# Patient Record
Sex: Female | Born: 1950
Health system: Southern US, Community
[De-identification: ages and names within clinical notes are randomized; demographics above are authoritative.]

## PROBLEM LIST (undated history)

## (undated) DIAGNOSIS — E1129 Type 2 diabetes mellitus with other diabetic kidney complication: Secondary | ICD-10-CM

## (undated) DIAGNOSIS — R809 Proteinuria, unspecified: Secondary | ICD-10-CM

## (undated) DIAGNOSIS — I110 Hypertensive heart disease with heart failure: Secondary | ICD-10-CM

## (undated) DIAGNOSIS — I509 Heart failure, unspecified: Secondary | ICD-10-CM

## (undated) DIAGNOSIS — Z950 Presence of cardiac pacemaker: Secondary | ICD-10-CM

## (undated) DIAGNOSIS — G5600 Carpal tunnel syndrome, unspecified upper limb: Secondary | ICD-10-CM

## (undated) DIAGNOSIS — M1990 Unspecified osteoarthritis, unspecified site: Secondary | ICD-10-CM

## (undated) DIAGNOSIS — Z7901 Long term (current) use of anticoagulants: Secondary | ICD-10-CM

## (undated) DIAGNOSIS — M779 Enthesopathy, unspecified: Secondary | ICD-10-CM

## (undated) DIAGNOSIS — J302 Other seasonal allergic rhinitis: Secondary | ICD-10-CM

## (undated) DIAGNOSIS — G4733 Obstructive sleep apnea (adult) (pediatric): Secondary | ICD-10-CM

## (undated) DIAGNOSIS — G4730 Sleep apnea, unspecified: Secondary | ICD-10-CM

## (undated) DIAGNOSIS — F419 Anxiety disorder, unspecified: Secondary | ICD-10-CM

## (undated) DIAGNOSIS — K439 Ventral hernia without obstruction or gangrene: Secondary | ICD-10-CM

## (undated) DIAGNOSIS — K591 Functional diarrhea: Secondary | ICD-10-CM

## (undated) DIAGNOSIS — N183 Chronic kidney disease, stage 3 (moderate): Secondary | ICD-10-CM

## (undated) DIAGNOSIS — J40 Bronchitis, not specified as acute or chronic: Secondary | ICD-10-CM

## (undated) DIAGNOSIS — E669 Obesity, unspecified: Secondary | ICD-10-CM

## (undated) DIAGNOSIS — I10 Essential (primary) hypertension: Secondary | ICD-10-CM

## (undated) DIAGNOSIS — J449 Chronic obstructive pulmonary disease, unspecified: Secondary | ICD-10-CM

## (undated) DIAGNOSIS — N951 Menopausal and female climacteric states: Secondary | ICD-10-CM

## (undated) DIAGNOSIS — R609 Edema, unspecified: Secondary | ICD-10-CM

## (undated) DIAGNOSIS — K210 Gastro-esophageal reflux disease with esophagitis: Secondary | ICD-10-CM

## (undated) DIAGNOSIS — E119 Type 2 diabetes mellitus without complications: Secondary | ICD-10-CM

## (undated) DIAGNOSIS — E785 Hyperlipidemia, unspecified: Secondary | ICD-10-CM

## (undated) DIAGNOSIS — I5032 Chronic diastolic (congestive) heart failure: Secondary | ICD-10-CM

## (undated) DIAGNOSIS — J309 Allergic rhinitis, unspecified: Secondary | ICD-10-CM

## (undated) DIAGNOSIS — K589 Irritable bowel syndrome without diarrhea: Secondary | ICD-10-CM

## (undated) DIAGNOSIS — I4891 Unspecified atrial fibrillation: Secondary | ICD-10-CM

## (undated) DIAGNOSIS — E039 Hypothyroidism, unspecified: Secondary | ICD-10-CM

## (undated) DIAGNOSIS — D649 Anemia, unspecified: Secondary | ICD-10-CM

## (undated) DIAGNOSIS — E663 Overweight: Secondary | ICD-10-CM

## (undated) DIAGNOSIS — M792 Neuralgia and neuritis, unspecified: Secondary | ICD-10-CM

## (undated) DIAGNOSIS — F17200 Nicotine dependence, unspecified, uncomplicated: Secondary | ICD-10-CM

## (undated) DIAGNOSIS — H8110 Benign paroxysmal vertigo, unspecified ear: Secondary | ICD-10-CM

## (undated) DIAGNOSIS — Z8719 Personal history of other diseases of the digestive system: Secondary | ICD-10-CM

## (undated) DIAGNOSIS — R0789 Other chest pain: Secondary | ICD-10-CM

## (undated) DIAGNOSIS — S82843A Displaced bimalleolar fracture of unspecified lower leg, initial encounter for closed fracture: Secondary | ICD-10-CM

## (undated) DIAGNOSIS — M545 Low back pain: Secondary | ICD-10-CM

## (undated) DIAGNOSIS — I480 Paroxysmal atrial fibrillation: Secondary | ICD-10-CM

## (undated) DIAGNOSIS — F331 Major depressive disorder, recurrent, moderate: Secondary | ICD-10-CM

## (undated) DIAGNOSIS — J329 Chronic sinusitis, unspecified: Secondary | ICD-10-CM

## (undated) DIAGNOSIS — E1165 Type 2 diabetes mellitus with hyperglycemia: Secondary | ICD-10-CM

## (undated) DIAGNOSIS — M546 Pain in thoracic spine: Secondary | ICD-10-CM

## (undated) DIAGNOSIS — I495 Sick sinus syndrome: Secondary | ICD-10-CM

## (undated) DIAGNOSIS — K219 Gastro-esophageal reflux disease without esophagitis: Secondary | ICD-10-CM

## (undated) DIAGNOSIS — K2900 Acute gastritis without bleeding: Secondary | ICD-10-CM

## (undated) DIAGNOSIS — R51 Headache: Secondary | ICD-10-CM

## (undated) HISTORY — DX: Neuralgia and neuritis, unspecified: M79.2

## (undated) HISTORY — DX: Personal history of other diseases of the digestive system: Z87.19

## (undated) HISTORY — DX: Type 2 diabetes mellitus without complications: E11.9

## (undated) HISTORY — DX: Chronic diastolic (congestive) heart failure: I50.32

## (undated) HISTORY — DX: Enthesopathy, unspecified: M77.9

## (undated) HISTORY — DX: Hyperlipidemia, unspecified: E78.5

## (undated) HISTORY — DX: Displaced bimalleolar fracture of unspecified lower leg, initial encounter for closed fracture: S82.843A

## (undated) HISTORY — DX: Overweight: E66.3

## (undated) HISTORY — DX: Essential (primary) hypertension: I10

## (undated) HISTORY — PX: CHOLECYSTECTOMY: SHX55

## (undated) HISTORY — DX: Sick sinus syndrome: I49.5

## (undated) HISTORY — DX: Obesity, unspecified: E66.9

## (undated) HISTORY — DX: Benign paroxysmal vertigo, unspecified ear: H81.10

## (undated) HISTORY — DX: Paroxysmal atrial fibrillation: I48.0

## (undated) HISTORY — DX: Hypothyroidism, unspecified: E03.9

## (undated) HISTORY — DX: Low back pain: M54.5

## (undated) HISTORY — DX: Chronic obstructive pulmonary disease, unspecified: J44.9

## (undated) HISTORY — DX: Long term (current) use of anticoagulants: Z79.01

## (undated) HISTORY — DX: Bronchitis, not specified as acute or chronic: J40

## (undated) HISTORY — PX: TUBAL LIGATION: SHX77

## (undated) HISTORY — DX: Chronic kidney disease, stage 3 (moderate): N18.3

## (undated) HISTORY — DX: Functional diarrhea: K59.1

## (undated) HISTORY — DX: Pain in thoracic spine: M54.6

## (undated) HISTORY — DX: Allergic rhinitis, unspecified: J30.9

## (undated) HISTORY — PX: CATARACT EXTRACTION W/ INTRAOCULAR LENS IMPLANT: SHX1309

## (undated) HISTORY — DX: Type 2 diabetes mellitus with hyperglycemia: E11.65

## (undated) HISTORY — DX: Major depressive disorder, recurrent, moderate: F33.1

## (undated) HISTORY — DX: Acute gastritis without bleeding: K29.00

## (undated) HISTORY — DX: Chronic sinusitis, unspecified: J32.9

## (undated) HISTORY — DX: Anxiety disorder, unspecified: F41.9

## (undated) HISTORY — DX: Edema, unspecified: R60.9

## (undated) HISTORY — PX: HERNIA REPAIR: SHX51

## (undated) HISTORY — DX: Menopausal and female climacteric states: N95.1

## (undated) HISTORY — DX: Hypertensive heart disease with heart failure: I11.0

## (undated) HISTORY — DX: Other chest pain: R07.89

## (undated) HISTORY — DX: Irritable bowel syndrome without diarrhea: K58.9

## (undated) HISTORY — DX: Gastro-esophageal reflux disease with esophagitis: K21.0

## (undated) HISTORY — DX: Anemia, unspecified: D64.9

## (undated) HISTORY — DX: Headache: R51

## (undated) HISTORY — PX: CARPAL TUNNEL RELEASE: SHX101

## (undated) HISTORY — DX: Presence of cardiac pacemaker: Z95.0

## (undated) HISTORY — DX: Obstructive sleep apnea (adult) (pediatric): G47.33

## (undated) HISTORY — DX: Nicotine dependence, unspecified, uncomplicated: F17.200

## (undated) HISTORY — DX: Type 2 diabetes mellitus with other diabetic kidney complication: E11.29

## (undated) HISTORY — DX: Unspecified osteoarthritis, unspecified site: M19.90

## (undated) HISTORY — DX: Other seasonal allergic rhinitis: J30.2

## (undated) HISTORY — DX: Ventral hernia without obstruction or gangrene: K43.9

## (undated) HISTORY — DX: Carpal tunnel syndrome, unspecified upper limb: G56.00

## (undated) HISTORY — DX: Unspecified atrial fibrillation: I48.91

## (undated) HISTORY — DX: Proteinuria, unspecified: R80.9

## (undated) HISTORY — PX: TOTAL HIP ARTHROPLASTY: SHX124

---

## 1998-12-25 ENCOUNTER — Other Ambulatory Visit: Admission: RE | Admit: 1998-12-25 | Discharge: 1998-12-25 | Payer: Self-pay | Admitting: Gynecology

## 1999-01-12 ENCOUNTER — Other Ambulatory Visit: Admission: RE | Admit: 1999-01-12 | Discharge: 1999-01-12 | Payer: Self-pay | Admitting: Gynecology

## 1999-01-12 ENCOUNTER — Encounter (INDEPENDENT_AMBULATORY_CARE_PROVIDER_SITE_OTHER): Payer: Self-pay | Admitting: Specialist

## 2003-06-26 ENCOUNTER — Ambulatory Visit (HOSPITAL_COMMUNITY): Admission: RE | Admit: 2003-06-26 | Discharge: 2003-06-26 | Payer: Self-pay | Admitting: Orthopedic Surgery

## 2003-12-06 ENCOUNTER — Observation Stay (HOSPITAL_COMMUNITY): Admission: RE | Admit: 2003-12-06 | Discharge: 2003-12-07 | Payer: Self-pay | Admitting: Orthopedic Surgery

## 2008-11-27 ENCOUNTER — Ambulatory Visit: Payer: Self-pay | Admitting: Cardiovascular Disease

## 2008-11-27 ENCOUNTER — Inpatient Hospital Stay (HOSPITAL_COMMUNITY): Admission: AD | Admit: 2008-11-27 | Discharge: 2008-11-29 | Payer: Self-pay | Admitting: Cardiology

## 2008-11-27 DIAGNOSIS — I480 Paroxysmal atrial fibrillation: Secondary | ICD-10-CM

## 2008-11-27 DIAGNOSIS — I4891 Unspecified atrial fibrillation: Secondary | ICD-10-CM

## 2008-11-27 HISTORY — DX: Unspecified atrial fibrillation: I48.91

## 2008-11-27 HISTORY — DX: Paroxysmal atrial fibrillation: I48.0

## 2008-11-28 ENCOUNTER — Encounter: Payer: Self-pay | Admitting: Cardiology

## 2008-12-03 ENCOUNTER — Ambulatory Visit: Payer: Self-pay | Admitting: Cardiology

## 2008-12-03 ENCOUNTER — Encounter: Payer: Self-pay | Admitting: Cardiology

## 2008-12-09 ENCOUNTER — Ambulatory Visit: Payer: Self-pay | Admitting: Internal Medicine

## 2008-12-16 ENCOUNTER — Ambulatory Visit: Payer: Self-pay | Admitting: Cardiovascular Disease

## 2008-12-23 ENCOUNTER — Ambulatory Visit: Payer: Self-pay | Admitting: Cardiology

## 2008-12-24 ENCOUNTER — Ambulatory Visit: Payer: Self-pay | Admitting: Cardiovascular Disease

## 2009-01-02 ENCOUNTER — Telehealth: Payer: Self-pay | Admitting: Cardiovascular Disease

## 2009-01-02 ENCOUNTER — Ambulatory Visit: Payer: Self-pay | Admitting: Cardiology

## 2009-01-03 ENCOUNTER — Ambulatory Visit: Payer: Self-pay | Admitting: Internal Medicine

## 2009-01-03 ENCOUNTER — Ambulatory Visit (HOSPITAL_COMMUNITY): Admission: RE | Admit: 2009-01-03 | Discharge: 2009-01-03 | Payer: Self-pay | Admitting: Internal Medicine

## 2009-01-07 ENCOUNTER — Ambulatory Visit: Payer: Self-pay | Admitting: Cardiovascular Disease

## 2009-01-22 ENCOUNTER — Ambulatory Visit: Payer: Self-pay | Admitting: Cardiovascular Disease

## 2009-01-31 ENCOUNTER — Ambulatory Visit: Payer: Self-pay | Admitting: Cardiovascular Disease

## 2009-01-31 LAB — CONVERTED CEMR LAB: POC INR: 1.7

## 2009-02-14 ENCOUNTER — Ambulatory Visit: Payer: Self-pay | Admitting: Cardiology

## 2009-02-19 ENCOUNTER — Ambulatory Visit: Payer: Self-pay | Admitting: Cardiovascular Disease

## 2009-02-20 ENCOUNTER — Telehealth: Payer: Self-pay | Admitting: Cardiovascular Disease

## 2009-03-07 ENCOUNTER — Ambulatory Visit: Payer: Self-pay | Admitting: Internal Medicine

## 2009-03-07 ENCOUNTER — Encounter (INDEPENDENT_AMBULATORY_CARE_PROVIDER_SITE_OTHER): Payer: Self-pay | Admitting: Cardiology

## 2009-03-07 ENCOUNTER — Ambulatory Visit: Payer: Self-pay | Admitting: Cardiovascular Disease

## 2009-03-07 LAB — CONVERTED CEMR LAB: POC INR: 2.9

## 2009-03-21 ENCOUNTER — Ambulatory Visit: Payer: Self-pay | Admitting: Cardiovascular Disease

## 2009-04-04 ENCOUNTER — Telehealth (INDEPENDENT_AMBULATORY_CARE_PROVIDER_SITE_OTHER): Payer: Self-pay | Admitting: Pharmacist

## 2009-04-04 ENCOUNTER — Ambulatory Visit: Payer: Self-pay | Admitting: Cardiology

## 2009-04-04 LAB — CONVERTED CEMR LAB: POC INR: 2.2

## 2009-06-11 ENCOUNTER — Telehealth: Payer: Self-pay | Admitting: Cardiovascular Disease

## 2009-06-20 ENCOUNTER — Ambulatory Visit: Payer: Self-pay | Admitting: Cardiovascular Disease

## 2009-09-02 ENCOUNTER — Ambulatory Visit: Payer: Self-pay | Admitting: Cardiovascular Disease

## 2010-02-24 NOTE — Progress Notes (Signed)
  Call back at EKG done in office [id number]-notified Dr. Freida Busman, Advised to continue Metoprolol 50mg  Bid and call office if any palpitations.

## 2010-02-24 NOTE — Progress Notes (Signed)
  Phone Note Call from Patient   Caller: Patient Call For: Coumadin Clinic Summary of Call: Pt calls to inform us of a new abx.  She has been prescribed a 10 day course of Cefdinir.  She is also taking Norel SR, a combination product for cold symptoms.  I do not forsee these medications being an issue with her coumadin. She has been instructed to continue as prescribed and to follow-up in one month.   Initial call taken by: Eda Keys, PharmD

## 2010-02-24 NOTE — Medication Information (Signed)
Summary: rov.m  Anticoagulant Therapy  Managed by: Inactive Referring MD: Raynelle Bring, MD Supervising MD: Myrtis Ser MD, Tinnie Gens Indication 1: Atrial Fibrillation Lab Used: [facility] [place] Site: [address] INR POC 2.2 INR RANGE 2.0-3.0  Dietary changes: no    Health status changes: no    Bleeding/hemorrhagic complications: no    Recent/future hospitalizations: no    Any changes in medication regimen? yes       Details: Currently on abx for sinus congestion/infection.  Started on Wed 04/02/09.    Recent/future dental: no  Any missed doses?: no       Is patient compliant with meds? yes      Comments: Pt is going to start following PT/INR in  with her primary MD Dr Keturah Barre at [facility].  He has agreed to follow.    Allergies (verified): 1)  ! Vicodin  Anticoagulation Management History:      The patient is taking warfarin and comes in today for a routine follow up visit.  Negative risk factors for bleeding include an age less than 91 years old.  The bleeding index is 'low risk'.  Negative CHADS2 values include Age > 52 years old.  Anticoagulation responsible provider: Myrtis Ser MD, Tinnie Gens.  INR POC: 2.2.  Cuvette Lot#: [id number].  Exp: 05/2010.    Anticoagulation Management Assessment/Plan:      The patient's current anticoagulation dose is Warfarin sodium 5 mg tabs: Use as directed by Anticoagulation Clinic.  The target INR is 2.0-3.0.  The next INR is due 05/02/2009.  Anticoagulation instructions were given to patient.  Results were reviewed/authorized by Inactive.  She was notified by Cloyde Reams RN.         Prior Anticoagulation Instructions: INR 2.9  Take 1 tab tomorrow, then resume 1 tab daily except 1.5 tabs each Tuesday, Thursday, Saturday.  Recheck in 4 weeks.    Current Anticoagulation Instructions: INR 2.2  Continue on same dosage 1 tablet daily except 1.5 tablets on Tuesdays, Thursdays, and Saturdays. Call us with the name of abx  you are currently taking.   Recheck in 4 weeks.

## 2010-02-24 NOTE — Letter (Signed)
Summary: Handout Printed  Printed Handout:  - Coumadin Instructions-w/out Meds 

## 2010-02-24 NOTE — Medication Information (Signed)
Summary: ROV/LB  Anticoagulant Therapy  Managed by: Cloyde Reams, RN, BSN Referring MD: Raynelle Bring, MD Supervising MD: Eden Emms MD, Theron Arista Indication 1: Atrial Fibrillation Lab Used: [facility] [place] Site: [address] INR POC 1.7 INR RANGE 2.0-3.0  Dietary changes: no    Health status changes: no    Bleeding/hemorrhagic complications: no    Recent/future hospitalizations: no    Any changes in medication regimen? no    Recent/future dental: no  Any missed doses?: yes     Details: Missed 5mg  Sunday. Took an extra 2.5mg  on Monday and Wednesday to make up for missed dose.  Is patient compliant with meds? yes       Allergies: 1)  ! Vicodin  Anticoagulation Management History:      The patient is taking warfarin and comes in today for a routine follow up visit.  Negative risk factors for bleeding include an age less than 8 years old.  The bleeding index is 'low risk'.  Negative CHADS2 values include Age > 54 years old.  Anticoagulation responsible provider: Eden Emms MD, Theron Arista.  INR POC: 1.7.  Cuvette Lot#: [id number].  Exp: 02/2010.    Anticoagulation Management Assessment/Plan:      The patient's current anticoagulation dose is Warfarin sodium 5 mg tabs: Use as directed by Anticoagulation Clinic.  The target INR is 2.0-3.0.  The next INR is due 02/14/2009.  Anticoagulation instructions were given to patient.  Results were reviewed/authorized by Cloyde Reams, RN, BSN.  She was notified by Lew Dawes, PharmD Candidate.         Prior Anticoagulation Instructions: INR 2.1 CONTINUE TAKING 1 TABLET EVERYDAY EXCEPT TAKE 1.5 TABLETS ON TUESDAYS AND SATURDAYS.  Current Anticoagulation Instructions: INR 1.7  Take 1.5 tablets today then increase dose to1 tablet daily except 1.5 tablets Tuesday, Thursday, and Saturday. Recheck in 2 weeks.

## 2010-02-24 NOTE — Progress Notes (Signed)
Summary: pres refill  Phone Note Call from Patient Message from:  Patient on Jun 11, 2009 11:03 AM  Initial call taken by: Park Breed,  Jun 11, 2009 11:04 AM Summary of Call: pt needs prescription for Metoprolol 50 mg two times a day called to [facility] [phone]  Tab/Tar Initial call taken by: Park Breed,  Jun 11, 2009 11:05 AM  Follow-up for Phone Call        Rx Called In: Rx faxed in to [facility] for Metoprolol Tartrate 50mg  Bid #[id number] Follow-up by: Dessie Coma LPN

## 2010-02-24 NOTE — Medication Information (Signed)
Summary: rov/ewj  Anticoagulant Therapy  Managed by: Shelby Dubin, PharmD, BCPS, CPP Referring MD: Raynelle Bring, MD Supervising MD: Tenny Craw MD, Gunnar Fusi Indication 1: Atrial Fibrillation Lab Used: [facility] [place] Site: [address] INR POC 2.9 INR RANGE 2.0-3.0  Dietary changes: no    Health status changes: no    Bleeding/hemorrhagic complications: no    Recent/future hospitalizations: no    Any changes in medication regimen? no    Recent/future dental: no  Any missed doses?: no       Is patient compliant with meds? yes      Comments: Monitor today...  Allergies (verified): 1)  ! Vicodin  Anticoagulation Management History:      The patient is taking warfarin and comes in today for a routine follow up visit.  Negative risk factors for bleeding include an age less than 49 years old.  The bleeding index is 'low risk'.  Negative CHADS2 values include Age > 84 years old.  Anticoagulation responsible provider: Tenny Craw MD, Gunnar Fusi.  INR POC: 2.9.  Cuvette Lot#: [id number].  Exp: 04/2010.    Anticoagulation Management Assessment/Plan:      The patient's current anticoagulation dose is Warfarin sodium 5 mg tabs: Use as directed by Anticoagulation Clinic.  The target INR is 2.0-3.0.  The next INR is due 04/04/2009.  Anticoagulation instructions were given to patient.  Results were reviewed/authorized by Shelby Dubin, PharmD, BCPS, CPP.  She was notified by Shelby Dubin PharmD, BCPS, CPP.         Prior Anticoagulation Instructions: INR 2.4  Continue on same dosage 1 tablet daily except 1.5 tablets on Tuesdays, Thursdays, and Saturdays.   Recheck in 3 weeks.    Current Anticoagulation Instructions: INR 2.9  Take 1 tab tomorrow, then resume 1 tab daily except 1.5 tabs each Tuesday, Thursday, Saturday.  Recheck in 4 weeks.

## 2010-02-24 NOTE — Procedures (Signed)
Summary: summary report  summary report   Imported By: Mirna Mires 03/11/2009 09:29:59  _____________________________________________________________________  External Attachment:    Type:   Image     Comment:   External Document

## 2010-02-24 NOTE — Medication Information (Signed)
Summary: rov/eh  Anticoagulant Therapy  Managed by: Cloyde Reams, RN, BSN Referring MD: Raynelle Bring, MD Supervising MD: Daleen Squibb MD, Maisie Fus Indication 1: Atrial Fibrillation Lab Used: [facility] [place] Site: [address] INR POC 2.4 INR RANGE 2.0-3.0  Dietary changes: no    Health status changes: no    Bleeding/hemorrhagic complications: no    Recent/future hospitalizations: no    Any changes in medication regimen? yes       Details: Incr Metformin to 1000mg  bid.  Recent/future dental: no  Any missed doses?: no       Is patient compliant with meds? yes       Allergies (verified): 1)  ! Vicodin  Anticoagulation Management History:      The patient is taking warfarin and comes in today for a routine follow up visit.  Negative risk factors for bleeding include an age less than 69 years old.  The bleeding index is 'low risk'.  Negative CHADS2 values include Age > 88 years old.  Anticoagulation responsible provider: Daleen Squibb MD, Maisie Fus.  INR POC: 2.4.  Cuvette Lot#: [id number].  Exp: 04/2010.    Anticoagulation Management Assessment/Plan:      The patient's current anticoagulation dose is Warfarin sodium 5 mg tabs: Use as directed by Anticoagulation Clinic.  The target INR is 2.0-3.0.  The next INR is due 03/07/2009.  Anticoagulation instructions were given to patient.  Results were reviewed/authorized by Cloyde Reams, RN, BSN.  She was notified by Cloyde Reams RN.         Prior Anticoagulation Instructions: INR 1.7  Take 1.5 tablets today then increase dose to1 tablet daily except 1.5 tablets Tuesday, Thursday, and Saturday. Recheck in 2 weeks.  Current Anticoagulation Instructions: INR 2.4  Continue on same dosage 1 tablet daily except 1.5 tablets on Tuesdays, Thursdays, and Saturdays.   Recheck in 3 weeks.

## 2010-04-28 LAB — PROTIME-INR
INR: 1.18 (ref 0.00–1.49)
Prothrombin Time: 14.9 seconds (ref 11.6–15.2)

## 2010-04-28 LAB — BASIC METABOLIC PANEL
BUN: 22 mg/dL (ref 6–23)
CO2: 24 mEq/L (ref 19–32)
Calcium: 8.3 mg/dL — ABNORMAL LOW (ref 8.4–10.5)
Chloride: 105 mEq/L (ref 96–112)
Creatinine, Ser: 0.84 mg/dL (ref 0.4–1.2)
Glucose, Bld: 120 mg/dL — ABNORMAL HIGH (ref 70–99)
Potassium: 3.3 mEq/L — ABNORMAL LOW (ref 3.5–5.1)
Sodium: 137 mEq/L (ref 135–145)

## 2010-04-28 LAB — CBC: RBC: 4.41 MIL/uL (ref 3.87–5.11)

## 2010-04-29 LAB — CBC
HCT: 38.3 % (ref 36.0–46.0)
Hemoglobin: 13.5 g/dL (ref 12.0–15.0)
Hemoglobin: 13.9 g/dL (ref 12.0–15.0)
MCHC: 34.9 g/dL (ref 30.0–36.0)
MCHC: 35.2 g/dL (ref 30.0–36.0)
MCV: 90.9 fL (ref 78.0–100.0)
MCV: 91.6 fL (ref 78.0–100.0)
Platelets: 217 10*3/uL (ref 150–400)
Platelets: 251 10*3/uL (ref 150–400)
RBC: 4.2 MIL/uL (ref 3.87–5.11)
RBC: 4.35 MIL/uL (ref 3.87–5.11)
RDW: 12.9 % (ref 11.5–15.5)
RDW: 13 % (ref 11.5–15.5)
WBC: 8 10*3/uL (ref 4.0–10.5)

## 2010-04-29 LAB — HEPARIN LEVEL (UNFRACTIONATED)
Heparin Unfractionated: 0.19 IU/mL — ABNORMAL LOW (ref 0.30–0.70)
Heparin Unfractionated: 0.65 IU/mL (ref 0.30–0.70)

## 2010-04-29 LAB — COMPREHENSIVE METABOLIC PANEL
ALT: 20 U/L (ref 0–35)
Albumin: 3.6 g/dL (ref 3.5–5.2)
Alkaline Phosphatase: 106 U/L (ref 39–117)
CO2: 22 mEq/L (ref 19–32)
Calcium: 9 mg/dL (ref 8.4–10.5)
GFR calc Af Amer: >60 mL/min (ref >60)
Glucose, Bld: 243 mg/dL — ABNORMAL HIGH (ref 70–99)
Potassium: 3.4 mEq/L — ABNORMAL LOW (ref 3.5–5.1)
Sodium: 137 mEq/L (ref 135–145)
Total Bilirubin: 1.3 mg/dL — ABNORMAL HIGH (ref 0.3–1.2)
Total Protein: 6.4 g/dL (ref 6.0–8.3)

## 2010-04-29 LAB — GLUCOSE, CAPILLARY
Glucose-Capillary: 138 mg/dL — ABNORMAL HIGH (ref 70–99)
Glucose-Capillary: 199 mg/dL — ABNORMAL HIGH (ref 70–99)

## 2010-04-29 LAB — PROTIME-INR
Prothrombin Time: 13 seconds (ref 11.6–15.2)
Prothrombin Time: 13.7 seconds (ref 11.6–15.2)
Prothrombin Time: 14.2 seconds (ref 11.6–15.2)

## 2010-04-29 LAB — CARDIAC PANEL(CRET KIN+CKTOT+MB+TROPI)
CK, MB: 2.6 ng/mL (ref 0.3–4.0)
Relative Index: 2.1 (ref 0.0–2.5)
Troponin I: 0.01 ng/mL (ref 0.00–0.06)

## 2010-04-29 LAB — BASIC METABOLIC PANEL
BUN: 16 mg/dL (ref 6–23)
Calcium: 8.5 mg/dL (ref 8.4–10.5)
Creatinine, Ser: 0.81 mg/dL (ref 0.4–1.2)
GFR calc Af Amer: >60 mL/min (ref >60)

## 2010-04-29 LAB — TSH: TSH: 3.868 u[IU]/mL (ref 0.350–4.500)

## 2010-04-29 LAB — APTT: aPTT: 26 seconds (ref 24–37)

## 2010-06-09 NOTE — Assessment & Plan Note (Signed)
[facility]                        [place] CARDIOLOGY OFFICE NOTE   NAME:Carolyn [name], Carolyn [name]                     MRN:          [id number]  DATE:02/19/2009                            DOB:          07-Jan-1951    PROBLEM LIST:  1. Paroxysmal atrial fibrillation.  2. Hypertension.  3. Type 2 diabetes.  4. Hyperlipidemia.  5. Tobacco use.  6. Obesity.  7. Hypothyroidism after thyroid ablation in 2005.  8. History of carpal tunnel syndrome.   INTERVAL HISTORY:  The patient states since her last visit, she has been  doing well.  She feels palpitations maybe 1 time a week.  She states  sometimes it can last a few minutes to several hours.  Today in clinic,  she is feeling some palpitations.  She denies any chest comfort other  than the palpitations and has been compliant with her medications  including her Coumadin.   PHYSICAL EXAMINATION:  VITAL SIGNS:  Today, her blood pressure is  156/104 in the right arm and 147/93 in the left arm, pulse is 76, sating  97% on room air, and she weighs 236 pounds.  GENERAL:  She is in no acute distress.  HEENT:  Normocephalic, atraumatic.  HEART:  Irregularly irregular and tachycardia.  No murmur or rub.  LUNGS:  Clear bilaterally.  ABDOMEN:  Soft, nontender, nondistended.  EXTREMITIES:  Without edema.  SKIN:  Warm and dry.    EKG taken today in clinic demonstrates atrial fibrillation with  ventricular response of 138 beats per minute.   ASSESSMENT AND PLAN:  The patient presented in atrial fibrillation with  rapid ventricular response.  Evidently, the patient did not take her  Toprol dose this morning after work.  We gave her her dose of Toprol-XL  50 mg here in clinic and after half hour did not see a significant  improvement; therefore, an additional 50 mg of Toprol-XL was given.  The  patient's rhythm remained atrial fibrillation and heart rate decreased  to around 110 with occasional runs of sinus rhythm in  the 80s.  We  suggested the patient to go to the emergency room for more definitive  treatment of the tachycardia.  The patient declined this.  We recommend  the patient go home and rest.  She should take an additional 50 mg of  Toprol-XL this evening and start taking that dose b.i.d.  She will come  to clinic tomorrow after her work for a repeat EKG.  If the patient were  to feel any dizziness or chest discomfort, she will report the emergency  room before that time.  Regarding hypertension, she will continue on  current meds and her Toprol will be increased  which will help with rest of her uncontrolled hypertension.  Regarding  the patient's lipids, the patient is currently attending classes at  [employer] [employer] [facility] and is having her medications titrated by her  primary care physician.     [name], MD  Electronically Signed    SGA/MedQ  DD: 02/19/2009  DT: 02/20/2009  Job #: [id number]

## 2010-06-09 NOTE — Assessment & Plan Note (Signed)
[facility]                        [place] CARDIOLOGY OFFICE NOTE   NAME:[name] [name] BURTIS                     MRN:          [id number]  DATE:11/27/2008                            DOB:          1950/08/24    HISTORY AND PHYSICAL   CHIEF COMPLAINT:  Shortness of breath and chest pain.   HISTORY OF PRESENT ILLNESS:  [name] [name] is a 60 year old white female  with past medical history significant for hyperthyroidism status post  thyroid ablation in 2005 with resulting hypothyroidism who is presenting  with a 1 month history of increased dyspnea and intermittent chest  discomfort.  The patient states that for the past month whether she is  exerting herself or at rest she has had increased levels of shortness of  breath.  She occasionally has palpitations and some episodes of  lightheadedness but denies any syncopal episodes.  The patient states  that over the past week or so she has also developed some chest  discomfort that is intermittent over the course of the day.  There is no  radiation of the chest discomfort although it is associated with the  dyspnea.  The patient states that around the time of her thyroid  ablation in 2004 or 2005 she had some issues with atrial fibrillation,  however, she is not aware of the arrhythmia recurring since then.  Approximately 1 week ago the patient reported to the emergency room with  these symptoms and was found to be in atrial fibrillation with rapid  ventricular response.  They performed a CT scan of the chest and ruled  out pulmonary embolism and set her up with an outpatient appointment.  She has not been on any rate controlling agents nor has she been on any  anticoagulants other than aspirin.   PAST MEDICAL HISTORY:  As above in the HPI.  In addition, the patient  has had a cholecystectomy, left hip surgery and has carpal tunnel  syndrome.   SOCIAL HISTORY:  She has a history of tobacco but quit smoking  a couple  weeks ago.  Does not drink significant amounts of alcohol.   FAMILY HISTORY:  Is positive for coronary disease but not for premature  coronary disease.   ALLERGIES:  The patient is allergic to HYDROCODONE.   MEDICATIONS:  1. Aspirin 81 mg daily.  2. Lexapro 20 mg daily.  3. Synthroid 150 mcg daily.  4. Omeprazole 20 mg daily.   REVIEW OF SYSTEMS:  Positive for lower extremity edema that has been  present for over a year.  She also endorses increased fatigue.  Other  systems as in HPI, otherwise negative.   PHYSICAL EXAM:  VITAL SIGNS:  Blood pressure is 156/82, heart rate 126,  her weight is 240 pounds and she is satting 98% on room air.  GENERAL:  She is in no acute distress.  HEENT:  Nonfocal.  NECK:  Neck is supple.  There is no JVD.  There are no carotid bruits.  There is no thyromegaly.  HEART:  Regular irregular and tachycardic without murmur, rub or gallop.  LUNGS:  Clear bilaterally.  ABDOMEN:  Soft, nontender, nondistended.  EXTREMITIES:  There is 1+ bilateral lower extremity edema.  SKIN:  There is some mild erythema on the distal portion of her lower  extremities.  Skin is otherwise warm and dry.  NEUROLOGICAL:  Nonfocal.  PSYCHIATRIC:  The patient is appropriate with normal levels of insight.  PULSES:  The patient has 2+ bilateral carotid and radial pulses.  MUSCULOSKELETAL:  The patient has 5/5 bilateral upper and lower  extremity strength.   LABS:  From the end of October shows a BMP with a sodium of 140,  potassium 3.7, chloride 107, CO2 25, BUN 10, creatinine 0.8, glucose  122.  LFTs were completely within normal limits other than a mildly  elevated alkaline phosphatase at 135.  Her CK was 196 and CK-MB was 2.4.  CBC showed a white count of 8.5, hemoglobin of 14, hematocrit of 41,  platelet count of 254.  UA was within normal limits.  TSH was 8.87 and  her total T4 was 7.9, troponin was 0.01.  EKG from today independently  interpreted by myself  demonstrates a narrow complex tachycardia with a  ventricular rate of 123 beats per minute.  There is organized atrial  activity at a rate of approximately 300 atrial beats per minute that  likely represents atrial flutter with variable block.   ASSESSMENT:  A 60 year old white female with hypothyroidism presenting  with symptomatic atrial flutter (and probable fibrillation) for at least  1 week and probably 1 month.  It is not clear whether this is paroxysmal  atrial fibrillation or whether she has maintained the rhythm throughout  this time period.  The patient does have thyroid disease as a possible  underlying etiology.   PLAN:  The patient will be directly admitted to [facility] for  evaluation and treatment site of this rhythm.  Dr. Myrtis Ser will be the  accepting physician.  She will be ruled out for myocardial infarction  and a TSH will be checked.  We will continue her above medications.  In  addition, we will start her on Lopressor 25 mg p.o. q.6 h and titrate  this for appropriate rate control.  If the patient remains atrial fib or  atrial flutter throughout her inpatient stay we will institute therapy  with Coumadin with the hopes of cardioverting her in 3-4 weeks.  We may  also have EP see the patient to see if she would be a candidate for  ablation.  The patient is having one of her relatives drive her to the  hospital.     [name], MD  Electronically Signed    SGA/MedQ  DD: 11/27/2008  DT: 11/27/2008  Job #: [id number]

## 2010-06-09 NOTE — Assessment & Plan Note (Signed)
[facility]                        [place] CARDIOLOGY OFFICE NOTE   NAME:Carolyn [name], Carolyn [name]                     MRN:          [id number]  DATE:06/20/2009                            DOB:          1950/08/23    PROBLEM LIST:  1. Paroxysmal atrial fibrillation/flutter.  2. Hypertension.  3. Type 2 diabetes.  4. Hyperlipidemia.  5. Tobacco use.  6. Obesity.  7. Hypothyroidism.  8. History of carpal tunnel syndrome.   INTERVAL HISTORY:  The patient states that she has been doing well since  her last visit, she has not had any episodes of palpitations or chest  pain.  She does have some issues with chronic back pain and some  occasional abdominal pain that may be secondary to irritable bowel.  She  has been compliant with her medications and is generally without  complaint.   PHYSICAL EXAMINATION:  VITAL SIGNS:  Her blood pressure is 118/71, pulse  61, sating 95% on room air, and she weighs 237 pounds which is 3 pounds  less than she weighed in February.  GENERAL:  No acute distress.  HEENT:  Normocephalic and atraumatic.  NECK:  Supple.  There is no carotid bruit.  No JVD.  HEART:  Regular rate and rhythm without murmur, rub, or gallop.  LUNGS:  Clear bilaterally.  ABDOMEN:  Soft, nontender, nondistended.  EXTREMITIES:  No edema.  SKIN:  Cool and dry.   LABORATORY DATA:  Labs were reviewed, dated May 29, 2009, total  cholesterol 175, HDL 38, triglycerides 279, and LDL of 81.  CBC was  within normal limits.  TSH was 0.314, this was followed by Dr. Sherral Hammers,  free T4 is 1.38 and a CMP was within normal limits except for mildly  elevated glucose of 113 and BUN 23 with a creatinine of 1.2.   ASSESSMENT AND PLAN:  1. Atrial fibrillation/flutter.  The patient does not appear to be      having any symptomatic episodes.  She will continue on Coumadin and      metoprolol.  2. Hypertension.  The patient's blood pressure is under excellent      control.   She should continue on HCTZ, benazepril, and metoprolol.  3. Hyperlipidemia.  The patient's LDL is reasonable.  However, she      continues to have elevated triglycerides.  We further discussed      about limiting carbohydrate intake and she will make more strides      in this direction.  If triglycerides remain elevated at the next      office visit, fibrates may be in order.  4. Diabetes as per Dr. Sherral Hammers.  5. Hypothyroidism as per Dr. Sherral Hammers.   PLAN:  The patient will follow up in 4 months' times.     [name], MD  Electronically Signed    SGA/MedQ  DD: 06/20/2009  DT: 06/20/2009  Job #: [id number]   cc:   Keturah Barre, M.D.

## 2010-06-09 NOTE — Assessment & Plan Note (Signed)
[facility]                        [place] CARDIOLOGY OFFICE NOTE   NAME:Carolyn [name], Carolyn [name]                     MRN:          [id number]  DATE:03/21/2009                            DOB:          May 29, 1950    PROBLEM LIST:  1. Paroxysmal atrial fibrillation.  2. Hypertension.  3. Type 2 diabetes.  4. Hyperlipidemia.  5. Tobacco use.  6. Obesity.  7. Hypothyroidism after thyroid ablation in 2005.  8. History of carpal tunnel.   INTERVAL HISTORY:  The patient states she has been getting along well  since her last visit.  She states in the past month she has had 1  episode where she felt her heart rate was high throughout the day.  Other than that, she denies any palpitations.  She has been attending  the diabetes education class and she has been attempting to make changes  in her dietary habits to address her diabetes, hypertriglyceridemia.  She had been having her INRs monitored and therefore stable.   On physical exam; her blood pressure is 123/85, pulse 66, sating 96% on  room air.  She weighs 240 pounds, which is 5 pounds more than she  weighed in January, but stable from November 2010.  General, no acute  distress.  HEENT; normocephalic, atraumatic.  Neck is supple.  Heart is  regular rate and rhythm without murmur, rub or gallop.  Lungs are clear  bilaterally.  Abdomen is soft and nontender.  Extremities have trace  lower extremity edema and skin is warm and dry.   EKG from today demonstrates sinus bradycardia with a ventricular rate of  57 beats per minute.  Review of the patient's Holter monitor that wore  for 48 hours shows normal sinus rhythm with sinus tachycardia with an  average heart rate of 17, minimum 52 maximum 121.  There is no atrial  fibrillation and there were occasional PACs.   ASSESSMENT AND PLAN:  1. Atrial fibrillation.  The patient appears to be having atrial      fibrillation infrequently.  Unfortunately, the date  that she did      have it she states that she feels that lasted most of the day.  She      should continue on metoprolol tartrate 50 mg twice a day as well as      Coumadin.  In the future if the paroxysms continue and are      affecting the patient's lifestyle, we would consider treatment with      Multaq.  2. Hypertension.  Blood pressure is under good control with      hydrochlorothiazide 25 mg, benazepril 10 mg daily as well as the      metoprolol.  3. Hyperlipidemia.  She is on simvastatin 40 mg daily.  She is      scheduled to have a blood work done in April Dr. Sherral Hammers office.      Of note, her last LDL was 84 in December 2010.  At that      time, her HDL was 39 and her triglyceride level was 263.  4. Diabetes as per  Dr. Sherral Hammers.  5. Hypothyroidism as per Dr. Sherral Hammers.     [name], MD  Electronically Signed    SGA/MedQ  DD: 03/21/2009  DT: 03/21/2009  Job #: [id number]

## 2010-06-09 NOTE — Assessment & Plan Note (Signed)
[facility]                        [place] CARDIOLOGY OFFICE NOTE   NAME:Carolyn [name], Carolyn [name]                     MRN:          [id number]  DATE:12/24/2008                            DOB:          1950/11/06    PROBLEM LIST:  1. Paroxysmal atrial fibrillation.  2. Hypertension.  3. Type 2 diabetes.  4. Hyperlipidemia.  5. Tobacco use.  6. Obesity.  7. Hypothyroidism after thyroid ablation in 2005.  8. History of carpal tunnel syndrome.   INTERVAL HISTORY:  At her last office visit, the patient was found to be  in AFib with rapid ventricular response and admitted to [facility] for  rate control.  There she was placed on diltiazem and spontaneously  converted to normal sinus rhythm.  She also underwent some diuresis for  and was felt to have a diastolic heart failure exacerbation.  The  patient states since her discharge, she has not had any major  complaints.  She has some tingling in her left hand that appears to be  new.  She also has had several episodes of sharp left-sided chest  discomfort that is new.  It does not radiate and there are no associated  symptoms.  She has been compliant with her medications including  Coumadin which she is having monitored at [facility] in  [place].  The patient made an inquiry to her insurance company  regarding Pradaxa therapy.  However, it would cost her approximately 60  dollars a month which is significantly more than the Coumadin, and she  states it is probably not worth the cost of it at this time.   REVIEW OF SYSTEMS:  As above, otherwise negative.   PHYSICAL EXAMINATION:  VITAL SIGNS:  Blood pressure is 108/73, pulse is  82, saturating 96% on room air.  She weighs 240 pounds.  GENERAL:  No acute distress.  HEENT:  Nonfocal.  NECK:  Supple.  HEART:  Regular rate and rhythm without murmur, rub, or gallop.  LUNGS:  Clear bilaterally.  ABDOMEN:  Soft, nontender, nondistended.  EXTREMITIES:   Without edema.  SKIN:  Warm and dry.   I reviewed the patient's hospital stay as above in the HPI.   ASSESSMENT AND PLAN:  1. Paroxysmal atrial fibrillation.  The patient is on metoprolol, and      she has not had any symptomatic palpitations.  She is currently      anticoagulated with Coumadin, and we will continue this for now.      In the future, the patient may consider switching to Pradaxa.  2. Hypertension.  Her blood pressure is under excellent control with      hydrochlorothiazide 25 mg daily, benazepril 5 mg daily, metoprolol      succinate 50 mg daily.  3. Hyperlipidemia.  The patient is on simvastatin 40 mg daily.  We      will order a fasting profile today.  4. Chest discomfort.  The patient's chest discomfort is atypical and      however, she does have significant risk factors for coronary artery      disease and therefore,  we will proceed with a Lexiscan Cardiolite      to rule out any significant inducible ischemia.  5. Diabetes.  The patient is on metformin 500 mg daily.  We will check      hemoglobin A1c but recommend that she see Dr. Sherral Hammers for titration      of the diabetes medications and diabetes education.  We will also      make a referral to nutritionist for diabetes education.  I am going      to see her back in 6 weeks' time.     [name], MD  Electronically Signed    SGA/MedQ  DD: 12/24/2008  DT: 12/25/2008  Job #: [id number]

## 2010-06-09 NOTE — Assessment & Plan Note (Signed)
[facility]                        [place] CARDIOLOGY OFFICE NOTE   NAME:Carolyn [name], Carolyn [name]                     MRN:          [id number]  DATE:09/02/2009                            DOB:          07/10/1950    PROBLEMS LIST:  1. Paroxysmal atrial fibrillation/flutter currently in sinus rhythm.  2. No history of coronary artery disease per cardiac cath in 2010.  3. Hypertension.  4. Type 2 diabetes.  5. Hyperlipidemia.  6. Tobacco use.  7. Obesity.  8. Hypothyroidism.  9. Carpal tunnel syndrome.   INTERIM HISTORY:  The patient is here today for a followup visit.  Overall, she has not had any significant episodes of palpitations.  It  seems like she has been maintaining in sinus rhythm.  Usually, she feels  the palpitations if she is in atrial fibrillation.  She denies any chest  pain or dyspnea.  There is no dizziness, presyncope, or syncope.   CURRENT MEDICATIONS:  1. Aspirin 81 mg once daily.  2. Lexapro 20 mg once daily.  3. Synthroid 0.15 mg once daily.  4. Omeprazole 20 mg once daily.  5. Hydrochlorothiazide 25 mg once daily.  6. Simvastatin 40 mg at bedtime.  7. Metformin 1000 mg twice daily.  8. Toprol-XL 50 mg twice daily.  9. Benazepril 10 mg once daily.  10.Pradaxa 150 mg twice daily which was started recently to replace      warfarin.   PHYSICAL EXAMINATION:  GENERAL:  The patient is in no acute distress.  VITAL SIGNS:  Weight of 242.6 pounds, blood pressure is 99/69, pulse is  68, and oxygen saturation is 96% on room air.  NECK:  No JVD or carotid bruits.  LUNGS:  Clear to auscultation.  CARDIOVASCULAR:  Regular rate and rhythm with no gallops or murmurs.  ABDOMEN:  Benign, nontender, nondistended.  EXTREMITIES:  With no clubbing, cyanosis, or edema.  SKIN:  Dry with no rash.   ASSESSMENT AND PLAN:  1. Paroxysmal atrial fibrillation/flutter:  The patient seems to be in      sinus rhythm and has not had any episodes of  palpitations since the      last visit.  We will continue with metoprolol for now.  If she      starts getting more episodes, we can consider an antiarrhythmic      medication.  I agree with long-term anticoagulation due to history      of hypertension and diabetes.  She was switched recently to Pradaxa      to substitute warfarin.  Given that she does not have any history      of coronary artery disease, I recommend that she stops aspirin      daily.  2. Hypertension.  Blood pressure is well-controlled.  3. Hyperlipidemia.  The patient does have history of elevated      triglycerides, but this might be improving now given that her      diabetes is under better control.  She is due in few months to have      her lipid profile checked.  We can consider adding a  fibrate      medication, but will only recommend that if triglyceride is      elevated to least to 200/250 given that multiple studies have shown      no great benefit adding a fibrate to a statin in diabetic patient's      and patient's with borderline elevated triglycerides.  The patient      will return in 6 months for followup or earlier if needed.     Lorine Bears, MD  Electronically Signed    MA/MedQ  DD: 09/02/2009  DT: 09/03/2009  Job #: [id number]

## 2010-06-12 NOTE — Op Note (Signed)
Carolyn [name], Carolyn [name]              ACCOUNT NO.:  [id number]   MEDICAL RECORD NO.:  [id number]          PATIENT TYPE:  OBV   LOCATION:  0443                         FACILITY:  [facility]   PHYSICIAN:  Georges Lynch. Gioffre, M.D.DATE OF BIRTH:  August 12, 1950   DATE OF PROCEDURE:  12/06/2003  DATE OF DISCHARGE:                                 OPERATIVE REPORT   SURGEON:  Dr. Darrelyn Hillock   ASSISTANT:  Terie Purser, PA-C   PREOPERATIVE DIAGNOSIS:  Chronic greater trochanteric bursitis with a  contracture of the iliotibial band, left hip.   POSTOPERATIVE DIAGNOSIS:  Chronic greater trochanteric bursitis with a  contracture of the iliotibial band, left hip.   OPERATION:  1.  Release of the iliotibial band.  2.  Excision of the greater trochanteric bursa, left hip.   DESCRIPTION OF PROCEDURE:  Under general anesthesia, the patient on her  right side up and left side up, a routine orthopedic prep and draping of the  left lower extremity was carried out.  The patient had 1 g of IV Ancef.  At  this time, an incision was made over the greater trochanteric bursa over the  lateral aspect of the left hip, bleeders identified and cauterized.  I then  inserted the self-retaining cerebellar retractors.  We went down and  identified the bleeders, cauterized the bleeders with the cautery.  I then  incised the iliotibial band longitudinally and then transversely the band  was quite contracted and exerting quite a bit of pressure over the greater  trochanteric region.  We then went down and excised the greater trochanteric  bursa.  She had what looked like a chronic inflammation.  Thoroughly  irrigated out the area and then loosely applied some thrombin-soaked  Gelfoam.  We then closed the wound in layers in usual fashion.  The skin was  closed with metal staples, and a sterile Neosporin dressing was applied.  She had 30 mg of IV Toradol prior to leaving the operating room.      RAG/MEDQ  D:  12/06/2003  T:   12/06/2003  Job:  [id number]

## 2010-07-21 ENCOUNTER — Encounter: Payer: Self-pay | Admitting: Cardiovascular Disease

## 2010-10-13 ENCOUNTER — Encounter: Payer: Self-pay | Admitting: Cardiovascular Disease

## 2010-10-15 ENCOUNTER — Encounter: Payer: Self-pay | Admitting: Cardiovascular Disease

## 2011-03-01 ENCOUNTER — Encounter: Payer: Self-pay | Admitting: Cardiovascular Disease

## 2013-09-24 HISTORY — PX: PACEMAKER INSERTION: SHX728

## 2014-02-20 ENCOUNTER — Encounter (HOSPITAL_COMMUNITY): Payer: Self-pay | Admitting: *Deleted

## 2014-02-20 ENCOUNTER — Other Ambulatory Visit: Payer: Self-pay | Admitting: Surgical

## 2014-02-20 NOTE — H&P (Signed)
Carolyn [name] is an 64 y.o. female.   Chief Complaint: left ankle pain  HPI: The patient is a 64 year old female who presented with the chief complaint of left ankle pain. She presented to the office with her daughter. She reports that on 02/18/2014 she was at home when she fell after feeling lightheaded. She reports that she thinks her sugar was low. She says that it felt as if her legs buckled underneath her. She went to the ER at [facility] where that x-ray her left ankle, noting a fracture/dislocation. They successfully reduced the ankle and placed her in a posterior splint. She has been NWB since the incident. She is on anticoagulation in the form of Pradaxa but reports that her last dose was on 02/18/2014.   Past Medical History  Diagnosis Date  . PAF (paroxysmal atrial fibrillation)   . HTN (hypertension)   . DM2 (diabetes mellitus, type 2)   . HLD (hyperlipidemia)   . Obesity   . Hypothyroidism   . Carpal tunnel syndrome     Past Surgical History  Procedure Laterality Date  . Tubal ligation    . Cholecystectomy    . Total hip arthroplasty    . Carpal tunnel release      Family History  Problem Relation Age of Onset  . Heart failure     Social History:  reports that she quit smoking about 5 years ago. She does not have any smokeless tobacco history on file. She reports that she does not drink alcohol or use illicit drugs.  Allergies:  Allergies  Allergen Reactions  . Hydrocodone-Acetaminophen     REACTION: Patient states she doesnt brake in hives or anything. Just feels bad when she takes it.     Current outpatient prescriptions:  Meloxicam (15MG  Tablet, 1 (one) Tablet Oral daily, Active.  Atorvastatin Calcium (20MG  Tablet, Oral) Active. (qd) Methocarbamol (750MG  Tablet, Oral) Active. (q 8 hr Rx'd by PCP) Diltiazem HCl ER Coated Beads (240MG  Capsule ER 24HR, Oral) Active. (qd) Pradaxa (150MG  Capsule, Oral) Active. (bid) Potassium Chloride Crys ER (20MEQ  Tablet ER, Oral) Active. Pantoprazole Sodium (40MG  Tablet DR, Oral) Active. (qd) Metoprolol Tartrate (50MG  Tablet, Oral) Active. (bid) Levothyroxine Sodium (150MCG Tablet, Oral) Active. (qd) Janumet XR (50-1000MG  Tablet ER 24HR, Oral) Active. (bid) Invokana (100MG  Tablet, Oral) Active. (qd) Gabapentin (300MG  Capsule, Oral) Active. (bid) Escitalopram Oxalate (10MG  Tablet, Oral) Active. (qd) Benazepril HCl (10MG  Tablet, Oral) Active. (qd) Furosemide (20MG  Tablet, Oral) Active. Simvastatin (40MG  Tablet, Oral) Active. (qd)  Review of Systems  Constitutional: Negative.   HENT: Negative.   Eyes: Negative.   Respiratory: Positive for shortness of breath. Negative for cough, hemoptysis, sputum production and wheezing.        SOB with exertion  Cardiovascular: Positive for palpitations. Negative for chest pain, orthopnea, claudication, leg swelling and PND.  Gastrointestinal: Negative.   Genitourinary: Negative.   Musculoskeletal: Positive for back pain, joint pain and falls. Negative for myalgias and neck pain.       Left ankle pain  Skin: Negative.   Neurological: Negative.   Endo/Heme/Allergies: Negative.   Psychiatric/Behavioral: Negative.    Vitals  Weight: 237 lb Height: 64in Body Surface Area: 2.1 m Body Mass Index: 40.68 kg/m  Pulse: 103 (Regular)  BP: 101/71 (Sitting, Left Arm, Standard)  Physical Exam  Constitutional: She is oriented to person, place, and time. She appears well-developed. No distress.  Obese  HENT:  Head: Normocephalic and atraumatic.  Right Ear: External ear normal.  Left Ear: External ear normal.  Nose: Nose normal.  Mouth/Throat: Oropharynx is clear and moist.  Eyes: Conjunctivae and EOM are normal.  Neck: Normal range of motion. Neck supple.  Cardiovascular: Intact distal pulses.  An irregularly irregular rhythm present. Tachycardia present.   Murmur heard.  Systolic murmur is present with a grade of 3/6  Respiratory: Effort  normal and breath sounds normal. No respiratory distress. She has no wheezes.  GI: Soft. Bowel sounds are normal. She exhibits no distension. There is no tenderness.  Musculoskeletal:       Right hip: Normal.       Left hip: Normal.       Right knee: Normal.       Left knee: Normal.       Right ankle: Normal.       Left ankle: She exhibits swelling and ecchymosis. Tenderness. Lateral malleolus and medial malleolus tenderness found.       Right lower leg: She exhibits no tenderness, no swelling and no edema.       Left lower leg: She exhibits swelling and edema. She exhibits no tenderness.  Neurological: She is alert and oriented to person, place, and time. She has normal strength and normal reflexes. No sensory deficit.  Skin: No rash noted. She is not diaphoretic. No erythema.  Psychiatric: She has a normal mood and affect. Her behavior is normal.     X-ray Report: Displaced lateral malleolar fracture with associated lateral displacement of the talus. Avulsion fracture off the medial malleolus and mild displaced fracture of the posterior malleolus. Post reduction report: Improved alignment but with mild residual widening of the mortisse and posterior displacement of the posterior and lateral malleolar fragments.   Assessment/Plan Left ankle, trimalleolar fracture with dislocation She needs a left ankle ORIF with fibular plate and syndesmosis screws. She will remain in the splint that she was placed in at the ER in [place] until in the OR. We discussed her case with her cardiologist and he is comfortable with her discontinuing her Pradaxa until the day after surgery. Risks and benefits of the procedure were discussed with the patient by Dr. Latanya Maudlin.    H&P performed by Dr. Latanya Maudlin Documented by Ardeen Jourdain, PA-C  [place], Atiya Yera Ander Purpura 02/20/2014, 11:05 AM

## 2014-02-20 NOTE — Progress Notes (Signed)
Please put orsers in Epic for Same day surgery tomorrow Thursday, 02-21-14 Thanks

## 2014-02-21 ENCOUNTER — Ambulatory Visit (HOSPITAL_COMMUNITY): Payer: BLUE CROSS/BLUE SHIELD | Admitting: Anesthesiology

## 2014-02-21 ENCOUNTER — Ambulatory Visit (HOSPITAL_COMMUNITY): Payer: BLUE CROSS/BLUE SHIELD

## 2014-02-21 ENCOUNTER — Inpatient Hospital Stay (HOSPITAL_COMMUNITY)
Admission: RE | Admit: 2014-02-21 | Discharge: 2014-02-23 | DRG: 493 | Disposition: A | Discharge status: Home Health | Payer: BLUE CROSS/BLUE SHIELD | Source: Ambulatory Visit | Attending: Orthopedic Surgery | Admitting: Orthopedic Surgery

## 2014-02-21 ENCOUNTER — Encounter (HOSPITAL_COMMUNITY)
Admission: RE | Disposition: A | Discharge status: Home Health | Payer: Self-pay | Source: Ambulatory Visit | Attending: Orthopedic Surgery

## 2014-02-21 ENCOUNTER — Encounter (HOSPITAL_COMMUNITY): Payer: Self-pay | Admitting: *Deleted

## 2014-02-21 DIAGNOSIS — I10 Essential (primary) hypertension: Secondary | ICD-10-CM | POA: Diagnosis present

## 2014-02-21 DIAGNOSIS — Z6841 Body Mass Index (BMI) 40.0 and over, adult: Secondary | ICD-10-CM | POA: Diagnosis not present

## 2014-02-21 DIAGNOSIS — E119 Type 2 diabetes mellitus without complications: Secondary | ICD-10-CM | POA: Diagnosis present

## 2014-02-21 DIAGNOSIS — S82843A Displaced bimalleolar fracture of unspecified lower leg, initial encounter for closed fracture: Secondary | ICD-10-CM

## 2014-02-21 DIAGNOSIS — S82842A Displaced bimalleolar fracture of left lower leg, initial encounter for closed fracture: Secondary | ICD-10-CM

## 2014-02-21 DIAGNOSIS — M25572 Pain in left ankle and joints of left foot: Secondary | ICD-10-CM | POA: Diagnosis present

## 2014-02-21 DIAGNOSIS — Z7901 Long term (current) use of anticoagulants: Secondary | ICD-10-CM | POA: Diagnosis not present

## 2014-02-21 DIAGNOSIS — S82852A Displaced trimalleolar fracture of left lower leg, initial encounter for closed fracture: Principal | ICD-10-CM | POA: Diagnosis present

## 2014-02-21 DIAGNOSIS — Y92009 Unspecified place in unspecified non-institutional (private) residence as the place of occurrence of the external cause: Secondary | ICD-10-CM | POA: Diagnosis not present

## 2014-02-21 DIAGNOSIS — Z950 Presence of cardiac pacemaker: Secondary | ICD-10-CM

## 2014-02-21 DIAGNOSIS — G4730 Sleep apnea, unspecified: Secondary | ICD-10-CM | POA: Diagnosis present

## 2014-02-21 DIAGNOSIS — M1990 Unspecified osteoarthritis, unspecified site: Secondary | ICD-10-CM | POA: Diagnosis present

## 2014-02-21 DIAGNOSIS — E039 Hypothyroidism, unspecified: Secondary | ICD-10-CM | POA: Diagnosis present

## 2014-02-21 DIAGNOSIS — Z96649 Presence of unspecified artificial hip joint: Secondary | ICD-10-CM | POA: Diagnosis present

## 2014-02-21 DIAGNOSIS — K219 Gastro-esophageal reflux disease without esophagitis: Secondary | ICD-10-CM | POA: Diagnosis present

## 2014-02-21 DIAGNOSIS — W1830XA Fall on same level, unspecified, initial encounter: Secondary | ICD-10-CM | POA: Diagnosis present

## 2014-02-21 DIAGNOSIS — S93402A Sprain of unspecified ligament of left ankle, initial encounter: Secondary | ICD-10-CM | POA: Diagnosis present

## 2014-02-21 DIAGNOSIS — Z419 Encounter for procedure for purposes other than remedying health state, unspecified: Secondary | ICD-10-CM

## 2014-02-21 DIAGNOSIS — I480 Paroxysmal atrial fibrillation: Secondary | ICD-10-CM | POA: Diagnosis present

## 2014-02-21 DIAGNOSIS — E785 Hyperlipidemia, unspecified: Secondary | ICD-10-CM | POA: Diagnosis present

## 2014-02-21 DIAGNOSIS — Z87891 Personal history of nicotine dependence: Secondary | ICD-10-CM | POA: Diagnosis not present

## 2014-02-21 DIAGNOSIS — Z79899 Other long term (current) drug therapy: Secondary | ICD-10-CM

## 2014-02-21 HISTORY — DX: Sleep apnea, unspecified: G47.30

## 2014-02-21 HISTORY — PX: ORIF ANKLE FRACTURE: SHX5408

## 2014-02-21 HISTORY — DX: Heart failure, unspecified: I50.9

## 2014-02-21 HISTORY — DX: Presence of cardiac pacemaker: Z95.0

## 2014-02-21 HISTORY — DX: Displaced bimalleolar fracture of unspecified lower leg, initial encounter for closed fracture: S82.843A

## 2014-02-21 HISTORY — DX: Gastro-esophageal reflux disease without esophagitis: K21.9

## 2014-02-21 HISTORY — DX: Unspecified osteoarthritis, unspecified site: M19.90

## 2014-02-21 LAB — COMPREHENSIVE METABOLIC PANEL
ALT: 15 U/L (ref 0–35)
AST: 17 U/L (ref 0–37)
Albumin: 3.5 g/dL (ref 3.5–5.2)
Alkaline Phosphatase: 76 U/L (ref 39–117)
Anion gap: 10 (ref 5–15)
BUN: 36 mg/dL — ABNORMAL HIGH (ref 6–23)
CO2: 24 mmol/L (ref 19–32)
Calcium: 8.4 mg/dL (ref 8.4–10.5)
Chloride: 105 mmol/L (ref 96–112)
Creatinine, Ser: 1.49 mg/dL — ABNORMAL HIGH (ref 0.50–1.10)
GFR calc Af Amer: 43 mL/min — ABNORMAL LOW (ref >90)
GFR calc non Af Amer: 37 mL/min — ABNORMAL LOW (ref >90)
Glucose, Bld: 122 mg/dL — ABNORMAL HIGH (ref 70–99)
Potassium: 4 mmol/L (ref 3.5–5.1)
Sodium: 139 mmol/L (ref 135–145)
Total Bilirubin: 2.5 mg/dL — ABNORMAL HIGH (ref 0.3–1.2)
Total Protein: 6.7 g/dL (ref 6.0–8.3)

## 2014-02-21 LAB — GLUCOSE, CAPILLARY
GLUCOSE-CAPILLARY: 99 mg/dL (ref 70–99)
GLUCOSE-CAPILLARY: 156 mg/dL — AB (ref 70–99)
Glucose-Capillary: 154 mg/dL — ABNORMAL HIGH (ref 70–99)

## 2014-02-21 LAB — URINE MICROSCOPIC-ADD ON

## 2014-02-21 LAB — CBC WITH DIFFERENTIAL/PLATELET
Basophils Absolute: 0 10*3/uL (ref 0.0–0.1)
Basophils Relative: 1 % (ref 0–1)
Eosinophils Absolute: 0.3 10*3/uL (ref 0.0–0.7)
Eosinophils Relative: 3 % (ref 0–5)
HCT: 27.6 % — ABNORMAL LOW (ref 36.0–46.0)
Hemoglobin: 9.1 g/dL — ABNORMAL LOW (ref 12.0–15.0)
Lymphocytes Relative: 27 % (ref 12–46)
Lymphs Abs: 2.2 10*3/uL (ref 0.7–4.0)
MCH: 29.4 pg (ref 26.0–34.0)
MCHC: 33 g/dL (ref 30.0–36.0)
MCV: 89 fL (ref 78.0–100.0)
Monocytes Absolute: 0.6 10*3/uL (ref 0.1–1.0)
Monocytes Relative: 8 % (ref 3–12)
Neutro Abs: 5.1 10*3/uL (ref 1.7–7.7)
Neutrophils Relative %: 61 % (ref 43–77)
Platelets: 171 10*3/uL (ref 150–400)
RBC: 3.1 MIL/uL — ABNORMAL LOW (ref 3.87–5.11)
RDW: 14 % (ref 11.5–15.5)
WBC: 8.3 10*3/uL (ref 4.0–10.5)

## 2014-02-21 LAB — URINALYSIS, ROUTINE W REFLEX MICROSCOPIC
Bilirubin Urine: NEGATIVE
Glucose, UA: NEGATIVE mg/dL
Ketones, ur: NEGATIVE mg/dL
Nitrite: NEGATIVE
Protein, ur: 30 mg/dL — AB
Specific Gravity, Urine: 1.011 (ref 1.005–1.030)
Urobilinogen, UA: 0.2 mg/dL (ref 0.0–1.0)
pH: 5 (ref 5.0–8.0)

## 2014-02-21 LAB — PROTIME-INR
INR: 1.41 (ref 0.00–1.49)
Prothrombin Time: 17.4 seconds — ABNORMAL HIGH (ref 11.6–15.2)

## 2014-02-21 LAB — APTT: aPTT: 39 seconds — ABNORMAL HIGH (ref 24–37)

## 2014-02-21 SURGERY — OPEN REDUCTION INTERNAL FIXATION (ORIF) ANKLE FRACTURE
Anesthesia: General | Site: Ankle | Laterality: Left

## 2014-02-21 MED ORDER — LIDOCAINE HCL (CARDIAC) 20 MG/ML IV SOLN
INTRAVENOUS | Status: DC | PRN
  Administered 2014-02-21: 75 mg via INTRAVENOUS

## 2014-02-21 MED ORDER — 0.9 % SODIUM CHLORIDE (POUR BTL) OPTIME
TOPICAL | Status: DC | PRN
  Administered 2014-02-21: 1000 mL

## 2014-02-21 MED ORDER — METHOCARBAMOL 1000 MG/10ML IJ SOLN
500.0000 mg | Freq: Four times a day (QID) | INTRAMUSCULAR | Status: DC | PRN
  Filled 2014-02-21: qty 5

## 2014-02-21 MED ORDER — DEXAMETHASONE SODIUM PHOSPHATE 10 MG/ML IJ SOLN
INTRAMUSCULAR | Status: AC
  Filled 2014-02-21: qty 1

## 2014-02-21 MED ORDER — BENAZEPRIL HCL 10 MG PO TABS
10.0000 mg | ORAL_TABLET | Freq: Every day | ORAL | Status: DC
  Administered 2014-02-22 – 2014-02-23 (×2): 10 mg via ORAL
  Filled 2014-02-21 (×2): qty 1

## 2014-02-21 MED ORDER — POLYETHYLENE GLYCOL 3350 17 G PO PACK: 17.0000 g | PACK | Freq: Every day | ORAL | Status: DC | PRN

## 2014-02-21 MED ORDER — SODIUM CHLORIDE 0.9 % IJ SOLN
INTRAMUSCULAR | Status: AC
  Filled 2014-02-21: qty 20

## 2014-02-21 MED ORDER — METOPROLOL TARTRATE 50 MG PO TABS
50.0000 mg | ORAL_TABLET | Freq: Once | ORAL | Status: AC
  Administered 2014-02-21: 50 mg via ORAL
  Filled 2014-02-21: qty 1

## 2014-02-21 MED ORDER — ACETAMINOPHEN 10 MG/ML IV SOLN
1000.0000 mg | Freq: Once | INTRAVENOUS | Status: DC
  Filled 2014-02-21: qty 100

## 2014-02-21 MED ORDER — FERROUS SULFATE 325 (65 FE) MG PO TABS
325.0000 mg | ORAL_TABLET | Freq: Three times a day (TID) | ORAL | Status: DC
  Administered 2014-02-22 – 2014-02-23 (×4): 325 mg via ORAL
  Filled 2014-02-21 (×7): qty 1

## 2014-02-21 MED ORDER — CEFAZOLIN SODIUM-DEXTROSE 2-3 GM-% IV SOLR
INTRAVENOUS | Status: AC
  Filled 2014-02-21: qty 50

## 2014-02-21 MED ORDER — PHENYLEPHRINE HCL 10 MG/ML IJ SOLN
INTRAMUSCULAR | Status: AC
  Filled 2014-02-21: qty 1

## 2014-02-21 MED ORDER — LIDOCAINE HCL (CARDIAC) 20 MG/ML IV SOLN
INTRAVENOUS | Status: AC
  Filled 2014-02-21: qty 5

## 2014-02-21 MED ORDER — HYDROMORPHONE HCL 1 MG/ML IJ SOLN
0.5000 mg | INTRAMUSCULAR | Status: DC | PRN
  Administered 2014-02-22: 1 mg via INTRAVENOUS
  Filled 2014-02-21: qty 1

## 2014-02-21 MED ORDER — OXYCODONE HCL 5 MG PO TABS
5.0000 mg | ORAL_TABLET | ORAL | Status: DC | PRN
Start: 2014-02-21 — End: 2014-02-22
  Administered 2014-02-22: 5 mg via ORAL
  Filled 2014-02-21: qty 1

## 2014-02-21 MED ORDER — CEFAZOLIN SODIUM-DEXTROSE 2-3 GM-% IV SOLR
2.0000 g | INTRAVENOUS | Status: AC
  Administered 2014-02-21: 2 g via INTRAVENOUS

## 2014-02-21 MED ORDER — EPHEDRINE SULFATE 50 MG/ML IJ SOLN
INTRAMUSCULAR | Status: AC
  Filled 2014-02-21: qty 1

## 2014-02-21 MED ORDER — MIDAZOLAM HCL 2 MG/2ML IJ SOLN
INTRAMUSCULAR | Status: AC
  Filled 2014-02-21: qty 2

## 2014-02-21 MED ORDER — ONDANSETRON HCL 4 MG/2ML IJ SOLN
INTRAMUSCULAR | Status: DC | PRN
  Administered 2014-02-21: 4 mg via INTRAVENOUS

## 2014-02-21 MED ORDER — FUROSEMIDE 20 MG PO TABS
20.0000 mg | ORAL_TABLET | Freq: Two times a day (BID) | ORAL | Status: DC
  Administered 2014-02-22 – 2014-02-23 (×3): 20 mg via ORAL
  Filled 2014-02-21 (×5): qty 1

## 2014-02-21 MED ORDER — LEVOTHYROXINE SODIUM 150 MCG PO TABS
150.0000 ug | ORAL_TABLET | Freq: Every day | ORAL | Status: DC
  Administered 2014-02-22 – 2014-02-23 (×2): 150 ug via ORAL
  Filled 2014-02-21 (×3): qty 1

## 2014-02-21 MED ORDER — METHOCARBAMOL 500 MG PO TABS
500.0000 mg | ORAL_TABLET | Freq: Four times a day (QID) | ORAL | Status: DC | PRN
  Administered 2014-02-22 – 2014-02-23 (×3): 500 mg via ORAL
  Filled 2014-02-21 (×3): qty 1

## 2014-02-21 MED ORDER — ACETAMINOPHEN 10 MG/ML IV SOLN
INTRAVENOUS | Status: DC | PRN
  Administered 2014-02-21: 1000 mg via INTRAVENOUS

## 2014-02-21 MED ORDER — HYDROMORPHONE HCL 1 MG/ML IJ SOLN
INTRAMUSCULAR | Status: DC | PRN
  Administered 2014-02-21 (×2): 1 mg via INTRAVENOUS

## 2014-02-21 MED ORDER — BACITRACIN-NEOMYCIN-POLYMYXIN 400-5-5000 EX OINT
TOPICAL_OINTMENT | CUTANEOUS | Status: AC
  Filled 2014-02-21: qty 1

## 2014-02-21 MED ORDER — BACITRACIN-NEOMYCIN-POLYMYXIN 400-5-5000 EX OINT
TOPICAL_OINTMENT | CUTANEOUS | Status: DC | PRN
  Administered 2014-02-21: 1 via TOPICAL

## 2014-02-21 MED ORDER — MEPERIDINE HCL 50 MG/ML IJ SOLN
6.2500 mg | INTRAMUSCULAR | Status: DC | PRN
Start: 2014-02-21 — End: 2014-02-21

## 2014-02-21 MED ORDER — ONDANSETRON HCL 4 MG/2ML IJ SOLN: 4.0000 mg | Freq: Four times a day (QID) | INTRAMUSCULAR | Status: DC | PRN

## 2014-02-21 MED ORDER — METOPROLOL TARTRATE 50 MG PO TABS
50.0000 mg | ORAL_TABLET | Freq: Two times a day (BID) | ORAL | Status: DC
  Administered 2014-02-21 – 2014-02-23 (×4): 50 mg via ORAL
  Filled 2014-02-21 (×5): qty 1

## 2014-02-21 MED ORDER — METOCLOPRAMIDE HCL 5 MG/ML IJ SOLN
5.0000 mg | Freq: Three times a day (TID) | INTRAMUSCULAR | Status: DC | PRN
Start: 2014-02-21 — End: 2014-02-23

## 2014-02-21 MED ORDER — PROMETHAZINE HCL 25 MG/ML IJ SOLN: 6.2500 mg | INTRAMUSCULAR | Status: DC | PRN

## 2014-02-21 MED ORDER — FENTANYL CITRATE 0.05 MG/ML IJ SOLN
INTRAMUSCULAR | Status: AC
  Filled 2014-02-21: qty 5

## 2014-02-21 MED ORDER — PROPOFOL 10 MG/ML IV BOLUS
INTRAVENOUS | Status: AC
  Filled 2014-02-21: qty 20

## 2014-02-21 MED ORDER — FENTANYL CITRATE 0.05 MG/ML IJ SOLN: 25.0000 ug | INTRAMUSCULAR | Status: DC | PRN

## 2014-02-21 MED ORDER — MIDAZOLAM HCL 5 MG/5ML IJ SOLN
INTRAMUSCULAR | Status: DC | PRN
  Administered 2014-02-21: 2 mg via INTRAVENOUS

## 2014-02-21 MED ORDER — ESCITALOPRAM OXALATE 20 MG PO TABS
20.0000 mg | ORAL_TABLET | Freq: Every day | ORAL | Status: DC
  Administered 2014-02-22 – 2014-02-23 (×2): 20 mg via ORAL
  Filled 2014-02-21 (×2): qty 1

## 2014-02-21 MED ORDER — BUPIVACAINE HCL (PF) 0.5 % IJ SOLN
INTRAMUSCULAR | Status: AC
  Filled 2014-02-21: qty 30

## 2014-02-21 MED ORDER — ONDANSETRON HCL 4 MG PO TABS: 4.0000 mg | ORAL_TABLET | Freq: Four times a day (QID) | ORAL | Status: DC | PRN

## 2014-02-21 MED ORDER — ATORVASTATIN CALCIUM 20 MG PO TABS
20.0000 mg | ORAL_TABLET | Freq: Every day | ORAL | Status: DC
  Administered 2014-02-22: 20 mg via ORAL
  Filled 2014-02-21 (×2): qty 1

## 2014-02-21 MED ORDER — METOCLOPRAMIDE HCL 5 MG/ML IJ SOLN
INTRAMUSCULAR | Status: DC | PRN
  Administered 2014-02-21: 10 mg via INTRAVENOUS

## 2014-02-21 MED ORDER — FLEET ENEMA 7-19 GM/118ML RE ENEM: 1.0000 | ENEMA | Freq: Once | RECTAL | Status: AC | PRN

## 2014-02-21 MED ORDER — HYDROMORPHONE HCL 2 MG/ML IJ SOLN
INTRAMUSCULAR | Status: AC
  Filled 2014-02-21: qty 1

## 2014-02-21 MED ORDER — FENTANYL CITRATE 0.05 MG/ML IJ SOLN
INTRAMUSCULAR | Status: DC | PRN
  Administered 2014-02-21 (×7): 50 ug via INTRAVENOUS

## 2014-02-21 MED ORDER — CEFAZOLIN SODIUM 1-5 GM-% IV SOLN
1.0000 g | Freq: Four times a day (QID) | INTRAVENOUS | Status: AC
  Administered 2014-02-21 – 2014-02-22 (×3): 1 g via INTRAVENOUS
  Filled 2014-02-21 (×3): qty 50

## 2014-02-21 MED ORDER — PHENYLEPHRINE HCL 10 MG/ML IJ SOLN
10.0000 mg | INTRAVENOUS | Status: DC | PRN
  Administered 2014-02-21: 5 ug/min via INTRAVENOUS

## 2014-02-21 MED ORDER — BISACODYL 5 MG PO TBEC
5.0000 mg | DELAYED_RELEASE_TABLET | Freq: Every day | ORAL | Status: DC | PRN
Start: 2014-02-21 — End: 2014-02-23
  Administered 2014-02-22: 5 mg via ORAL
  Filled 2014-02-21: qty 1

## 2014-02-21 MED ORDER — LACTATED RINGERS IV SOLN
INTRAVENOUS | Status: DC
  Administered 2014-02-21: 1000 mL via INTRAVENOUS
  Administered 2014-02-21: 17:00:00 via INTRAVENOUS

## 2014-02-21 MED ORDER — PANTOPRAZOLE SODIUM 40 MG PO TBEC
40.0000 mg | DELAYED_RELEASE_TABLET | Freq: Every day | ORAL | Status: DC
  Administered 2014-02-22 – 2014-02-23 (×2): 40 mg via ORAL
  Filled 2014-02-21 (×2): qty 1

## 2014-02-21 MED ORDER — SUFENTANIL CITRATE 50 MCG/ML IV SOLN
INTRAVENOUS | Status: AC
  Filled 2014-02-21: qty 1

## 2014-02-21 MED ORDER — INSULIN ASPART 100 UNIT/ML ~~LOC~~ SOLN
0.0000 [IU] | Freq: Three times a day (TID) | SUBCUTANEOUS | Status: DC
  Administered 2014-02-22: 3 [IU] via SUBCUTANEOUS
  Administered 2014-02-22: 2 [IU] via SUBCUTANEOUS
  Administered 2014-02-22: 5 [IU] via SUBCUTANEOUS

## 2014-02-21 MED ORDER — ONDANSETRON HCL 4 MG/2ML IJ SOLN
INTRAMUSCULAR | Status: AC
  Filled 2014-02-21: qty 2

## 2014-02-21 MED ORDER — PROPOFOL 10 MG/ML IV BOLUS
INTRAVENOUS | Status: DC | PRN
  Administered 2014-02-21: 170 mg via INTRAVENOUS

## 2014-02-21 MED ORDER — METOCLOPRAMIDE HCL 10 MG PO TABS: 5.0000 mg | ORAL_TABLET | Freq: Three times a day (TID) | ORAL | Status: DC | PRN

## 2014-02-21 MED ORDER — LACTATED RINGERS IV SOLN: INTRAVENOUS | Status: DC

## 2014-02-21 SURGICAL SUPPLY — 56 items
BAG SPEC THK2 15X12 ZIP CLS (MISCELLANEOUS)
BAG ZIPLOCK 12X15 (MISCELLANEOUS) IMPLANT
BANDAGE ELASTIC 4 VELCRO ST LF (GAUZE/BANDAGES/DRESSINGS) ×6 IMPLANT
BIT DRILL 2.5X2.75 QC CALB (BIT) ×2 IMPLANT
BIT DRILL CALIBRATED 2.7 (BIT) ×1 IMPLANT
BIT DRILL CALIBRATED 2.7MM (BIT) ×1
BNDG GAUZE ELAST 4 BULKY (GAUZE/BANDAGES/DRESSINGS) ×3 IMPLANT
CUFF TOURN SGL QUICK 34 (TOURNIQUET CUFF) ×3
CUFF TRNQT CYL 34X4X40X1 (TOURNIQUET CUFF) ×1 IMPLANT
DRAPE C-ARM 42X120 X-RAY (DRAPES) ×3 IMPLANT
DRAPE C-ARMOR (DRAPES) ×3 IMPLANT
DRAPE U-SHAPE 47X51 STRL (DRAPES) ×3 IMPLANT
DRSG ADAPTIC 3X8 NADH LF (GAUZE/BANDAGES/DRESSINGS) ×3 IMPLANT
DRSG PAD ABDOMINAL 8X10 ST (GAUZE/BANDAGES/DRESSINGS) ×6 IMPLANT
DURAPREP 26ML APPLICATOR (WOUND CARE) ×3 IMPLANT
ELECT REM PT RETURN 9FT ADLT (ELECTROSURGICAL) ×3
ELECTRODE REM PT RTRN 9FT ADLT (ELECTROSURGICAL) ×1 IMPLANT
GAUZE SPONGE 4X4 12PLY STRL (GAUZE/BANDAGES/DRESSINGS) ×3 IMPLANT
GLOVE BIOGEL PI IND STRL 6.5 (GLOVE) ×1 IMPLANT
GLOVE BIOGEL PI IND STRL 8 (GLOVE) ×1 IMPLANT
GLOVE BIOGEL PI INDICATOR 6.5 (GLOVE) ×2
GLOVE BIOGEL PI INDICATOR 8 (GLOVE) ×2
GLOVE ECLIPSE 8.0 STRL XLNG CF (GLOVE) ×3 IMPLANT
GLOVE SURG SS PI 6.5 STRL IVOR (GLOVE) ×3 IMPLANT
GOWN STRL REUS W/TWL LRG LVL3 (GOWN DISPOSABLE) ×3 IMPLANT
GOWN STRL REUS W/TWL XL LVL3 (GOWN DISPOSABLE) ×3 IMPLANT
K-WIRE ACE 1.6X6 (WIRE) ×3
KWIRE ACE 1.6X6 (WIRE) IMPLANT
MANIFOLD NEPTUNE II (INSTRUMENTS) ×3 IMPLANT
NEEDLE HYPO 22GX1.5 SAFETY (NEEDLE) ×3 IMPLANT
PACK ORTHO EXTREMITY (CUSTOM PROCEDURE TRAY) ×3 IMPLANT
PAD CAST 4YDX4 CTTN HI CHSV (CAST SUPPLIES) ×2 IMPLANT
PADDING CAST COTTON 4X4 STRL (CAST SUPPLIES) ×9
PLATE LOCK 3H 95 LT DIST FIB (Plate) ×2 IMPLANT
POSITIONER SURGICAL ARM (MISCELLANEOUS) ×3 IMPLANT
SCREW LOCK 3.5X50 DIST TIB (Screw) ×2 IMPLANT
SCREW LOCK 3.5X52 DIST TIB (Screw) ×2 IMPLANT
SCREW LOCK CORT STAR 3.5X14 (Screw) ×2 IMPLANT
SCREW LOCK CORT STAR 3.5X16 (Screw) ×6 IMPLANT
SCREW LOCK CORT STAR 3.5X18 (Screw) ×2 IMPLANT
SCREW LOW PROFILE 12MMX3.5MM (Screw) ×4 IMPLANT
SCREW LOW PROFILE 3.5X48MM (Screw) ×2 IMPLANT
SCREW LP 3.5 (Screw) ×2 IMPLANT
SCREW NON LOCKING LP 3.5 14MM (Screw) ×2 IMPLANT
SPLINT FIBERGLASS 3X35 (CAST SUPPLIES) ×4 IMPLANT
SPLINT FIBERGLASS 4X15 (CAST SUPPLIES) ×2 IMPLANT
SPLINT FIBERGLASS 4X30 (CAST SUPPLIES) ×3 IMPLANT
SPONGE LAP 4X18 X RAY DECT (DISPOSABLE) IMPLANT
STAPLER VISISTAT 35W (STAPLE) IMPLANT
SUT ETHILON 3 0 PS 1 (SUTURE) IMPLANT
SUT VIC AB 1 CT1 27 (SUTURE) ×6
SUT VIC AB 1 CT1 27XBRD ANTBC (SUTURE) ×2 IMPLANT
SUT VIC AB 2-0 CT1 27 (SUTURE) ×3
SUT VIC AB 2-0 CT1 TAPERPNT 27 (SUTURE) ×1 IMPLANT
SYR 20CC LL (SYRINGE) ×3 IMPLANT
TOWEL OR 17X26 10 PK STRL BLUE (TOWEL DISPOSABLE) ×6 IMPLANT

## 2014-02-21 NOTE — Anesthesia Preprocedure Evaluation (Signed)
Anesthesia Evaluation  Patient identified by MRN, date of birth, ID band Patient awake    Reviewed: Allergy & Precautions, NPO status , Patient's Chart, lab work & pertinent test results  Airway Mallampati: II  TM Distance: >3 FB Neck ROM: Full    Dental no notable dental hx.    Pulmonary sleep apnea , former smoker,  breath sounds clear to auscultation  Pulmonary exam normal       Cardiovascular hypertension, Pt. on medications +CHF + dysrhythmias Atrial Fibrillation + pacemaker Rhythm:Regular Rate:Normal     Neuro/Psych negative neurological ROS  negative psych ROS   GI/Hepatic negative GI ROS, Neg liver ROS,   Endo/Other  diabetes, Type 2, Oral Hypoglycemic AgentsMorbid obesity  Renal/GU negative Renal ROS  negative genitourinary   Musculoskeletal negative musculoskeletal ROS (+)   Abdominal   Peds negative pediatric ROS (+)  Hematology negative hematology ROS (+)   Anesthesia Other Findings   Reproductive/Obstetrics negative OB ROS                             Anesthesia Physical Anesthesia Plan  ASA: III  Anesthesia Plan: General   Post-op Pain Management:    Induction: Intravenous  Airway Management Planned: Oral ETT  Additional Equipment:   Intra-op Plan:   Post-operative Plan: Extubation in OR  Informed Consent: I have reviewed the patients History and Physical, chart, labs and discussed the procedure including the risks, benefits and alternatives for the proposed anesthesia with the patient or authorized representative who has indicated his/her understanding and acceptance.   Dental advisory given  Plan Discussed with: CRNA  Anesthesia Plan Comments:         Anesthesia Quick Evaluation

## 2014-02-21 NOTE — Anesthesia Postprocedure Evaluation (Signed)
  Anesthesia Post-op Note  Patient: Carolyn [name]  Procedure(s) Performed: Procedure(s): OPEN REDUCTION INTERNAL FIXATION (ORIF) LEFT  ANKLE FRACTURE (Left)  Patient Location: PACU  Anesthesia Type:General  Level of Consciousness: awake and alert   Airway and Oxygen Therapy: Patient Spontanous Breathing  Post-op Pain: none  Post-op Assessment: Post-op Vital signs reviewed  Post-op Vital Signs: Reviewed  Last Vitals:  Filed Vitals:   02/21/14 2048  BP: 130/69  Pulse: 78  Temp:   Resp: 16    Complications: No apparent anesthesia complications

## 2014-02-21 NOTE — Brief Op Note (Signed)
02/21/2014  6:34 PM  PATIENT:  Carolyn [name]  64 y.o. female  PRE-OPERATIVE DIAGNOSIS:  LEFT ANKLE FRACTURE ,Bimalleolar ,Closed Fracture-Dislocation of the Left Ankle  POST-OPERATIVE DIAGNOSIS:  Same as Pre-Op  PROCEDURE:  Open Reduction and Internal Fixation of a Closed BiMalleolar Fracture Dislocation Left Ankle.Application of a Well-Padded Short Leg Cast SURGEON:  Surgeon(s) and Role:    * Tobi Bastos, MD - Primary  PHYSICIAN ASSISTANT: Ardeen Jourdain PA  ASSISTANTS: Ardeen Jourdain PA  ANESTHESIA:   general  EBL:     BLOOD ADMINISTERED:none  DRAINS: none   LOCAL MEDICATIONS USED:  NONE  SPECIMEN:  No Specimen  DISPOSITION OF SPECIMEN:  N/A  COUNTS:  YES  TOURNIQUET:   Total Tourniquet Time Documented: Thigh (Left) - 100 minutes Total: Thigh (Left) - 100 minutes   DICTATION: .Other Dictation: Dictation Number [id number]  PLAN OF CARE: Admit to inpatient   PATIENT DISPOSITION:  PACU - hemodynamically stable.   Delay start of Pharmacological VTE agent (>24hrs) due to surgical blood loss or risk of bleeding: yes

## 2014-02-21 NOTE — Interval H&P Note (Signed)
History and Physical Interval Note:  02/21/2014 3:00 PM  Carolyn [name]  has presented today for surgery, with the diagnosis of LEFT ANKLE FRACTURE   The various methods of treatment have been discussed with the patient and family. After consideration of risks, benefits and other options for treatment, the patient has consented to  Procedure(s): OPEN REDUCTION INTERNAL FIXATION (ORIF) LEFT  ANKLE FRACTURE (Left) as a surgical intervention .  The patient's history has been reviewed, patient examined, no change in status, stable for surgery.  I have reviewed the patient's chart and labs.  Questions were answered to the patient's satisfaction.     [name]

## 2014-02-21 NOTE — Transfer of Care (Signed)
Immediate Anesthesia Transfer of Care Note  Patient: Carolyn [name]  Procedure(s) Performed: Procedure(s): OPEN REDUCTION INTERNAL FIXATION (ORIF) LEFT  ANKLE FRACTURE (Left)  Patient Location: PACU  Anesthesia Type:General  Level of Consciousness: awake, alert , oriented and patient cooperative  Airway & Oxygen Therapy: Patient Spontanous Breathing and Patient connected to face mask oxygen  Post-op Assessment: Report given to RN, Post -op Vital signs reviewed and stable and Patient moving all extremities X 4  Post vital signs: stable  Last Vitals:  Filed Vitals:   02/21/14 1400  Pulse: 94  Temp: 36.8 C  Resp: 18    Complications: No apparent anesthesia complications

## 2014-02-22 LAB — CBC WITH DIFFERENTIAL/PLATELET
Basophils Absolute: 0 10*3/uL (ref 0.0–0.1)
Basophils Relative: 0 % (ref 0–1)
EOS ABS: 0.1 10*3/uL (ref 0.0–0.7)
Eosinophils Relative: 1 % (ref 0–5)
HEMATOCRIT: 29.3 % — AB (ref 36.0–46.0)
Hemoglobin: 9.3 g/dL — ABNORMAL LOW (ref 12.0–15.0)
LYMPHS ABS: 2.2 10*3/uL (ref 0.7–4.0)
Lymphocytes Relative: 22 % (ref 12–46)
MCH: 29.2 pg (ref 26.0–34.0)
MCHC: 31.7 g/dL (ref 30.0–36.0)
MCV: 92.1 fL (ref 78.0–100.0)
Monocytes Absolute: 0.9 10*3/uL (ref 0.1–1.0)
Monocytes Relative: 9 % (ref 3–12)
NEUTROS ABS: 6.5 10*3/uL (ref 1.7–7.7)
Neutrophils Relative %: 67 % (ref 43–77)
Platelets: 250 10*3/uL (ref 150–400)
RBC: 3.18 MIL/uL — ABNORMAL LOW (ref 3.87–5.11)
RDW: 14 % (ref 11.5–15.5)
WBC: 9.8 10*3/uL (ref 4.0–10.5)

## 2014-02-22 LAB — BASIC METABOLIC PANEL WITH GFR
Anion gap: 8 (ref 5–15)
BUN: 32 mg/dL — ABNORMAL HIGH (ref 6–23)
CO2: 25 mmol/L (ref 19–32)
Calcium: 8 mg/dL — ABNORMAL LOW (ref 8.4–10.5)
Chloride: 104 mmol/L (ref 96–112)
Creatinine, Ser: 1.39 mg/dL — ABNORMAL HIGH (ref 0.50–1.10)
GFR calc Af Amer: 46 mL/min — ABNORMAL LOW (ref >90)
GFR calc non Af Amer: 40 mL/min — ABNORMAL LOW (ref >90)
Glucose, Bld: 212 mg/dL — ABNORMAL HIGH (ref 70–99)
Potassium: 5 mmol/L (ref 3.5–5.1)
Sodium: 137 mmol/L (ref 135–145)

## 2014-02-22 LAB — GLUCOSE, CAPILLARY
Glucose-Capillary: 164 mg/dL — ABNORMAL HIGH (ref 70–99)
Glucose-Capillary: 137 mg/dL — ABNORMAL HIGH (ref 70–99)
Glucose-Capillary: 230 mg/dL — ABNORMAL HIGH (ref 70–99)
Glucose-Capillary: 170 mg/dL — ABNORMAL HIGH (ref 70–99)

## 2014-02-22 MED ORDER — OXYCODONE HCL 5 MG PO TABS
5.0000 mg | ORAL_TABLET | ORAL | Status: DC | PRN
Start: 2014-02-22 — End: 2016-11-22

## 2014-02-22 MED ORDER — ZOLPIDEM TARTRATE 10 MG PO TABS
5.0000 mg | ORAL_TABLET | Freq: Every evening | ORAL | Status: DC | PRN
  Administered 2014-02-22: 5 mg via ORAL
  Filled 2014-02-22: qty 1

## 2014-02-22 MED ORDER — METHOCARBAMOL 500 MG PO TABS: 500.0000 mg | ORAL_TABLET | Freq: Four times a day (QID) | ORAL | Status: DC | PRN

## 2014-02-22 MED ORDER — CETYLPYRIDINIUM CHLORIDE 0.05 % MT LIQD
7.0000 mL | Freq: Two times a day (BID) | OROMUCOSAL | Status: DC
  Administered 2014-02-22 – 2014-02-23 (×2): 7 mL via OROMUCOSAL

## 2014-02-22 MED ORDER — DABIGATRAN ETEXILATE MESYLATE 75 MG PO CAPS
75.0000 mg | ORAL_CAPSULE | Freq: Two times a day (BID) | ORAL | Status: DC
Start: 2014-02-22 — End: 2014-02-23
  Administered 2014-02-22 – 2014-02-23 (×3): 75 mg via ORAL
  Filled 2014-02-22 (×4): qty 1

## 2014-02-22 MED ORDER — OXYCODONE HCL 5 MG PO TABS
5.0000 mg | ORAL_TABLET | ORAL | Status: DC | PRN
  Administered 2014-02-22 – 2014-02-23 (×5): 10 mg via ORAL
  Filled 2014-02-22 (×5): qty 2

## 2014-02-22 NOTE — Discharge Instructions (Addendum)
Nonweightbearing on left leg/foot Call if any temperatures greater than 101 or any wound complications: [phone] during the day and ask for Dr. Charlestine Night nurse, Brunilda Payor. Make appointment with PCP regarding anemia

## 2014-02-22 NOTE — Plan of Care (Signed)
Problem: Phase I Progression Outcomes Goal: Sutures/staples intact Outcome: Not Applicable Date Met:  29/24/46 Unable to assess

## 2014-02-22 NOTE — Op Note (Signed)
Carolyn [name], Carolyn [name]              ACCOUNT NO.:  [id number]  MEDICAL RECORD NO.:  [id number]  LOCATION:  35                         FACILITY:  [facility]  PHYSICIAN:  Kipp Brood. Diella Gillingham, M.D.DATE OF BIRTH:  09-17-1950  DATE OF PROCEDURE:  02/21/2014 DATE OF DISCHARGE:                              OPERATIVE REPORT   SURGEON:  Kipp Brood. Gladstone Lighter, M.D.  ASSISTANT:  Ardeen Jourdain, P.A.  PREOPERATIVE DIAGNOSIS:  Closed bimalleolar severe ankle fracture dislocation with a complete disruption of the ankle mortise.  POSTOPERATIVE DIAGNOSIS:  Closed bimalleolar severe ankle fracture dislocation with a complete disruption of the ankle mortise.  OPERATIONS: 1. Open reduction and internal fixation of a closed, severely     dislocated ankle fracture. 2. Application of a well-padded short leg cast with the ankle in     inversion.  DESCRIPTION OF PROCEDURE:  Under general anesthesia, routine orthopedic prep of the right lower extremity was carried out.  Then, I did a sterile prep, to follow that are other 2 preps.  Prior to doing any surgery, appropriate time-out was carried out.  I also marked the appropriate left leg in the holding area.  The procedure under general anesthesia, we first Esmarched the leg and elevated the tourniquet at 325 mmHg.  An incision then was made over the lateral aspect of the left ankle.  Bleeders identified and cauterized.  Self-retaining retractors were inserted.  I identified the fibular fracture, reduced that, and then the C-arm was brought in.  Note, the ankle mortise was quite wide to start out.  Following that, I then applied an anatomic plate to the fibula.  After I reduced the fracture, I utilized 3 locking screws distally.  Once we had the plate locked distally and 1 screw proximally which was nonlocking on the plate, we then directed our attention to the ankle mortise.  I had to use a great deal of time to reduce the mortise. There obviously was no  posterior tibial tendon trapped in the mortise because I was able to reduce it on several occasions, the matter was holding it reduced.  I then inserted several transverse screws which was across the syndesmosis for fixation purposes.  Once we utilized and compressed the mortise, the screws, we had a good reduction of the ankle mortise.  Also, on the lateral view, we observed the posterior malleolus was reduced.  We were required to hold the ankle in inversion and dorsiflexion in order to keep that the ankle mortise closed.  I was going to explore the posterior tibial tendon, but I saw no need to do that, no need to repair the deltoid ligament.  After we had the x-rays, C-arm verification of the reduction, we then irrigated the wound and closed the wound in layers in usual fashion.  I then utilized metal staples to close the remaining part of the wound.  We then utilized a well-padded dressing with Neosporin over the wound site.  We had a well- padded short leg cast to keep the foot over in inversion, to keep the ankle mortise closed medially.  The patient had 2 g of IV Ancef preop.  She was on Pradaxa preop, so we have permission  from Dr. Bettina Gavia, her cardiologist, to stop that for 2 days preop; we can keep her off it, he said, for 2 days postop.  We noted that her hemoglobin was low at 9.3, there was a drop over the past several months.  Also, her BUN was elevated and creatinine was elevated.  We will direct attention those issues with Dr. Bettina Gavia.          ______________________________ Kipp Brood Gladstone Lighter, M.D.     RAG/MEDQ  D:  02/21/2014  T:  02/22/2014  Job:  [id number]

## 2014-02-22 NOTE — Evaluation (Signed)
Physical Therapy Evaluation Patient Details Name: Carolyn [name] MRN: [id number] DOB: 29-Aug-1950 Today's Date: 02/22/2014   History of Present Illness  64 yo female s/p L ankle ORIF 02/21/14. Hx of PAF, DM, HTN, obesity  Clinical Impression  On eval, pt required Min assist to stand and pivot with RW to and from [facility]. Pain rated as 10/10 during session-limited mobility for this reason. Pt could potentially benefit from HHPT follow up to maximize independence and safety with mobility in home environment. Pt has 1 step to enter home but she says family members are building ramp for her. Plan is for son +daughter to provide supervision/care as needed.     Follow Up Recommendations Supervision/Assistance - 24 hour;Home health PT    Equipment Recommendations  None recommended by PT (pt states she has access to all DME needed)    Recommendations for Other Services       Precautions / Restrictions Precautions Precautions: Fall Restrictions Weight Bearing Restrictions: Yes LLE Weight Bearing: Non weight bearing      Mobility  Bed Mobility Overal bed mobility: Needs Assistance Bed Mobility: Supine to Sit;Sit to Supine     Supine to sit: Min guard;HOB elevated Sit to supine: Min guard;HOB elevated   General bed mobility comments: close guard for safety. Increased time.   Transfers Overall transfer level: Needs assistance Equipment used: Rolling walker (2 wheeled) Transfers: Sit to/from [facility] Sit to Stand: Min assist Stand pivot transfers: Min assist       General transfer comment: Assist to rise, stabilize, control descent. Stand pivot with RW, bed<>bsc.   Ambulation/Gait             General Gait Details: NT-too much pain  Stairs            Wheelchair Mobility    Modified Rankin (Stroke Patients Only)       Balance                                             Pertinent Vitals/Pain Pain Assessment: 0-10 Pain  Score: 10-Worst pain ever Pain Location: L ankle  Pain Descriptors / Indicators: Throbbing Pain Intervention(s): Limited activity within patient's tolerance;Repositioned    Home Living Family/patient expects to be discharged to:: Private residence Living Arrangements: Alone Available Help at Discharge: Family Type of Home: House Home Access: Stairs to enter (family is building ramp)   Technical brewer of Steps: 1 Home Layout: One level Home Equipment: Environmental consultant - 2 wheels;Bedside commode;Crutches;Wheelchair - manual      Prior Function Level of Independence: Independent               Hand Dominance        Extremity/Trunk Assessment   Upper Extremity Assessment: Overall WFL for tasks assessed           Lower Extremity Assessment: LLE deficits/detail   LLE Deficits / Details: hip flex 3/5. Noted swelling in toes.  Cervical / Trunk Assessment: Normal  Communication   Communication: No difficulties  Cognition Arousal/Alertness: Awake/alert Behavior During Therapy: WFL for tasks assessed/performed Overall Cognitive Status: Within Functional Limits for tasks assessed                      General Comments      Exercises        Assessment/Plan    PT Assessment Patient  needs continued PT services  PT Diagnosis Difficulty walking;Abnormality of gait;Acute pain   PT Problem List Decreased range of motion;Decreased activity tolerance;Decreased balance;Decreased mobility;Pain;Decreased knowledge of use of DME;Decreased knowledge of precautions  PT Treatment Interventions DME instruction;Gait training;Functional mobility training;Therapeutic activities;Therapeutic exercise;Patient/family education;Balance training   PT Goals (Current goals can be found in the Care Plan section) Acute Rehab PT Goals Patient Stated Goal: less pain. home tomorrow. PT Goal Formulation: With patient Time For Goal Achievement: 03/01/14 Potential to Achieve Goals: Good     Frequency Min 5X/week   Barriers to discharge        Co-evaluation               End of Session   Activity Tolerance: Patient limited by pain Patient left: in bed;with call bell/phone within reach           Time:  -      Charges:         PT G Codes:        Weston Anna, MPT Pager: [phone]

## 2014-02-22 NOTE — Progress Notes (Signed)
Pt alert and oriented. Pt in a lot of pain today. Pt is POD #1 so this pain is to be expected. Pt receiving oral pain medication as prescribed. Pt understanding of plan today as she will not be discharged. Pt has been seen by PT this morning and is now relaxing in bed. Pt has no further complaints at this time and will notify this nurse if she has any needs.

## 2014-02-22 NOTE — Care Management Note (Signed)
    Page 1 of 2   02/23/2014     11:53:47 AM CARE MANAGEMENT NOTE 02/23/2014  Patient:  Carolyn [name], Carolyn [name]   Account Number:  [id number]  Date Initiated:  02/22/2014  Documentation initiated by:  [name]  Subjective/Objective Assessment:   64 yo female admitted s/p ORIF of ankle fx. PTA lived at home alone.     Action/Plan:   Home when stable   Anticipated DC Date:  02/23/2014   Anticipated DC Plan:  [place]  CM consult      [facility] Choice  HOME HEALTH   Choice offered to / List presented to:  [name]        [place] arranged  HH-2 PT      [place]   Status of service:  Completed, signed off Medicare Important Message given?   (If response is "NO", the following Medicare IM given date fields will be blank) Date Medicare IM given:   Medicare IM given by:   Date Additional Medicare IM given:   Additional Medicare IM given by:    Discharge Disposition:  [place]  Per UR Regulation:  Reviewed for med. necessity/level of care/duration of stay  If discussed at [place] of Stay Meetings, dates discussed:    Comments:  02/23/2014 [name] RN CM [phone]) Choice list offered again. Patient chose [facility], no DME needed has walker, 3 in 1, and crutches in room. TC to [name] for HHPT start of care Monday 02/25/2014. Family will be with patient at home after discharge. No further discharge needs.  02-22-14 [name] RN CM 1200 Spoke with patient at bedside. Patient somewhat drowsy but able to respond appropriately. Discussed recommendations for [facility] therapy. Patient is agreeable, states her son/dtr will be coming to stay with her at d/c. Provided patient with list for choice, she will review and I will f/u later. Discussed with RN to remind patient of list since she was so drowsy.

## 2014-02-22 NOTE — Progress Notes (Signed)
Subjective: 1 Day Post-Op Procedure(s) (LRB): OPEN REDUCTION INTERNAL FIXATION (ORIF) LEFT  ANKLE FRACTURE (Left) Patient reports pain as 5 on 0-10 scale.Circulation intact.Will ambulate Non-Weight bearing.    Objective: Vital signs in last 24 hours: Temp:  [97.9 F (36.6 C)-99.3 F (37.4 C)] 99.3 F (37.4 C) (01/29 0615) Pulse Rate:  [66-94] 93 (01/29 0615) Resp:  [9-20] 16 (01/29 0615) BP: (115-154)/(55-82) 118/55 mmHg (01/29 0615) SpO2:  [90 %-100 %] 90 % (01/29 0615) Weight:  [107.502 kg (237 lb)] 107.502 kg (237 lb) (01/28 1336)  Intake/Output from previous day: 01/28 0701 - 01/29 0700 In: 1650 [I.V.:1550; IV Piggyback:100] Out: 125 [Urine:125] Intake/Output this shift:     Recent Labs  02/21/14 1400  HGB 9.1*    Recent Labs  02/21/14 1400  WBC 8.3  RBC 3.10*  HCT 27.6*  PLT 171    Recent Labs  02/21/14 1400 02/22/14 0538  NA 139 137  K 4.0 5.0  CL 105 104  CO2 24 25  BUN 36* 32*  CREATININE 1.49* 1.39*  GLUCOSE 122* 212*  CALCIUM 8.4 8.0*    Recent Labs  02/21/14 1400  INR 1.41    Neurovascular intact  Assessment/Plan: 1 Day Post-Op Procedure(s) (LRB): OPEN REDUCTION INTERNAL FIXATION (ORIF) LEFT  ANKLE FRACTURE (Left) Up with therapy No Follow-up on file. HBg in A.M.DC Saturday.  Carolyn [name] A 02/22/2014, 7:23 AM

## 2014-02-23 LAB — CBC WITH DIFFERENTIAL/PLATELET
BASOS PCT: 0 % (ref 0–1)
Basophils Absolute: 0 10*3/uL (ref 0.0–0.1)
Eosinophils Absolute: 0.1 10*3/uL (ref 0.0–0.7)
Eosinophils Relative: 1 % (ref 0–5)
HEMATOCRIT: 27.5 % — AB (ref 36.0–46.0)
HEMOGLOBIN: 8.8 g/dL — AB (ref 12.0–15.0)
LYMPHS PCT: 27 % (ref 12–46)
Lymphs Abs: 2.1 10*3/uL (ref 0.7–4.0)
MCH: 29.2 pg (ref 26.0–34.0)
MCHC: 32 g/dL (ref 30.0–36.0)
MCV: 91.4 fL (ref 78.0–100.0)
MONO ABS: 0.9 10*3/uL (ref 0.1–1.0)
Monocytes Relative: 11 % (ref 3–12)
NEUTROS ABS: 4.7 10*3/uL (ref 1.7–7.7)
NEUTROS PCT: 61 % (ref 43–77)
Platelets: 236 10*3/uL (ref 150–400)
RBC: 3.01 MIL/uL — ABNORMAL LOW (ref 3.87–5.11)
RDW: 13.8 % (ref 11.5–15.5)
WBC: 7.9 10*3/uL (ref 4.0–10.5)

## 2014-02-23 LAB — BASIC METABOLIC PANEL
ANION GAP: 8 (ref 5–15)
BUN: 23 mg/dL (ref 6–23)
CALCIUM: 8.3 mg/dL — AB (ref 8.4–10.5)
CO2: 27 mmol/L (ref 19–32)
CREATININE: 1.11 mg/dL — AB (ref 0.50–1.10)
Chloride: 107 mmol/L (ref 96–112)
GFR calc Af Amer: 61 mL/min — ABNORMAL LOW (ref >90)
GFR calc non Af Amer: 52 mL/min — ABNORMAL LOW (ref >90)
GLUCOSE: 151 mg/dL — AB (ref 70–99)
POTASSIUM: 4.8 mmol/L (ref 3.5–5.1)
SODIUM: 142 mmol/L (ref 135–145)

## 2014-02-23 LAB — GLUCOSE, CAPILLARY
GLUCOSE-CAPILLARY: 114 mg/dL — AB (ref 70–99)
GLUCOSE-CAPILLARY: 197 mg/dL — AB (ref 70–99)

## 2014-02-23 NOTE — Progress Notes (Signed)
   Subjective: 2 Days Post-Op Procedure(s) (LRB): OPEN REDUCTION INTERNAL FIXATION (ORIF) LEFT  ANKLE FRACTURE (Left) Patient reports pain as mild.   Patient is well, and has had no acute complaints or problems other than discomfort in the left ankle. No issues overnight.  Plan is to go Home after hospital stay.  Objective: Vital signs in last 24 hours: Temp:  [97.8 F (36.6 C)-98.6 F (37 C)] 98.6 F (37 C) (01/30 0606) Pulse Rate:  [84-145] 84 (01/30 0606) Resp:  [16] 16 (01/30 0606) BP: (118-130)/(54-69) 130/63 mmHg (01/30 0606) SpO2:  [92 %-95 %] 94 % (01/30 0606)  Intake/Output from previous day:  Intake/Output Summary (Last 24 hours) at 02/23/14 0949 Last data filed at 02/22/14 2200  Gross per 24 hour  Intake    720 ml  Output      0 ml  Net    720 ml     Labs:  Recent Labs  02/21/14 1400 02/22/14 1720 02/23/14 0500  HGB 9.1* 9.3* 8.8*    Recent Labs  02/22/14 1720 02/23/14 0500  WBC 9.8 7.9  RBC 3.18* 3.01*  HCT 29.3* 27.5*  PLT 250 236    Recent Labs  02/22/14 0538 02/23/14 0500  NA 137 142  K 5.0 4.8  CL 104 107  CO2 25 27  BUN 32* 23  CREATININE 1.39* 1.11*  GLUCOSE 212* 151*  CALCIUM 8.0* 8.3*    Recent Labs  02/21/14 1400  INR 1.41    EXAM General - Patient is Alert and Oriented Extremity - Neurologically intact Neurovascular intact Cast in good position and condition   Past Medical History  Diagnosis Date  . PAF (paroxysmal atrial fibrillation)   . HTN (hypertension)   . DM2 (diabetes mellitus, type 2)   . HLD (hyperlipidemia)   . Obesity   . Hypothyroidism   . Carpal tunnel syndrome   . Heart failure   . GERD (gastroesophageal reflux disease)   . Arthritis     Osteoarthritis  . Sleep apnea     No cpap used  . CHF (congestive heart failure)     08-2013  . Presence of permanent cardiac pacemaker     09-24-13 01-21-14 Last interrogation in Epic at [place]    Assessment/Plan: 2 Days Post-Op  Procedure(s) (LRB): OPEN REDUCTION INTERNAL FIXATION (ORIF) LEFT  ANKLE FRACTURE (Left) Active Problems:   Bimalleolar ankle fracture  Estimated body mass index is 40.66 kg/(m^2) as calculated from the following:   Height as of this encounter: 5\' 4"  (1.626 m).   Weight as of this encounter: 107.502 kg (237 lb). Advance diet Up with therapy D/C IV fluids  DVT Prophylaxis - resume Pradaxa NWB left LE  She is doing well. Therapy today. DC home this afternoon. Will set up [place], PA-C Orthopaedic Surgery 02/23/2014, 9:49 AM

## 2014-02-23 NOTE — Progress Notes (Signed)
Physical Therapy Treatment Patient Details Name: Carolyn [name] MRN: [id number] DOB: 06-Feb-1950 Today's Date: 02/23/2014    History of Present Illness 64 yo female s/p L ankle ORIF 02/21/14. Hx of PAF, DM, HTN, obesity    PT Comments    *Pt progressing with mobility, she walked 10' with RW, NWB LLE. Pt feels she can manage at home with WC and RW. Encouraged pt to walk with RW several times a day. Attempted use of knee scooter today however pt was unable to tolerate position of LLE on knee platform due to pain. HHPT recommended. **  Follow Up Recommendations  Supervision/Assistance - 24 hour;Home health PT     Equipment Recommendations  None recommended by PT    Recommendations for Other Services       Precautions / Restrictions Precautions Precautions: Fall Restrictions Weight Bearing Restrictions: Yes LLE Weight Bearing: Non weight bearing    Mobility  Bed Mobility Overal bed mobility: Needs Assistance Bed Mobility: Supine to Sit     Supine to sit: HOB elevated;Modified independent (Device/Increase time) Sit to supine: Min guard   General bed mobility comments:  Increased time.   Transfers Overall transfer level: Needs assistance Equipment used: Rolling walker (2 wheeled) Transfers: Sit to/from Stand Sit to Stand: Min guard         General transfer comment: cues for hand placement  Ambulation/Gait Ambulation/Gait assistance: Min guard Ambulation Distance (Feet): 10 Feet Assistive device: Rolling walker (2 wheeled) Gait Pattern/deviations: Step-to pattern   Gait velocity interpretation: Below normal speed for age/gender General Gait Details: increased time, distance limited by pain; tried knee scooter but pt was unable to position LLE on knee platform due to pain   Stairs            Wheelchair Mobility    Modified Rankin (Stroke Patients Only)       Balance                                    Cognition Arousal/Alertness:  Awake/alert Behavior During Therapy: WFL for tasks assessed/performed Overall Cognitive Status: Within Functional Limits for tasks assessed                      Exercises General Exercises - Lower Extremity Long Arc Quad: AROM;Left;10 reps;Seated Hip Flexion/Marching: AROM;Left;5 reps;Seated    General Comments        Pertinent Vitals/Pain Pain Score: 8  Pain Location: L ankle Pain Descriptors / Indicators: Sore Pain Intervention(s): Limited activity within patient's tolerance;Monitored during session;Premedicated before session    Home Living                      Prior Function            PT Goals (current goals can now be found in the care plan section)      Frequency  Min 5X/week    PT Plan Current plan remains appropriate    Co-evaluation             End of Session Equipment Utilized During Treatment: Gait belt Activity Tolerance: Patient limited by pain Patient left: with call bell/phone within reach;in chair     Time: 0812-0833 PT Time Calculation (min) (ACUTE ONLY): 21 min  Charges:  $Gait Training: 8-22 mins                    G  Codes:      Philomena Doheny 02/23/2014, 9:21 AM [phone]

## 2014-02-23 NOTE — Progress Notes (Signed)
CARE MANAGEMENT NOTE 02/23/2014  Patient:  Carolyn [name], Carolyn [name]   Account Number:  [id number]  Date Initiated:  02/22/2014  Documentation initiated by:  [name]  Subjective/Objective Assessment:   64 yo female admitted s/p ORIF of ankle fx. PTA lived at home alone.     Action/Plan:   Home when stable   Anticipated DC Date:  02/23/2014   Anticipated DC Plan:  [place]  CM consult      [facility] Choice  HOME HEALTH   Choice offered to / List presented to:  [name]        [place] arranged  HH-2 PT      [place]   Status of service:  Completed, signed off Medicare Important Message given?   (If response is "NO", the following Medicare IM given date fields will be blank) Date Medicare IM given:   Medicare IM given by:   Date Additional Medicare IM given:   Additional Medicare IM given by:    Discharge Disposition:  [place]  Per UR Regulation:  Reviewed for med. necessity/level of care/duration of stay  If discussed at [place] of Stay Meetings, dates discussed:    Comments:  02/23/2014 Reece Levy J. [place] Phillippi RN CM [phone]) Choice list offered again. Patient chose [facility], no DME needed has walker, 3 in 1, and crutches in room. TC to [name] for HHPT start of care Monday 02/25/2014. Family will be with patient at home after discharge. No further discharge needs.  02-22-14 [name] RN CM 1200 Spoke with patient at bedside. Patient somewhat drowsy but able to respond appropriately. Discussed recommendations for [facility] therapy. Patient is agreeable, states her son/dtr will be coming to stay with her at d/c. Provided patient with list for choice, she will review and I will f/u later. Discussed with RN to remind patient of list since she was so drowsy.

## 2014-02-23 NOTE — Progress Notes (Signed)
Patient discharged to home.  Reviewed discharge instructions with patient and daughter, both verbalized understanding.  Prescriptions given to patient's daughter.  Patient escorted off unit via wheelchair with NT.  Christen Bame RN

## 2014-02-23 NOTE — Progress Notes (Signed)
Notified Dr. Gladstone Lighter about pt's elevated irregular heart rate. EKG done. Pt is asymptomatic. No chest pain. No shortness of breath. Pt has history of atrial fibrillation and she has pacemaker. He stated to monitor patient. Will connect patient to continuous pulse oximetry. Will continue to monitor.

## 2014-02-24 NOTE — Discharge Summary (Signed)
Physician Discharge Summary   Patient ID: Carolyn [name] MRN: [id number] DOB/AGE: 62-Oct-1952 62 y.o.  Admit date: 02/21/2014 Discharge date: 02/23/2014  Primary Diagnosis: Trimalleolar ankle fracture, left ankle with history of dislocation and successful reduction   Admission Diagnoses:  Past Medical History  Diagnosis Date  . PAF (paroxysmal atrial fibrillation)   . HTN (hypertension)   . DM2 (diabetes mellitus, type 2)   . HLD (hyperlipidemia)   . Obesity   . Hypothyroidism   . Carpal tunnel syndrome   . Heart failure   . GERD (gastroesophageal reflux disease)   . Arthritis     Osteoarthritis  . Sleep apnea     No cpap used  . CHF (congestive heart failure)     08-2013  . Presence of permanent cardiac pacemaker     09-24-13 01-21-14 Last interrogation in Epic at [facility]   Discharge Diagnoses:   Active Problems:   Trimalleolar ankle fracture  Estimated body mass index is 40.66 kg/(m^2) as calculated from the following:   Height as of this encounter: 5' 4" (1.626 m).   Weight as of this encounter: 107.502 kg (237 lb).  Procedure:  Procedure(s) (LRB): OPEN REDUCTION INTERNAL FIXATION (ORIF) LEFT  ANKLE FRACTURE (Left)   Consults: None  HPI: The patient is a 64 year old female who presented with the chief complaint of left ankle pain. She presented to the office with her daughter. She reports that on 02/18/2014 she was at home when she fell after feeling lightheaded. She reports that she thinks her sugar was low. She says that it felt as if her legs buckled underneath her. She went to the ER at [facility] where that x-ray her left ankle, noting a fracture/dislocation. They successfully reduced the ankle and placed her in a posterior splint. She has been NWB since the incident. She is on anticoagulation in the form of Pradaxa but reports that her last dose was on 02/18/2014.   Laboratory Data: Admission on 02/21/2014, Discharged on 02/23/2014  Component  Date Value Ref Range Status  . aPTT 02/21/2014 39* 24 - 37 seconds Final   Comment:        IF BASELINE aPTT IS ELEVATED, SUGGEST PATIENT RISK ASSESSMENT BE USED TO DETERMINE APPROPRIATE ANTICOAGULANT THERAPY.   . WBC 02/21/2014 8.3  4.0 - 10.5 K/uL Final  . RBC 02/21/2014 3.10* 3.87 - 5.11 MIL/uL Final  . Hemoglobin 02/21/2014 9.1* 12.0 - 15.0 g/dL Final  . HCT 02/21/2014 27.6* 36.0 - 46.0 % Final  . MCV 02/21/2014 89.0  78.0 - 100.0 fL Final  . MCH 02/21/2014 29.4  26.0 - 34.0 pg Final  . MCHC 02/21/2014 33.0  30.0 - 36.0 g/dL Final  . RDW 02/21/2014 14.0  11.5 - 15.5 % Final  . Platelets 02/21/2014 171  150 - 400 K/uL Final  . Neutrophils Relative % 02/21/2014 61  43 - 77 % Final  . Neutro Abs 02/21/2014 5.1  1.7 - 7.7 K/uL Final  . Lymphocytes Relative 02/21/2014 27  12 - 46 % Final  . Lymphs Abs 02/21/2014 2.2  0.7 - 4.0 K/uL Final  . Monocytes Relative 02/21/2014 8  3 - 12 % Final  . Monocytes Absolute 02/21/2014 0.6  0.1 - 1.0 K/uL Final  . Eosinophils Relative 02/21/2014 3  0 - 5 % Final  . Eosinophils Absolute 02/21/2014 0.3  0.0 - 0.7 K/uL Final  . Basophils Relative 02/21/2014 1  0 - 1 % Final  . Basophils Absolute  02/21/2014 0.0  0.0 - 0.1 K/uL Final  . Sodium 02/21/2014 139  135 - 145 mmol/L Final  . Potassium 02/21/2014 4.0  3.5 - 5.1 mmol/L Final  . Chloride 02/21/2014 105  96 - 112 mmol/L Final  . CO2 02/21/2014 24  19 - 32 mmol/L Final  . Glucose, Bld 02/21/2014 122* 70 - 99 mg/dL Final  . BUN 02/21/2014 36* 6 - 23 mg/dL Final  . Creatinine, Ser 02/21/2014 1.49* 0.50 - 1.10 mg/dL Final  . Calcium 02/21/2014 8.4  8.4 - 10.5 mg/dL Final  . Total Protein 02/21/2014 6.7  6.0 - 8.3 g/dL Final  . Albumin 02/21/2014 3.5  3.5 - 5.2 g/dL Final  . AST 02/21/2014 17  0 - 37 U/L Final  . ALT 02/21/2014 15  0 - 35 U/L Final  . Alkaline Phosphatase 02/21/2014 76  39 - 117 U/L Final  . Total Bilirubin 02/21/2014 2.5* 0.3 - 1.2 mg/dL Final  . GFR calc non Af Amer 02/21/2014  37* >90 mL/min Final  . GFR calc Af Amer 02/21/2014 43* >90 mL/min Final   Comment: (NOTE) The eGFR has been calculated using the CKD EPI equation. This calculation has not been validated in all clinical situations. eGFR's persistently <90 mL/min signify possible Chronic Kidney Disease.   . Anion gap 02/21/2014 10  5 - 15 Final  . Prothrombin Time 02/21/2014 17.4* 11.6 - 15.2 seconds Final  . INR 02/21/2014 1.41  0.00 - 1.49 Final  . Color, Urine 02/21/2014 YELLOW  YELLOW Final  . APPearance 02/21/2014 CLOUDY* CLEAR Final  . Specific Gravity, Urine 02/21/2014 1.011  1.005 - 1.030 Final  . pH 02/21/2014 5.0  5.0 - 8.0 Final  . Glucose, UA 02/21/2014 NEGATIVE  NEGATIVE mg/dL Final  . Hgb urine dipstick 02/21/2014 SMALL* NEGATIVE Final  . Bilirubin Urine 02/21/2014 NEGATIVE  NEGATIVE Final  . Ketones, ur 02/21/2014 NEGATIVE  NEGATIVE mg/dL Final  . Protein, ur 02/21/2014 30* NEGATIVE mg/dL Final  . Urobilinogen, UA 02/21/2014 0.2  0.0 - 1.0 mg/dL Final  . Nitrite 02/21/2014 NEGATIVE  NEGATIVE Final  . Leukocytes, UA 02/21/2014 TRACE* NEGATIVE Final  . Glucose-Capillary 02/21/2014 99  70 - 99 mg/dL Final  . Squamous Epithelial / LPF 02/21/2014 FEW* RARE Final  . WBC, UA 02/21/2014 0-2  <3 WBC/hpf Final  . RBC / HPF 02/21/2014 0-2  <3 RBC/hpf Final  . Urine-Other 02/21/2014 MUCOUS PRESENT   Final  . Glucose-Capillary 02/21/2014 154* 70 - 99 mg/dL Final  . Comment 1 02/21/2014 Documented in Chart   Final  . Comment 2 02/21/2014 Notify RN   Final  . Sodium 02/22/2014 137  135 - 145 mmol/L Final  . Potassium 02/22/2014 5.0  3.5 - 5.1 mmol/L Final   Comment: DELTA CHECK NOTED REPEATED TO VERIFY NO VISIBLE HEMOLYSIS   . Chloride 02/22/2014 104  96 - 112 mmol/L Final  . CO2 02/22/2014 25  19 - 32 mmol/L Final  . Glucose, Bld 02/22/2014 212* 70 - 99 mg/dL Final  . BUN 02/22/2014 32* 6 - 23 mg/dL Final  . Creatinine, Ser 02/22/2014 1.39* 0.50 - 1.10 mg/dL Final  . Calcium 02/22/2014 8.0*  8.4 - 10.5 mg/dL Final  . GFR calc non Af Amer 02/22/2014 40* >90 mL/min Final  . GFR calc Af Amer 02/22/2014 46* >90 mL/min Final   Comment: (NOTE) The eGFR has been calculated using the CKD EPI equation. This calculation has not been validated in all clinical situations. eGFR's persistently <90 mL/min signify  possible Chronic Kidney Disease.   . Anion gap 02/22/2014 8  5 - 15 Final  . Glucose-Capillary 02/21/2014 156* 70 - 99 mg/dL Final  . Comment 1 02/21/2014 Notify RN   Final  . Comment 2 02/21/2014 Documented in Chart   Final  . WBC 02/22/2014 9.8  4.0 - 10.5 K/uL Final  . RBC 02/22/2014 3.18* 3.87 - 5.11 MIL/uL Final  . Hemoglobin 02/22/2014 9.3* 12.0 - 15.0 g/dL Final  . HCT 02/22/2014 29.3* 36.0 - 46.0 % Final  . MCV 02/22/2014 92.1  78.0 - 100.0 fL Final  . MCH 02/22/2014 29.2  26.0 - 34.0 pg Final  . MCHC 02/22/2014 31.7  30.0 - 36.0 g/dL Final  . RDW 02/22/2014 14.0  11.5 - 15.5 % Final  . Platelets 02/22/2014 250  150 - 400 K/uL Final   Comment: DELTA CHECK NOTED SPECIMEN CHECKED FOR CLOTS   . Neutrophils Relative % 02/22/2014 67  43 - 77 % Final  . Neutro Abs 02/22/2014 6.5  1.7 - 7.7 K/uL Final  . Lymphocytes Relative 02/22/2014 22  12 - 46 % Final  . Lymphs Abs 02/22/2014 2.2  0.7 - 4.0 K/uL Final  . Monocytes Relative 02/22/2014 9  3 - 12 % Final  . Monocytes Absolute 02/22/2014 0.9  0.1 - 1.0 K/uL Final  . Eosinophils Relative 02/22/2014 1  0 - 5 % Final  . Eosinophils Absolute 02/22/2014 0.1  0.0 - 0.7 K/uL Final  . Basophils Relative 02/22/2014 0  0 - 1 % Final  . Basophils Absolute 02/22/2014 0.0  0.0 - 0.1 K/uL Final  . Glucose-Capillary 02/22/2014 164* 70 - 99 mg/dL Final  . Glucose-Capillary 02/22/2014 137* 70 - 99 mg/dL Final  . Sodium 02/23/2014 142  135 - 145 mmol/L Final  . Potassium 02/23/2014 4.8  3.5 - 5.1 mmol/L Final   Comment: SLIGHT HEMOLYSIS HEMOLYSIS AT THIS LEVEL MAY AFFECT RESULT   . Chloride 02/23/2014 107  96 - 112 mmol/L Final  .  CO2 02/23/2014 27  19 - 32 mmol/L Final  . Glucose, Bld 02/23/2014 151* 70 - 99 mg/dL Final  . BUN 02/23/2014 23  6 - 23 mg/dL Final  . Creatinine, Ser 02/23/2014 1.11* 0.50 - 1.10 mg/dL Final  . Calcium 02/23/2014 8.3* 8.4 - 10.5 mg/dL Final  . GFR calc non Af Amer 02/23/2014 52* >90 mL/min Final  . GFR calc Af Amer 02/23/2014 61* >90 mL/min Final   Comment: (NOTE) The eGFR has been calculated using the CKD EPI equation. This calculation has not been validated in all clinical situations. eGFR's persistently <90 mL/min signify possible Chronic Kidney Disease.   . Anion gap 02/23/2014 8  5 - 15 Final  . Glucose-Capillary 02/22/2014 230* 70 - 99 mg/dL Final  . WBC 02/23/2014 7.9  4.0 - 10.5 K/uL Final  . RBC 02/23/2014 3.01* 3.87 - 5.11 MIL/uL Final  . Hemoglobin 02/23/2014 8.8* 12.0 - 15.0 g/dL Final  . HCT 02/23/2014 27.5* 36.0 - 46.0 % Final  . MCV 02/23/2014 91.4  78.0 - 100.0 fL Final  . MCH 02/23/2014 29.2  26.0 - 34.0 pg Final  . MCHC 02/23/2014 32.0  30.0 - 36.0 g/dL Final  . RDW 02/23/2014 13.8  11.5 - 15.5 % Final  . Platelets 02/23/2014 236  150 - 400 K/uL Final  . Neutrophils Relative % 02/23/2014 61  43 - 77 % Final  . Neutro Abs 02/23/2014 4.7  1.7 - 7.7 K/uL Final  . Lymphocytes Relative 02/23/2014  27  12 - 46 % Final  . Lymphs Abs 02/23/2014 2.1  0.7 - 4.0 K/uL Final  . Monocytes Relative 02/23/2014 11  3 - 12 % Final  . Monocytes Absolute 02/23/2014 0.9  0.1 - 1.0 K/uL Final  . Eosinophils Relative 02/23/2014 1  0 - 5 % Final  . Eosinophils Absolute 02/23/2014 0.1  0.0 - 0.7 K/uL Final  . Basophils Relative 02/23/2014 0  0 - 1 % Final  . Basophils Absolute 02/23/2014 0.0  0.0 - 0.1 K/uL Final  . Glucose-Capillary 02/22/2014 170* 70 - 99 mg/dL Final  . Glucose-Capillary 02/23/2014 114* 70 - 99 mg/dL Final  . Glucose-Capillary 02/23/2014 197* 70 - 99 mg/dL Final     X-Rays:Dg Ankle Complete Left  02/21/2014   CLINICAL DATA:  Operative reduction and internal  fixation of the left ankle. Elective ankle surgery. Trimalleolar fracture 02/18/2014.  EXAM: DG C-ARM 1-60 MIN - NRPT [facility]; LEFT ANKLE COMPLETE - 3+ VIEW  COMPARISON:  02/18/2014  FINDINGS: Prior high-grade Weber B fracture demonstrates internal fixation with lateral plate and screw fixator. Several cancellous screws track through both the distal fibula and the distal tibia. Near-anatomic alignment and positioning with mortise spacing normal. Posterior malleolar fracture observed.  IMPRESSION: 1. Ankle ORIF, with near-anatomic alignment and positioning.   Electronically Signed   By: Sherryl Barters M.D.   On: 02/21/2014 18:57   Dg C-arm 1-60 Min-no Report  02/21/2014   CLINICAL DATA:  Operative reduction and internal fixation of the left ankle. Elective ankle surgery. Trimalleolar fracture 02/18/2014.  EXAM: DG C-ARM 1-60 MIN - [facility]PT [facility]; LEFT ANKLE COMPLETE - 3+ VIEW  COMPARISON:  02/18/2014  FINDINGS: Prior high-grade Weber B fracture demonstrates internal fixation with lateral plate and screw fixator. Several cancellous screws track through both the distal fibula and the distal tibia. Near-anatomic alignment and positioning with mortise spacing normal. Posterior malleolar fracture observed.  IMPRESSION: 1. Ankle ORIF, with near-anatomic alignment and positioning.   Electronically Signed   By: Sherryl Barters M.D.   On: 02/21/2014 18:57     Hospital Course: Carolyn [name] is a 64 y.o. who was admitted to [facility]. They were brought to the operating room on 02/21/2014 and underwent Procedure(s): OPEN REDUCTION INTERNAL FIXATION (ORIF) LEFT  ANKLE FRACTURE.  Patient tolerated the procedure well and was later transferred to the recovery room and then to the orthopaedic floor for postoperative care.  They were given PO and IV analgesics for pain control following their surgery.  They were given 24 hours of postoperative antibiotics of  Anti-infectives    Start     Dose/Rate Route Frequency  Ordered Stop   02/22/14 0600  ceFAZolin (ANCEF) IVPB 2 g/50 mL premix     2 g100 mL/hr over 30 Minutes Intravenous On call to O.R. 02/21/14 1315 02/21/14 1621   02/21/14 2200  ceFAZolin (ANCEF) IVPB 1 g/50 mL premix     1 g100 mL/hr over 30 Minutes Intravenous Every 6 hours 02/21/14 2052 02/22/14 1023     and started on DVT prophylaxis in the form of Pradaxa which she was on prior to surgery.   PT was ordered in order to ambulate the patient. Discharge planning consulted to help with postop disposition and equipment needs.  Patient had a fair night on the evening of surgery.  They started to get up OOB with therapy on day one. She had some difficulty with the scooter and PT recommended HHPT. Continued to work with therapy  into day two. Patient was seen in rounds and was ready to go home.   Diet: Diabetic diet Activity:NWB Follow-up:in 3 days Disposition - Home Discharged Condition: stable   Discharge Instructions    Call MD / Call 911    Complete by:  As directed   If you experience chest pain or shortness of breath, CALL 911 and be transported to the hospital emergency room.  If you develope a fever above 101 F, pus (white drainage) or increased drainage or redness at the wound, or calf pain, call your surgeon's office.     Constipation Prevention    Complete by:  As directed   Drink plenty of fluids.  Prune juice may be helpful.  You may use a stool softener, such as Colace (over the counter) 100 mg twice a day.  Use MiraLax (over the counter) for constipation as needed.     Diet Carb Modified    Complete by:  As directed      Discharge instructions    Complete by:  As directed   Nonweightbearing left leg/foot Call if any temperatures greater than 101 or any wound complications: [phone] during the day and ask for Dr. Charlestine Night nurse, Brunilda Payor.     Driving restrictions    Complete by:  As directed   No driving     Non weight bearing    Complete by:  As directed   Laterality:   left  Extremity:  Lower            Medication List    TAKE these medications        atorvastatin 20 MG tablet  Commonly known as:  LIPITOR  Take 20 mg by mouth daily.     benazepril 10 MG tablet  Commonly known as:  LOTENSIN  Take 10 mg by mouth daily.     dabigatran 75 MG Caps capsule  Commonly known as:  PRADAXA  Take 75 mg by mouth 2 (two) times daily.     escitalopram 20 MG tablet  Commonly known as:  LEXAPRO  Take 20 mg by mouth daily.     furosemide 20 MG tablet  Commonly known as:  LASIX  Take 20 mg by mouth 2 (two) times daily.     insulin detemir 100 UNIT/ML injection  Commonly known as:  LEVEMIR  Inject 24 Units into the skin at bedtime.     levothyroxine 150 MCG tablet  Commonly known as:  SYNTHROID, LEVOTHROID  Take 150 mcg by mouth daily.     Liraglutide 18 MG/3ML Sopn  Inject 0.6 mg into the skin daily.     methocarbamol 500 MG tablet  Commonly known as:  ROBAXIN  Take 1 tablet (500 mg total) by mouth every 6 (six) hours as needed for muscle spasms.     metoprolol 50 MG tablet  Commonly known as:  LOPRESSOR  Take 50 mg by mouth 2 (two) times daily.     oxyCODONE 5 MG immediate release tablet  Commonly known as:  Oxy IR/ROXICODONE  Take 1-2 tablets (5-10 mg total) by mouth every 3 (three) hours as needed for severe pain.     pantoprazole 40 MG tablet  Commonly known as:  PROTONIX  Take 40 mg by mouth daily.           Follow-up Information    Follow up with GIOFFRE,RONALD A, MD. Schedule an appointment as soon as possible for a visit on 02/26/2014.   Specialty:  Orthopedic Surgery  Contact information:   [address] [place] [address] [phone]       Please follow up.   Why:  HHPT   Contact information:   [facility] [address] [place], [place] [address] [phone]      Signed: Ardeen Jourdain, PA-C Orthopaedic Surgery 02/24/2014, 9:47 AM

## 2014-02-25 ENCOUNTER — Encounter (HOSPITAL_COMMUNITY): Payer: Self-pay | Admitting: Orthopedic Surgery

## 2014-02-25 NOTE — Progress Notes (Signed)
Discharge summary sent to payer through [employer]  

## 2014-11-21 DIAGNOSIS — Z950 Presence of cardiac pacemaker: Secondary | ICD-10-CM

## 2014-11-21 DIAGNOSIS — I495 Sick sinus syndrome: Secondary | ICD-10-CM

## 2014-11-21 DIAGNOSIS — N183 Chronic kidney disease, stage 3 unspecified: Secondary | ICD-10-CM | POA: Insufficient documentation

## 2014-11-21 HISTORY — DX: Chronic kidney disease, stage 3 unspecified: N18.30

## 2014-11-21 HISTORY — DX: Presence of cardiac pacemaker: Z95.0

## 2014-11-21 HISTORY — DX: Sick sinus syndrome: I49.5

## 2014-12-03 DIAGNOSIS — Z7901 Long term (current) use of anticoagulants: Secondary | ICD-10-CM

## 2014-12-03 DIAGNOSIS — I110 Hypertensive heart disease with heart failure: Secondary | ICD-10-CM | POA: Insufficient documentation

## 2014-12-03 DIAGNOSIS — I5032 Chronic diastolic (congestive) heart failure: Secondary | ICD-10-CM

## 2014-12-03 HISTORY — DX: Long term (current) use of anticoagulants: Z79.01

## 2014-12-03 HISTORY — DX: Chronic diastolic (congestive) heart failure: I50.32

## 2014-12-03 HISTORY — DX: Hypertensive heart disease with heart failure: I11.0

## 2015-05-06 DIAGNOSIS — E785 Hyperlipidemia, unspecified: Secondary | ICD-10-CM

## 2015-05-06 DIAGNOSIS — R809 Proteinuria, unspecified: Secondary | ICD-10-CM | POA: Insufficient documentation

## 2015-05-06 DIAGNOSIS — E1129 Type 2 diabetes mellitus with other diabetic kidney complication: Secondary | ICD-10-CM

## 2015-05-06 DIAGNOSIS — I10 Essential (primary) hypertension: Secondary | ICD-10-CM

## 2015-05-06 DIAGNOSIS — E669 Obesity, unspecified: Secondary | ICD-10-CM

## 2015-05-06 HISTORY — DX: Essential (primary) hypertension: I10

## 2015-05-06 HISTORY — DX: Hyperlipidemia, unspecified: E78.5

## 2015-05-06 HISTORY — DX: Obesity, unspecified: E66.9

## 2015-05-06 HISTORY — DX: Type 2 diabetes mellitus with other diabetic kidney complication: E11.29

## 2015-08-25 DIAGNOSIS — E1122 Type 2 diabetes mellitus with diabetic chronic kidney disease: Secondary | ICD-10-CM

## 2015-08-25 HISTORY — DX: Type 2 diabetes mellitus with diabetic chronic kidney disease: E11.22

## 2016-07-07 DIAGNOSIS — G4733 Obstructive sleep apnea (adult) (pediatric): Secondary | ICD-10-CM

## 2016-07-07 DIAGNOSIS — F39 Unspecified mood [affective] disorder: Secondary | ICD-10-CM | POA: Insufficient documentation

## 2016-07-07 DIAGNOSIS — M545 Low back pain, unspecified: Secondary | ICD-10-CM | POA: Insufficient documentation

## 2016-07-07 DIAGNOSIS — H8110 Benign paroxysmal vertigo, unspecified ear: Secondary | ICD-10-CM

## 2016-07-07 DIAGNOSIS — J449 Chronic obstructive pulmonary disease, unspecified: Secondary | ICD-10-CM

## 2016-07-07 DIAGNOSIS — R519 Headache, unspecified: Secondary | ICD-10-CM

## 2016-07-07 DIAGNOSIS — R51 Headache: Secondary | ICD-10-CM

## 2016-07-07 DIAGNOSIS — K591 Functional diarrhea: Secondary | ICD-10-CM | POA: Insufficient documentation

## 2016-07-07 DIAGNOSIS — J40 Bronchitis, not specified as acute or chronic: Secondary | ICD-10-CM

## 2016-07-07 DIAGNOSIS — E1165 Type 2 diabetes mellitus with hyperglycemia: Secondary | ICD-10-CM

## 2016-07-07 DIAGNOSIS — M792 Neuralgia and neuritis, unspecified: Secondary | ICD-10-CM

## 2016-07-07 DIAGNOSIS — J309 Allergic rhinitis, unspecified: Secondary | ICD-10-CM

## 2016-07-07 DIAGNOSIS — R600 Localized edema: Secondary | ICD-10-CM

## 2016-07-07 DIAGNOSIS — J310 Chronic rhinitis: Secondary | ICD-10-CM

## 2016-07-07 DIAGNOSIS — E663 Overweight: Secondary | ICD-10-CM | POA: Insufficient documentation

## 2016-07-07 DIAGNOSIS — F419 Anxiety disorder, unspecified: Secondary | ICD-10-CM

## 2016-07-07 DIAGNOSIS — E039 Hypothyroidism, unspecified: Secondary | ICD-10-CM

## 2016-07-07 DIAGNOSIS — K2900 Acute gastritis without bleeding: Secondary | ICD-10-CM

## 2016-07-07 DIAGNOSIS — K2100 Gastro-esophageal reflux disease with esophagitis, without bleeding: Secondary | ICD-10-CM

## 2016-07-07 DIAGNOSIS — M1990 Unspecified osteoarthritis, unspecified site: Secondary | ICD-10-CM | POA: Insufficient documentation

## 2016-07-07 DIAGNOSIS — IMO0002 Reserved for concepts with insufficient information to code with codable children: Secondary | ICD-10-CM | POA: Insufficient documentation

## 2016-07-07 DIAGNOSIS — F17200 Nicotine dependence, unspecified, uncomplicated: Secondary | ICD-10-CM | POA: Insufficient documentation

## 2016-07-07 DIAGNOSIS — K219 Gastro-esophageal reflux disease without esophagitis: Secondary | ICD-10-CM | POA: Insufficient documentation

## 2016-07-07 DIAGNOSIS — R0789 Other chest pain: Secondary | ICD-10-CM

## 2016-07-07 DIAGNOSIS — G5600 Carpal tunnel syndrome, unspecified upper limb: Secondary | ICD-10-CM | POA: Insufficient documentation

## 2016-07-07 DIAGNOSIS — I119 Hypertensive heart disease without heart failure: Secondary | ICD-10-CM | POA: Insufficient documentation

## 2016-07-07 DIAGNOSIS — Z8719 Personal history of other diseases of the digestive system: Secondary | ICD-10-CM | POA: Insufficient documentation

## 2016-07-07 DIAGNOSIS — J302 Other seasonal allergic rhinitis: Secondary | ICD-10-CM

## 2016-07-07 DIAGNOSIS — K589 Irritable bowel syndrome without diarrhea: Secondary | ICD-10-CM

## 2016-07-07 DIAGNOSIS — R609 Edema, unspecified: Secondary | ICD-10-CM

## 2016-07-07 DIAGNOSIS — F331 Major depressive disorder, recurrent, moderate: Secondary | ICD-10-CM

## 2016-07-07 DIAGNOSIS — N951 Menopausal and female climacteric states: Secondary | ICD-10-CM

## 2016-07-07 DIAGNOSIS — K439 Ventral hernia without obstruction or gangrene: Secondary | ICD-10-CM

## 2016-07-07 DIAGNOSIS — D649 Anemia, unspecified: Secondary | ICD-10-CM

## 2016-07-07 DIAGNOSIS — I10 Essential (primary) hypertension: Secondary | ICD-10-CM

## 2016-07-07 DIAGNOSIS — E1129 Type 2 diabetes mellitus with other diabetic kidney complication: Secondary | ICD-10-CM

## 2016-07-07 DIAGNOSIS — M779 Enthesopathy, unspecified: Secondary | ICD-10-CM

## 2016-07-07 DIAGNOSIS — J329 Chronic sinusitis, unspecified: Secondary | ICD-10-CM

## 2016-07-07 DIAGNOSIS — M546 Pain in thoracic spine: Secondary | ICD-10-CM

## 2016-07-07 HISTORY — DX: Chronic sinusitis, unspecified: J32.9

## 2016-07-07 HISTORY — DX: Obstructive sleep apnea (adult) (pediatric): G47.33

## 2016-07-07 HISTORY — DX: Hypothyroidism, unspecified: E03.9

## 2016-07-07 HISTORY — DX: Unspecified osteoarthritis, unspecified site: M19.90

## 2016-07-07 HISTORY — DX: Enthesopathy, unspecified: M77.9

## 2016-07-07 HISTORY — DX: Edema, unspecified: R60.9

## 2016-07-07 HISTORY — DX: Low back pain, unspecified: M54.50

## 2016-07-07 HISTORY — DX: Major depressive disorder, recurrent, moderate: F33.1

## 2016-07-07 HISTORY — DX: Type 2 diabetes mellitus with other diabetic kidney complication: E11.29

## 2016-07-07 HISTORY — DX: Overweight: E66.3

## 2016-07-07 HISTORY — DX: Benign paroxysmal vertigo, unspecified ear: H81.10

## 2016-07-07 HISTORY — DX: Other chest pain: R07.89

## 2016-07-07 HISTORY — DX: Chronic rhinitis: J31.0

## 2016-07-07 HISTORY — DX: Bronchitis, not specified as acute or chronic: J40

## 2016-07-07 HISTORY — DX: Chronic obstructive pulmonary disease, unspecified: J44.9

## 2016-07-07 HISTORY — DX: Essential (primary) hypertension: I10

## 2016-07-07 HISTORY — DX: Other seasonal allergic rhinitis: J30.2

## 2016-07-07 HISTORY — DX: Personal history of other diseases of the digestive system: Z87.19

## 2016-07-07 HISTORY — DX: Anxiety disorder, unspecified: F41.9

## 2016-07-07 HISTORY — DX: Pain in thoracic spine: M54.6

## 2016-07-07 HISTORY — DX: Neuralgia and neuritis, unspecified: M79.2

## 2016-07-07 HISTORY — DX: Gastro-esophageal reflux disease with esophagitis, without bleeding: K21.00

## 2016-07-07 HISTORY — DX: Irritable bowel syndrome, unspecified: K58.9

## 2016-07-07 HISTORY — DX: Hypertensive heart disease without heart failure: I11.9

## 2016-07-07 HISTORY — DX: Nicotine dependence, unspecified, uncomplicated: F17.200

## 2016-07-07 HISTORY — DX: Menopausal and female climacteric states: N95.1

## 2016-07-07 HISTORY — DX: Anemia, unspecified: D64.9

## 2016-07-07 HISTORY — DX: Acute gastritis without bleeding: K29.00

## 2016-07-07 HISTORY — DX: Ventral hernia without obstruction or gangrene: K43.9

## 2016-07-07 HISTORY — DX: Headache, unspecified: R51.9

## 2016-07-07 HISTORY — DX: Reserved for concepts with insufficient information to code with codable children: IMO0002

## 2016-07-07 HISTORY — DX: Allergic rhinitis, unspecified: J30.9

## 2016-07-07 HISTORY — DX: Functional diarrhea: K59.1

## 2016-07-07 HISTORY — DX: Localized edema: R60.0

## 2016-10-26 ENCOUNTER — Other Ambulatory Visit: Payer: Self-pay

## 2016-10-26 MED ORDER — ATORVASTATIN CALCIUM 20 MG PO TABS: 20.0000 mg | ORAL_TABLET | Freq: Every day | ORAL | 3 refills | Status: DC

## 2016-10-26 MED ORDER — APIXABAN 5 MG PO TABS: 5.0000 mg | ORAL_TABLET | Freq: Two times a day (BID) | ORAL | 3 refills | Status: DC

## 2016-11-08 ENCOUNTER — Encounter: Payer: Self-pay | Admitting: *Deleted

## 2016-11-16 ENCOUNTER — Other Ambulatory Visit: Payer: Self-pay | Admitting: *Deleted

## 2016-11-17 ENCOUNTER — Other Ambulatory Visit: Payer: Self-pay | Admitting: *Deleted

## 2016-11-22 ENCOUNTER — Ambulatory Visit: Payer: BLUE CROSS/BLUE SHIELD | Admitting: Cardiology

## 2016-11-22 ENCOUNTER — Encounter: Payer: Self-pay | Admitting: Cardiology

## 2016-11-22 ENCOUNTER — Ambulatory Visit (INDEPENDENT_AMBULATORY_CARE_PROVIDER_SITE_OTHER): Payer: BLUE CROSS/BLUE SHIELD | Admitting: Cardiology

## 2016-11-22 VITALS — BP 106/74 | HR 77 | Ht 64.0 in | Wt 218.0 lb

## 2016-11-22 DIAGNOSIS — E785 Hyperlipidemia, unspecified: Secondary | ICD-10-CM | POA: Diagnosis not present

## 2016-11-22 DIAGNOSIS — I480 Paroxysmal atrial fibrillation: Secondary | ICD-10-CM

## 2016-11-22 DIAGNOSIS — I5032 Chronic diastolic (congestive) heart failure: Secondary | ICD-10-CM

## 2016-11-22 DIAGNOSIS — I495 Sick sinus syndrome: Secondary | ICD-10-CM | POA: Diagnosis not present

## 2016-11-22 DIAGNOSIS — Z950 Presence of cardiac pacemaker: Secondary | ICD-10-CM | POA: Diagnosis not present

## 2016-11-22 DIAGNOSIS — I110 Hypertensive heart disease with heart failure: Secondary | ICD-10-CM | POA: Diagnosis not present

## 2016-11-22 MED ORDER — METOPROLOL TARTRATE 25 MG PO TABS: 25.0000 mg | ORAL_TABLET | Freq: Two times a day (BID) | ORAL | 3 refills | Status: DC

## 2016-11-22 NOTE — Patient Instructions (Addendum)
Medication Instructions:  Your physician has recommended you make the following change in your medication:  STOP bupropion, gabapentin, clonazepam, pradaxa, lasix, robaxin, oxycodone  Labwork: Your physician recommends that you return for lab work in: today. CMP, lipid  Testing/Procedures: None  Follow-Up: Your physician wants you to follow-up in: 9 months. You will receive a reminder letter in the mail two months in advance. If you don't receive a letter, please call our office to schedule the follow-up appointment.  You will receive a phone call to schedule with electrophysiology  Any Other Special Instructions Will Be Listed Below (If Applicable).     If you need a refill on your cardiac medications before your next appointment, please call your pharmacy.

## 2016-11-22 NOTE — Progress Notes (Signed)
Cardiology Office Note:    Date:  11/22/2016   ID:  Carolyn [name], DOB [birth year], MRN [id number]  PCP:  Myrlene Broker, MD  Cardiologist:  Shirlee More, MD    Referring MD: No ref. provider found    ASSESSMENT:    1. Paroxysmal atrial fibrillation (HCC)   2. Hypertensive heart disease with chronic diastolic congestive heart failure ([place])   3. Chronic diastolic heart failure ([place])   4. SSS (sick sinus syndrome) ([place])   5. Pacemaker   6. Hyperlipidemia, unspecified hyperlipidemia type    PLAN:    In order of problems listed above:  1. Stable, continue beta-blocker no longer takes an antiarrhythmic drug.  After discussion of risk and benefits she elects to continue anticoagulation moderate risk of stroke and a history of atrial fibrillation. 2. Improved stable compensated will take a diuretic as needed in the future 3. See above 4. Stable asymptomatic after pacemaker for bradycardia cryoablation for atrial fibrillation 5. Stable she will transition to pacemaker EP care my practice 6. Stable, continue statin check liver function for toxicity and LDL for efficacy   Next appointment: 6 months   Medication Adjustments/Labs and Tests Ordered: Current medicines are reviewed at length with the patient today.  Concerns regarding medicines are outlined above.  Orders Placed This Encounter  Procedures  . Comprehensive Metabolic Panel (CMET)  . Lipid panel  . Ambulatory referral to Cardiac Electrophysiology  . EKG 12-Lead   Meds ordered this encounter  Medications  . metoprolol tartrate (LOPRESSOR) 25 MG tablet    Sig: Take 1 tablet (25 mg total) by mouth 2 (two) times daily.    Dispense:  180 tablet    Refill:  3    Chief Complaint  Patient presents with  . Follow-up    1 year  . Atrial Fibrillation    History of Present Illness:    Carolyn [name] is a 66 y.o. female with a hx of paroxysmal atrial fibrillation CHADS2 vasc of 4 , hypertension, diabetes, and  history of hyperthyroidism that was treated and she is now on supplement. She underwent cryo-balloon ablation of atrial fibrillation in December 2017. She has been maintained on Multaq,discontinued in July 2018. She has a dual-chamber pacemaker in placed for sinus node dysfunction last seen in March 2018.Marland Kitchen Compliance with diet, lifestyle and medications: Yes She is pleased with the quality of her life feels well has no exercise intolerance dyspnea syncope TIA or bleeding complication from her anticoagulant.  For edema she takes a dose of diuretic every 3 months. Past Medical History:  Diagnosis Date  . Acquired hypothyroidism 07/07/2016  . Acute gastritis 07/07/2016  . Allergic rhinitis 07/07/2016  . Anemia 07/07/2016  . Anxiety 07/07/2016  . Arthritis    Osteoarthritis  . ATRIAL FIBRILLATION 11/27/2008   Qualifier: Diagnosis of  By: Genevie Cheshire PharmD, Gay Filler    . Atypical chest pain 07/07/2016  . Benign essential hypertension 07/07/2016   Overview:  stable on current meds  . Benign paroxysmal positional vertigo 07/07/2016   Overview:  Avoid rapid headmovement and position change. May taper the gabapentin to see if helps.  . Bilateral low back pain without sciatica 07/07/2016   Overview:  continue gabapentin and meloxicam,  . Bimalleolar ankle fracture 02/21/2014  . Bronchitis 07/07/2016  . Carpal tunnel syndrome   . CHF (congestive heart failure) ([place])    76-1950  . Chronic anticoagulation 12/03/2014  . Chronic diastolic heart failure ([place]) 12/03/2014  . CKD (chronic kidney disease)  stage 3, GFR 30-59 ml/min (HCC) 11/21/2014  . COPD (chronic obstructive pulmonary disease) ([place]) 07/07/2016  . DM2 (diabetes mellitus, type 2) ([place])   . Edema, peripheral 07/07/2016   Overview:  increase lasix to 40 mg bid  . Enthesopathy 07/07/2016  . Esophagitis, reflux 07/07/2016  . Essential hypertension 05/06/2015  . Functional diarrhea 07/07/2016  . GERD (gastroesophageal reflux disease)   . Headache 07/07/2016  . Heart  failure ([place])   . History of diverticulosis 07/07/2016  . HLD (hyperlipidemia)   . HTN (hypertension)   . Hyperlipidemia 05/06/2015  . Hypertensive heart disease with heart failure ([place]) 12/03/2014  . Hypothyroid 07/07/2016  . Hypothyroidism   . Irritable bowel syndrome 07/07/2016   Overview:  IBS information sheet provided,  dietary changes suggested  . Low back pain 07/07/2016   Overview:  l[facility]bar spine films at [facility]  . Major depressive disorder, recurrent, moderate ([place]) 07/07/2016  . Microalbuminuria due to type 2 diabetes mellitus ([place]) 05/06/2015  . Midline low back pain without sciatica 07/07/2016   Overview:  --RTC for warning signs (Fevers, chills, loss of bowel/bladder function, saddle numbness), worsening pain or failure for pain to improve with symptomatic treatment --reviewed natural course of disease that acute LBP takes 4-6 weeks to recover --cont meds listed for symptomatic relief --follow up in 2-4 weeks or earlier if warning signs develop  . Nerve pain 07/07/2016  . Obesity   . Obesity (BMI 30-39.9) 05/06/2015  . OSA (obstructive sleep apnea) 07/07/2016  . Osteoarthritis 07/07/2016  . Other ventral hernia 07/07/2016  . Overweight 07/07/2016  . Pacemaker 11/21/2014  . PAF (paroxysmal atrial fibrillation) ([place])   . Presence of permanent cardiac pacemaker    09-24-13 01-21-14 Last interrogation in Epic at [place]  . Rhinosinusitis 07/07/2016  . Seasonal allergies 07/07/2016  . Sleep apnea    No cpap used  . Smoker 07/07/2016  . SSS (sick sinus syndrome) ([place]) 11/21/2014  . Symptomatic menopausal or female climacteric states 07/07/2016  . Thoracic back pain 07/07/2016  . Type II or unspecified type diabetes mellitus with renal manifestations, uncontrolled 07/07/2016    Past Surgical History:  Procedure Laterality Date  . CARPAL TUNNEL RELEASE    . CHOLECYSTECTOMY    . ORIF ANKLE FRACTURE Left 02/21/2014   Procedure: OPEN REDUCTION INTERNAL FIXATION (ORIF) LEFT  ANKLE  FRACTURE;  Surgeon: Tobi Bastos, MD;  Location: [facility];  Service: Orthopedics;  Laterality: Left;  . PACEMAKER INSERTION  09-24-13  . TOTAL HIP ARTHROPLASTY    . TUBAL LIGATION      Current Medications:    Allergies:   Meperidine and Hydrocodone-acetaminophen   Social History   Social History  . Marital status: Single    Spouse name: N/A  . Number of children: 2  . Years of education: N/A   Occupational History  . [employer]    Social History Main Topics  . Smoking status: Former Smoker    Years: 2.00    Quit date: 11/25/2008  . Smokeless tobacco: Never Used  . Alcohol use No  . Drug use: No  . Sexual activity: Not Asked   Other Topics Concern  . None   Social History Narrative  . None     Family History: The patient's family history includes Heart failure in her unknown relative. ROS:   Please see the history of present illness.    All other systems reviewed and are negative.  EKGs/Labs/Other Studies Reviewed:    The following  studies were reviewed today:  REMOTE MONITORING ASSESSMENT  Date of Transmission: September 07, 2016 Patient Name: Carolyn [name], [birth year], 66 y.o. Manufacturer of Device: Medtronic Type of Device: Dual Chamber Pacemaker Presenting Rhythm: AP VP @ 85bpm Percentage RV Pacing: <1% RV Paced Percentage Biventricular Pacing: Not Applicable Device Findings:  No Significant Atrial or Ventricular Arrhythmias  Battery Status Adequate Battery Voltage  Lead Trends Lead Trends Stable   Recent Labs: No results found for requested labs within last 8760 hours.  Recent Lipid Panel No results found for: CHOL, TRIG, HDL, CHOLHDL, VLDL, LDLCALC, LDLDIRECT  Physical Exam:    VS:  BP 106/74 (BP Location: Right Arm, Patient Position: Sitting, Cuff Size: Normal)   Pulse 77   Ht 5\' 4"  (1.626 m)   Wt 218 lb (98.9 kg)   LMP 02/26/2005   SpO2 97%   BMI 37.42 kg/m     Wt Readings from Last 3 Encounters:  11/22/16 218 lb  (98.9 kg)  02/21/14 237 lb (107.5 kg)     GEN:  Well nourished, well developed in no acute distress HEENT: Normal NECK: No JVD; No carotid bruits LYMPHATICS: No lymphadenopathy CARDIAC: RRR, no murmurs, rubs, gallops RESPIRATORY:  Clear to auscultation without rales, wheezing or rhonchi  ABDOMEN: Soft, non-tender, non-distended MUSCULOSKELETAL:  No edema; No deformity  SKIN: Warm and dry NEUROLOGIC:  Alert and oriented x 3 PSYCHIATRIC:  Normal affect    Signed, Shirlee More, MD  11/22/2016 12:39 PM    [facility]

## 2016-11-23 LAB — COMPREHENSIVE METABOLIC PANEL
ALBUMIN: 4.2 g/dL (ref 3.6–4.8)
ALK PHOS: 102 IU/L (ref 39–117)
ALT: 30 IU/L (ref 0–32)
AST: 18 IU/L (ref 0–40)
Albumin/Globulin Ratio: 1.9 (ref 1.2–2.2)
BUN/Creatinine Ratio: 12 (ref 12–28)
BUN: 12 mg/dL (ref 8–27)
Bilirubin Total: 0.9 mg/dL (ref 0.0–1.2)
CO2: 24 mmol/L (ref 20–29)
CREATININE: 1.01 mg/dL — AB (ref 0.57–1.00)
Calcium: 8.9 mg/dL (ref 8.7–10.3)
Chloride: 106 mmol/L (ref 96–106)
GFR calc Af Amer: 68 mL/min/{1.73_m2} (ref >59)
GFR calc non Af Amer: 59 mL/min/{1.73_m2} — ABNORMAL LOW (ref >59)
Globulin, Total: 2.2 g/dL (ref 1.5–4.5)
Glucose: 100 mg/dL — ABNORMAL HIGH (ref 65–99)
Potassium: 4.9 mmol/L (ref 3.5–5.2)
SODIUM: 144 mmol/L (ref 134–144)
Total Protein: 6.4 g/dL (ref 6.0–8.5)

## 2016-11-23 LAB — LIPID PANEL
Chol/HDL Ratio: 4.2 ratio (ref 0.0–4.4)
Cholesterol, Total: 187 mg/dL (ref 100–199)
HDL: 45 mg/dL (ref >39)
LDL CALC: 86 mg/dL (ref 0–99)
Triglycerides: 278 mg/dL — ABNORMAL HIGH (ref 0–149)
VLDL Cholesterol Cal: 56 mg/dL — ABNORMAL HIGH (ref 5–40)

## 2017-01-13 ENCOUNTER — Telehealth: Payer: Self-pay | Admitting: Cardiology

## 2017-01-13 NOTE — Telephone Encounter (Signed)
Patient states that she is due for device check and Dr. Bettina Gavia told her that we could do it in the [facility] office and it is not open yet.Marland Kitchen Please call patient.

## 2017-01-14 NOTE — Telephone Encounter (Signed)
Patient advised message was sent to scheduler for EP team and she is on the list to call for [place] device clinic once open. Patient verbalized understanding.

## 2017-01-14 NOTE — Telephone Encounter (Signed)
Left message to return call 

## 2017-04-20 ENCOUNTER — Telehealth: Payer: Self-pay | Admitting: Cardiology

## 2017-04-20 NOTE — Telephone Encounter (Signed)
Patient states her pacemaker has not been checked in six months.

## 2017-04-20 NOTE — Telephone Encounter (Signed)
Patient advised that the [facility] office has not opened yet. Patient agrees to be checked in [facility] one time. Will send message to EP scheduling team. Patient verbalized understanding, no further questions.

## 2017-05-07 DIAGNOSIS — M79642 Pain in left hand: Secondary | ICD-10-CM | POA: Insufficient documentation

## 2017-05-07 HISTORY — DX: Pain in left hand: M79.642

## 2017-05-10 DIAGNOSIS — R928 Other abnormal and inconclusive findings on diagnostic imaging of breast: Secondary | ICD-10-CM | POA: Insufficient documentation

## 2017-05-10 HISTORY — DX: Other abnormal and inconclusive findings on diagnostic imaging of breast: R92.8

## 2017-05-18 ENCOUNTER — Encounter: Payer: Self-pay | Admitting: Cardiology

## 2017-05-18 ENCOUNTER — Ambulatory Visit (INDEPENDENT_AMBULATORY_CARE_PROVIDER_SITE_OTHER): Payer: BLUE CROSS/BLUE SHIELD | Admitting: Cardiology

## 2017-05-18 VITALS — BP 135/84 | HR 73 | Ht 63.5 in | Wt 232.0 lb

## 2017-05-18 DIAGNOSIS — I480 Paroxysmal atrial fibrillation: Secondary | ICD-10-CM

## 2017-05-18 DIAGNOSIS — I10 Essential (primary) hypertension: Secondary | ICD-10-CM | POA: Diagnosis not present

## 2017-05-18 DIAGNOSIS — I495 Sick sinus syndrome: Secondary | ICD-10-CM | POA: Diagnosis not present

## 2017-05-18 DIAGNOSIS — I5032 Chronic diastolic (congestive) heart failure: Secondary | ICD-10-CM | POA: Diagnosis not present

## 2017-05-18 DIAGNOSIS — E785 Hyperlipidemia, unspecified: Secondary | ICD-10-CM

## 2017-05-18 MED ORDER — METOPROLOL TARTRATE 50 MG PO TABS: 50.0000 mg | ORAL_TABLET | Freq: Two times a day (BID) | ORAL | 3 refills | Status: DC

## 2017-05-18 NOTE — Patient Instructions (Addendum)
Medication Instructions:  Your physician has recommended you make the following change in your medication:  1. INCREASE Metoprolol to 50 mg twice daily  *If you need a refill on your cardiac medications before your next appointment, please call your pharmacy*  Labwork: None ordered  Testing/Procedures: None ordered  Follow-Up: Remote monitoring is used to monitor your Pacemaker or ICD from home. This monitoring reduces the number of office visits required to check your device to one time per year. It allows [place] to keep an eye on the functioning of your device to ensure it is working properly. You are scheduled for a device check from home on 08/17/2017. You may send your transmission at any time that day. If you have a wireless device, the transmission will be sent automatically. After your physician reviews your transmission, you will receive a postcard with your next transmission date.  Your physician wants you to follow-up in: 1 year with Dr. Curt Bears.  You will receive a reminder letter in the mail two months in advance. If you don't receive a letter, please call our office to schedule the follow-up appointment.  Thank you for choosing [facility]!!   Trinidad Curet, RN [phone]

## 2017-05-18 NOTE — Progress Notes (Signed)
Electrophysiology Office Note   Date:  05/18/2017   ID:  Carolyn [name], DOB 1950-02-26, MRN [id number]  PCP:  Myrlene Broker, MD  Cardiologist:  Bettina Gavia Primary Electrophysiologist:  Blakely Gluth Meredith Leeds, MD    Chief Complaint  Patient presents with  . Pacemaker Check    Siick sinus syndrome/PAF     History of Present Illness: Carolyn [name] is a 67 y.o. female who is being seen today for the evaluation of sick sinus syndrome at the request of Shirlee More. Presenting today for electrophysiology evaluation.  She has a history of paroxysmal atrial fibrillation, hypertension, diastolic heart failure, sick sinus syndrome status post Medtronic pacemaker, and hyperlipidemia.  She had cryoablation for atrial fibrillation December 2017.  She had been on Multaq but this was stopped July 2018.  Today, she denies symptoms of chest pain, shortness of breath, orthopnea, PND, lower extremity edema, claudication, dizziness, presyncope, syncope, bleeding, or neurologic sequela. The patient is tolerating medications without difficulties.  She does have occasional palpitations.  She has had some atrial fibrillation and atrial tachycardia.  All have been short-lived.   Past Medical History:  Diagnosis Date  . Acquired hypothyroidism 07/07/2016  . Acute gastritis 07/07/2016  . Allergic rhinitis 07/07/2016  . Anemia 07/07/2016  . Anxiety 07/07/2016  . Arthritis    Osteoarthritis  . ATRIAL FIBRILLATION 11/27/2008   Qualifier: Diagnosis of  By: Genevie Cheshire PharmD, Gay Filler    . Atypical chest pain 07/07/2016  . Benign essential hypertension 07/07/2016   Overview:  stable on current meds  . Benign paroxysmal positional vertigo 07/07/2016   Overview:  Avoid rapid headmovement and position change. May taper the gabapentin to see if helps.  . Bilateral low back pain without sciatica 07/07/2016   Overview:  continue gabapentin and meloxicam,  . Bimalleolar ankle fracture 02/21/2014  . Bronchitis 07/07/2016  .  Carpal tunnel syndrome   . CHF (congestive heart failure) ([place])    [id number]  . Chronic anticoagulation 12/03/2014  . Chronic diastolic heart failure ([place]) 12/03/2014  . CKD (chronic kidney disease) stage 3, GFR 30-59 ml/min (HCC) 11/21/2014  . COPD (chronic obstructive pulmonary disease) ([place]) 07/07/2016  . DM2 (diabetes mellitus, type 2) ([place])   . Edema, peripheral 07/07/2016   Overview:  increase lasix to 40 mg bid  . Enthesopathy 07/07/2016  . Esophagitis, reflux 07/07/2016  . Essential hypertension 05/06/2015  . Functional diarrhea 07/07/2016  . GERD (gastroesophageal reflux disease)   . Headache 07/07/2016  . Heart failure ([place])   . History of diverticulosis 07/07/2016  . HLD (hyperlipidemia)   . HTN (hypertension)   . Hyperlipidemia 05/06/2015  . Hypertensive heart disease with heart failure ([place]) 12/03/2014  . Hypothyroid 07/07/2016  . Hypothyroidism   . Irritable bowel syndrome 07/07/2016   Overview:  IBS information sheet provided,  dietary changes suggested  . Low back pain 07/07/2016   Overview:  [facility]  . Major depressive disorder, recurrent, moderate ([place]) 07/07/2016  . Microalbuminuria due to type 2 diabetes mellitus ([place]) 05/06/2015  . Midline low back pain without sciatica 07/07/2016   Overview:  --RTC for warning signs (Fevers, chills, loss of bowel/bladder function, saddle numbness), worsening pain or failure for pain to improve with symptomatic treatment --reviewed natural course of disease that acute LBP takes 4-6 weeks to recover --cont meds listed for symptomatic relief --follow up in 2-4 weeks or earlier if warning signs develop  . Nerve pain 07/07/2016  . Obesity   . Obesity (  BMI 30-39.9) 05/06/2015  . OSA (obstructive sleep apnea) 07/07/2016  . Osteoarthritis 07/07/2016  . Other ventral hernia 07/07/2016  . Overweight 07/07/2016  . Pacemaker 11/21/2014  . PAF (paroxysmal atrial fibrillation) ([place])   . Presence of permanent cardiac pacemaker    09-24-13  01-21-14 Last interrogation in Epic at [place]  . Rhinosinusitis 07/07/2016  . Seasonal allergies 07/07/2016  . Sleep apnea    No cpap used  . Smoker 07/07/2016  . SSS (sick sinus syndrome) ([place]) 11/21/2014  . Symptomatic menopausal or female climacteric states 07/07/2016  . Thoracic back pain 07/07/2016  . Type II or unspecified type diabetes mellitus with renal manifestations, uncontrolled 07/07/2016   Past Surgical History:  Procedure Laterality Date  . CARPAL TUNNEL RELEASE    . CHOLECYSTECTOMY    . ORIF ANKLE FRACTURE Left 02/21/2014   Procedure: OPEN REDUCTION INTERNAL FIXATION (ORIF) LEFT  ANKLE FRACTURE;  Surgeon: Tobi Bastos, MD;  Location: [facility];  Service: Orthopedics;  Laterality: Left;  . PACEMAKER INSERTION  09-24-13  . TOTAL HIP ARTHROPLASTY    . TUBAL LIGATION       Current Outpatient Medications  Medication Sig Dispense Refill  . apixaban (ELIQUIS) 5 MG TABS tablet Take 1 tablet (5 mg total) by mouth 2 (two) times daily. 180 tablet 3  . atorvastatin (LIPITOR) 20 MG tablet Take 1 tablet (20 mg total) by mouth daily. 90 tablet 3  . benazepril (LOTENSIN) 10 MG tablet Take 10 mg by mouth daily.      Marland Kitchen escitalopram (LEXAPRO) 20 MG tablet Take 20 mg by mouth daily.      . furosemide (LASIX) 20 MG tablet Take 20 mg by mouth as needed for fluid.    . Insulin Detemir (LEVEMIR FLEXTOUCH) 100 UNIT/ML Pen Inject 40 Units into the skin daily.    Marland Kitchen levothyroxine (SYNTHROID, LEVOTHROID) 150 MCG tablet Take 150 mcg by mouth daily.      Marland Kitchen liraglutide (VICTOZA) 18 MG/3ML SOPN INJECT 1.8MG  SUBCUTANEOUSLY EVERY MORNING    . pantoprazole (PROTONIX) 40 MG tablet Take 40 mg by mouth daily.     No current facility-administered medications for this visit.     Allergies:   Meperidine and Hydrocodone-acetaminophen   Social History:  The patient  reports that she quit smoking about 8 years ago. She quit after 2.00 years of use. She has never used smokeless tobacco. She reports  that she does not drink alcohol or use drugs.   Family History:  The patient's family history includes Heart failure in her unknown relative.    ROS:  Please see the history of present illness.   Otherwise, review of systems is positive for none.   All other systems are reviewed and negative.    PHYSICAL EXAM: VS:  BP 135/84   Pulse 73   Ht 5' 3.5" (1.613 m)   Wt 232 lb (105.2 kg)   LMP 02/26/2005   SpO2 96%   BMI 40.45 kg/m  , BMI Body mass index is 40.45 kg/m. GEN: Well nourished, well developed, in no acute distress  HEENT: normal  Neck: no JVD, carotid bruits, or masses Cardiac: RRR; no murmurs, rubs, or gallops,no edema  Respiratory:  clear to auscultation bilaterally, normal work of breathing GI: soft, nontender, nondistended, + BS MS: no deformity or atrophy  Skin: warm and dry,  device pocket is well healed Neuro:  Strength and sensation are intact Psych: euthymic mood, full affect  EKG:  EKG is not ordered  today. Personal review of the ekg ordered 11/22/16 shows SR, rate 69  Device interrogation is reviewed today in detail.  See PaceArt for details.   Recent Labs: 11/22/2016: ALT 30; BUN 12; Creatinine, Ser 1.01; Potassium 4.9; Sodium 144    Lipid Panel     Component Value Date/Time   CHOL 187 11/22/2016 1041   TRIG 278 (H) 11/22/2016 1041   HDL 45 11/22/2016 1041   CHOLHDL 4.2 11/22/2016 1041   LDLCALC 86 11/22/2016 1041     Wt Readings from Last 3 Encounters:  05/18/17 232 lb (105.2 kg)  11/22/16 218 lb (98.9 kg)  02/21/14 237 lb (107.5 kg)      Other studies Reviewed: Additional studies/ records that were reviewed today include: TTE 2017  Review of the above records today demonstrates:  Normal left ventricular size and systolic function with no appreciable segmental abnormality. EF 55% There was no evidence of spontaneous echo contrast or thrombus in the left atrium or left atrial appendage. No evidence of patent foramen ovale by doppler  evaluation .   ASSESSMENT AND PLAN:  1.  Paroxysmal atrial fibrillation: Currently on no antiarrhythmics.  Eliquis for anticoagulation.  Have occasional palpitations and has had some breakthrough both atrial fibrillation and atrial tachycardia.  Loren Sawaya increase metoprolol to 50 mg.  This patients CHA2DS2-VASc Score and unadjusted Ischemic Stroke Rate (% per year) is equal to 4.8 % stroke rate/year from a score of 4  Above score calculated as 1 point each if present [CHF, HTN, DM, Vascular=MI/PAD/Aortic Plaque, Age if 65-74, or Female] Above score calculated as 2 points each if present [Age > 75, or Stroke/TIA/TE]  2.  Sick sinus syndrome: Status post  Medtronic pacemaker.  Device functioning appropriately.  No changes.  3.  Hypertensive heart disease with diastolic heart failure: Blood pressure well controlled.  No changes.  4.  Diastolic heart failure: No signs of volume overload  5.  Hyperlipidemia: Continue statin  Current medicines are reviewed at length with the patient today.   The patient does not have concerns regarding her medicines.  The following changes were made today:  none  Labs/ tests ordered today include:  No orders of the defined types were placed in this encounter.    Disposition:   FU with Zanyia Silbaugh 1 year  Signed, Brigitt Mcclish Meredith Leeds, MD  05/18/2017 4:15 PM     [place] [address] [phone] (office) [phone] (fax)

## 2017-05-19 LAB — CUP PACEART INCLINIC DEVICE CHECK
Date Time Interrogation Session: 20190425133459
Implantable Lead Implant Date: 20150831
Implantable Lead Implant Date: 20150831
Implantable Lead Location: 753859
Implantable Lead Location: 753860
Implantable Lead Model: 4092
Implantable Lead Model: 5076
Implantable Pulse Generator Implant Date: 20150831

## 2017-06-06 ENCOUNTER — Other Ambulatory Visit: Payer: Self-pay | Admitting: Family Medicine

## 2017-06-06 DIAGNOSIS — R928 Other abnormal and inconclusive findings on diagnostic imaging of breast: Secondary | ICD-10-CM

## 2017-06-14 ENCOUNTER — Ambulatory Visit
Admission: RE | Admit: 2017-06-14 | Discharge: 2017-06-14 | Disposition: A | Discharge status: Home / Self Care | Payer: BLUE CROSS/BLUE SHIELD | Source: Ambulatory Visit | Attending: Family Medicine | Admitting: Family Medicine

## 2017-06-14 DIAGNOSIS — R928 Other abnormal and inconclusive findings on diagnostic imaging of breast: Secondary | ICD-10-CM

## 2017-06-21 ENCOUNTER — Ambulatory Visit: Payer: Self-pay | Admitting: Surgery

## 2017-06-21 DIAGNOSIS — D241 Benign neoplasm of right breast: Secondary | ICD-10-CM

## 2017-06-21 NOTE — H&P (Signed)
[name] Documented: 06/21/2017 1:40 PM Location: [facility] Patient #: [id number] DOB: February 28, 1950 Divorced / Language: Carolyn [name] / Race: White Female  History of Present Illness [name] A. Peg [name] MD; 06/21/2017 2:06 PM) Patient words: Patient is set at the request of Dr. Jimmye [name] due to an abnormal mammogram. The patient went for screening mammogram was found to have an area of distortion in the right breast upper inner quadrant. Core biopsy was done which showed papilloma. Patient denies any history of breast mass, breast pain, nipple discharge or the problem with either breast. There is no strong family history of breast cancer.        ADDENDUM REPORT: 06/16/2017 07:53 ADDENDUM: Pathology revealed INTRADUCTAL PAPILLOMA, FIBROCYSTIC CHANGE of RIGHT breast, upper inner. This was found to be concordant by Dr. Dorise [name], with excision recommended. Pathology results were discussed with the patient by telephone. The patient reported doing well after the biopsy with tenderness at the site. Post biopsy instructions and care were reviewed and questions were answered. The patient was encouraged to call [facility] for any additional concerns. Surgical consultation has been arranged with Dr. Erroll Luna at [facility] on Jun 21, 2017. Pathology results reported by Carolyn Messier, RN on 06/16/2017. Electronically Signed By: Carolyn [name] III M.D On: 06/16/2017 07:53 Addended by Carolyn Ram, MD on 06/16/2017 10:25 AM  Study Result CLINICAL DATA: Right breast mass EXAM: RIGHT BREAST STEREOTACTIC CORE NEEDLE BIOPSY COMPARISON: Previous exams. FINDINGS: The patient and I discussed the procedure of stereotactic-guided biopsy including benefits and alternatives. We discussed the high likelihood of a successful procedure. We discussed the risks of the procedure including infection, bleeding, tissue injury, clip  migration, and inadequate sampling. Informed written consent was given. The usual time out protocol was performed immediately prior to the procedure. Using sterile technique and 1% Lidocaine as local anesthetic, under stereotactic guidance, a 9 gauge vacuum assisted device was used to perform core needle biopsy of a mass in the medial superior right breast using a superior approach. Lesion quadrant: Upper inner At the conclusion of the procedure, a tissue marker clip was deployed into the biopsy cavity. Follow-up 2-view mammogram was performed and dictated separately. IMPRESSION: Stereotactic-guided biopsy of a mass in the upper inner right breast. No apparent complications. Electronically Signed: By: Carolyn [name] III M.D On: 06/14/2017 09:59          Diagnosis Breast, right, needle core biopsy, upper inner - INTRADUCTAL PAPILLOMA. - FIBROCYSTIC CHANGE. - NO MALIGNANCY IDENTIFIED. Microscopic Comment The case was called to [facility] on 06/15/2017.  The patient is a 67 year old female.   Past Surgical History Carolyn [name]; 06/21/2017 1:40 PM) Breast Biopsy Bilateral. Colon Polyp Removal - Colonoscopy Foot Surgery Left. Gallbladder Surgery - Laparoscopic Hip Surgery Left.  Diagnostic Studies History Carolyn [name]; 06/21/2017 1:40 PM) Colonoscopy 1-5 years ago Mammogram within last year Pap Smear 1-5 years ago  Allergies Carolyn [name]; 06/21/2017 1:41 PM) No Known Drug Allergies [06/21/2017]: Allergies Reconciled  Medication History Carolyn [name]; 06/21/2017 1:44 PM) Metoprolol Tartrate (50MG  Tablet, Oral) Active. Pantoprazole Sodium (40MG  Tablet DR, Oral) Active. Levothyroxine Sodium (150MCG Tablet, Oral) Active. Levemir FlexTouch (100UNIT/ML Soln Pen-inj, Subcutaneous) Active. Escitalopram Oxalate (20MG  Tablet, Oral) Active. Benazepril HCl (10MG  Tablet, Oral) Active. Atorvastatin Calcium (20MG  Tablet, Oral)  Active. Benzonatate (100MG  Capsule, Oral) Active. Medications Reconciled  Social History Carolyn [name]; 06/21/2017 1:40 PM) Caffeine use Carbonated beverages, Coffee, Tea. No alcohol use No drug use  Tobacco use Former smoker.  Family History Carolyn [name]; 06/21/2017 1:40 PM) Arthritis Brother, Father, Mother, Sister. Diabetes Mellitus Brother, Father, Sister, Son. Hypertension Brother, Father, Mother, Sister, Son. Kidney Disease Brother. Migraine Headache Son.  Pregnancy / Birth History Carolyn [name]; 06/21/2017 1:40 PM) Age at menarche 44 years. Age of menopause 33-55 Gravida 2 Maternal age 40-20 Para 2  Other Problems Carolyn [name]; 06/21/2017 1:40 PM) Arthritis Atrial Fibrillation Chest pain Cholelithiasis Congestive Heart Failure Diabetes Mellitus Gastroesophageal Reflux Disease Heart murmur Hypercholesterolemia Thyroid Disease     Review of Systems (Carolyn [name] A. Carolyn Kist MD; 06/21/2017 2:06 PM) General Not Present- Appetite Loss, Chills, Fatigue, Fever, Night Sweats, Weight Gain and Weight Loss. Skin Present- Dryness. Not Present- Change in Wart/Mole, Hives, Jaundice, New Lesions, Non-Healing Wounds, Rash and Ulcer. HEENT Present- Hoarseness, Seasonal Allergies and Wears glasses/contact lenses. Not Present- Earache, Hearing Loss, Nose Bleed, Oral Ulcers, Ringing in the Ears, Sinus Pain, Sore Throat, Visual Disturbances and Yellow Eyes. Cardiovascular Present- Chest Pain. Not Present- Difficulty Breathing Lying Down, Leg Cramps, Palpitations, Rapid Heart Rate, Shortness of Breath and Swelling of Extremities. Gastrointestinal Not Present- Abdominal Pain, Bloating, Bloody Stool, Change in Bowel Habits, Chronic diarrhea, Constipation, Difficulty Swallowing, Excessive gas, Gets full quickly at meals, Hemorrhoids, Indigestion, Nausea, Rectal Pain and Vomiting. Female Genitourinary Not Present- Frequency, Nocturia, Painful Urination, Pelvic Pain and  Urgency. Musculoskeletal Present- Back Pain. Not Present- Joint Pain, Joint Stiffness, Muscle Pain, Muscle Weakness and Swelling of Extremities. Neurological Not Present- Decreased Memory, Fainting, Headaches, Numbness, Seizures, Tingling, Tremor, Trouble walking and Weakness. Psychiatric Not Present- Anxiety, Bipolar, Change in Sleep Pattern, Depression, Fearful and Frequent crying. Endocrine Present- Heat Intolerance. Not Present- Cold Intolerance, Excessive Hunger, Hair Changes, Hot flashes and New Diabetes. Hematology Present- Blood Thinners. Not Present- Easy Bruising, Excessive bleeding, Gland problems, HIV and Persistent Infections. All other systems negative  Vitals Carolyn [name]; 06/21/2017 1:45 PM) 06/21/2017 1:44 PM Weight: 233.5 lb Height: 63in Body Surface Area: 2.07 m Body Mass Index: 41.36 kg/m  Temp.: 98.19F(Oral)  Pulse: 94 (Regular)  BP: 134/82 (Sitting, Left Arm, Standard)      Physical Exam (Isla Sabree A. Jaxxen Voong MD; 06/21/2017 2:06 PM)  General Mental Status-Alert. General Appearance-Consistent with stated age. Hydration-Well hydrated. Voice-Normal.  Head and Neck Head-normocephalic, atraumatic with no lesions or palpable masses. Trachea-midline. Thyroid Gland Characteristics - normal size and consistency.  Chest and Lung Exam Chest and lung exam reveals -quiet, even and easy respiratory effort with no use of accessory muscles and on auscultation, normal breath sounds, no adventitious sounds and normal vocal resonance. Inspection Chest Wall - Normal. Back - normal.  Breast Breast - Left-Symmetric, Non Tender, No Biopsy scars, no Dimpling, No Inflammation, No Lumpectomy scars, No Mastectomy scars, No Peau d' Orange. Breast - Right-Symmetric, Non Tender, No Biopsy scars, no Dimpling, No Inflammation, No Lumpectomy scars, No Mastectomy scars, No Peau d' Orange. Breast Lump-No Palpable Breast Mass.  Neurologic Neurologic  evaluation reveals -alert and oriented x 3 with no impairment of recent or remote memory. Mental Status-Normal.  Musculoskeletal Normal Exam - Left-Upper Extremity Strength Normal and Lower Extremity Strength Normal. Normal Exam - Right-Upper Extremity Strength Normal and Lower Extremity Strength Normal.  Lymphatic Head & Neck  General Head & Neck Lymphatics: Bilateral - Description - Normal. Axillary  General Axillary Region: Bilateral - Description - Normal. Tenderness - Non Tender.    Assessment & Plan (Lexee Brashears A. Myra Weng MD; 06/21/2017 2:04 PM)  PAPILLOMA OF RIGHT BREAST (D24.1) Impression: recommend lumpectomy on the right 1 in  5 lesions are upgraded to a malignant lesion needs cardiac clearance Risk of lumpectomy include bleeding, infection, seroma, more surgery, use of seed/wire, wound care, cosmetic deformity and the need for other treatments, death , blood clots, death. Pt agrees to proceed.  Current Plans Pt Education - [facility] Breast Biopsy HCI: discussed with patient and provided information. The anatomy and the physiology was discussed. The pathophysiology and natural history of the disease was discussed. Options were discussed and recommendations were made. Technique, risks, benefits, & alternatives were discussed. Risks such as stroke, heart attack, bleeding, indection, death, and other risks discussed. Questions answered. The patient agrees to proceed.

## 2017-06-28 ENCOUNTER — Telehealth: Payer: Self-pay

## 2017-06-28 NOTE — Telephone Encounter (Signed)
Spoke with [facility] regarding surgical request of turning pacemaker off during surgery, nurse is going to speak with surgeon to get more information and call back. Direct number to device clinic given.

## 2017-07-06 ENCOUNTER — Other Ambulatory Visit: Payer: Self-pay | Admitting: Surgery

## 2017-07-06 DIAGNOSIS — D241 Benign neoplasm of right breast: Secondary | ICD-10-CM

## 2017-07-25 NOTE — Pre-Procedure Instructions (Signed)
Carolyn [name] [facility]  07/25/2017      [id number] [address], [place] [address] Phone: [phone] Fax: [phone]    Your procedure is scheduled on August 02, 2017.  Report to [facility] Admitting at 730 AM.  Call this number if you have problems the morning of surgery:  [phone]   Remember:  Do not eat or drink after midnight, except for Pre-surgery Ensure drink.  Please complete your PRE-SURGERY ENSURE that was given to by 630 AM the morning of surgery.  Please, if able, drink it in one setting. DO NOT SIP.    Take these medicines the morning of surgery with A SIP OF WATER  Albuterol inhaler -if needed (bring inhaler with you) Metoprolol tartrate (lopressor) escitalopram (lexapro) Levothyroxine (synthroid) Pantoprazole (protonix) symbicort inhaler  Follow your surgeon's instructions on when to hold/resume Eliquis  7 days prior to surgery STOP taking any Aspirin (unless otherwise instructed by your surgeon), Aleve, Naproxen, Ibuprofen, Motrin, Advil, Goody's, BC's, all herbal medications, fish oil, and all vitamins  WHAT DO I DO ABOUT MY DIABETES MEDICATION?   Marland Kitchen Do not take oral diabetes medicines (pills) the morning of surgery.  . THE NIGHT BEFORE SURGERY, take 20 units of levimir insulin (1/2 of your normal dose).      . The day of surgery, do not take other diabetes injectables, including Byetta (exenatide), Bydureon (exenatide ER), Victoza (liraglutide), or Trulicity (dulaglutide).  . If your CBG is greater than 220 mg/dL, you may take  of your sliding scale (correction) dose of insulin.   Reviewed and Endorsed by [facility] Patient Education Committee, August 2015  How to Manage Your Diabetes Before and After Surgery  Why is it important to control my blood sugar before and after surgery? . Improving blood sugar levels before and after surgery helps healing and can limit problems. . A  way of improving blood sugar control is eating a healthy diet by: o  Eating less sugar and carbohydrates o  Increasing activity/exercise o  Talking with your doctor about reaching your blood sugar goals . High blood sugars (greater than 180 mg/dL) can raise your risk of infections and slow your recovery, so you will need to focus on controlling your diabetes during the weeks before surgery. . Make sure that the doctor who takes care of your diabetes knows about your planned surgery including the date and location.  How do I manage my blood sugar before surgery? . Check your blood sugar at least 4 times a day, starting 2 days before surgery, to make sure that the level is not too high or low. o Check your blood sugar the morning of your surgery when you wake up and every 2 hours until you get to the Short Stay unit. . If your blood sugar is less than 70 mg/dL, you will need to treat for low blood sugar: o Do not take insulin. o Treat a low blood sugar (less than 70 mg/dL) with  cup of clear juice (cranberry or apple), 4 glucose tablets, OR glucose gel. Recheck blood sugar in 15 minutes after treatment (to make sure it is greater than 70 mg/dL). If your blood sugar is not greater than 70 mg/dL on recheck, call [phone] o  for further instructions. . Report your blood sugar to the short stay nurse when you get to Short Stay.  . If you are admitted to the hospital after surgery:  o Your blood sugar will be checked by the staff and you will probably be given insulin after surgery (instead of oral diabetes medicines) to make sure you have good blood sugar levels. o The goal for blood sugar control after surgery is 80-180 mg/dL    Do not wear jewelry, make-up or nail polish.  Do not wear lotions, powders, or perfumes, or deodorant.  Do not shave 48 hours prior to surgery.    Do not bring valuables to the hospital.  [facility] is not responsible for any belongings or valuables.  Contacts,  dentures or bridgework may not be worn into surgery.  Leave your suitcase in the car.  After surgery it may be brought to your room.  For patients admitted to the hospital, discharge time will be determined by your treatment team.  Patients discharged the day of surgery will not be allowed to drive home.    [facility]- Preparing For Surgery  Before surgery, you can play an important role. Because skin is not sterile, your skin needs to be as free of germs as possible. You can reduce the number of germs on your skin by washing with CHG (chlorahexidine gluconate) Soap before surgery.  CHG is an antiseptic cleaner which kills germs and bonds with the skin to continue killing germs even after washing.    Oral Hygiene is also important to reduce your risk of infection.  Remember - BRUSH YOUR TEETH THE MORNING OF SURGERY WITH YOUR REGULAR TOOTHPASTE  Please do not use if you have an allergy to CHG or antibacterial soaps. If your skin becomes reddened/irritated stop using the CHG.  Do not shave (including legs and underarms) for at least 48 hours prior to first CHG shower. It is OK to shave your face.  Please follow these instructions carefully.   1. Shower the NIGHT BEFORE SURGERY and the MORNING OF SURGERY with CHG.   2. If you chose to wash your hair, wash your hair first as usual with your normal shampoo.  3. After you shampoo, rinse your hair and body thoroughly to remove the shampoo.  4. Use CHG as you would any other liquid soap. You can apply CHG directly to the skin and wash gently with a scrungie or a clean washcloth.   5. Apply the CHG Soap to your body ONLY FROM THE NECK DOWN.  Do not use on open wounds or open sores. Avoid contact with your eyes, ears, mouth and genitals (private parts). Wash Face and genitals (private parts)  with your normal soap.  6. Wash thoroughly, paying special attention to the area where your surgery will be performed.  7. Thoroughly rinse your body with  warm water from the neck down.  8. DO NOT shower/wash with your normal soap after using and rinsing off the CHG Soap.  9. Pat yourself dry with a CLEAN TOWEL.  10. Wear CLEAN PAJAMAS to bed the night before surgery, wear comfortable clothes the morning of surgery  11. Place CLEAN SHEETS on your bed the night of your first shower and DO NOT SLEEP WITH PETS.  Day of Surgery:  Do not apply any deodorants/lotions.  Please wear clean clothes to the hospital/surgery center.   Remember to brush your teeth WITH YOUR REGULAR TOOTHPASTE.  Please read over the following fact sheets that you were given. Pain Booklet, Coughing and Deep Breathing and Surgical Site Infection Prevention

## 2017-07-26 ENCOUNTER — Encounter (HOSPITAL_COMMUNITY)
Admission: RE | Admit: 2017-07-26 | Discharge: 2017-07-26 | Disposition: A | Discharge status: Home / Self Care | Payer: BLUE CROSS/BLUE SHIELD | Source: Ambulatory Visit | Attending: Surgery | Admitting: Surgery

## 2017-07-26 ENCOUNTER — Other Ambulatory Visit: Payer: Self-pay

## 2017-07-26 ENCOUNTER — Encounter (HOSPITAL_COMMUNITY): Payer: Self-pay

## 2017-07-26 DIAGNOSIS — Z7901 Long term (current) use of anticoagulants: Secondary | ICD-10-CM | POA: Diagnosis not present

## 2017-07-26 DIAGNOSIS — E1122 Type 2 diabetes mellitus with diabetic chronic kidney disease: Secondary | ICD-10-CM | POA: Insufficient documentation

## 2017-07-26 DIAGNOSIS — Z87891 Personal history of nicotine dependence: Secondary | ICD-10-CM | POA: Diagnosis not present

## 2017-07-26 DIAGNOSIS — Z01812 Encounter for preprocedural laboratory examination: Secondary | ICD-10-CM | POA: Insufficient documentation

## 2017-07-26 DIAGNOSIS — E669 Obesity, unspecified: Secondary | ICD-10-CM | POA: Insufficient documentation

## 2017-07-26 DIAGNOSIS — Z794 Long term (current) use of insulin: Secondary | ICD-10-CM | POA: Diagnosis not present

## 2017-07-26 DIAGNOSIS — N183 Chronic kidney disease, stage 3 (moderate): Secondary | ICD-10-CM | POA: Insufficient documentation

## 2017-07-26 DIAGNOSIS — Z79890 Hormone replacement therapy (postmenopausal): Secondary | ICD-10-CM | POA: Diagnosis not present

## 2017-07-26 DIAGNOSIS — I509 Heart failure, unspecified: Secondary | ICD-10-CM | POA: Insufficient documentation

## 2017-07-26 DIAGNOSIS — Z79899 Other long term (current) drug therapy: Secondary | ICD-10-CM | POA: Diagnosis not present

## 2017-07-26 DIAGNOSIS — I130 Hypertensive heart and chronic kidney disease with heart failure and stage 1 through stage 4 chronic kidney disease, or unspecified chronic kidney disease: Secondary | ICD-10-CM | POA: Insufficient documentation

## 2017-07-26 DIAGNOSIS — D241 Benign neoplasm of right breast: Secondary | ICD-10-CM | POA: Diagnosis not present

## 2017-07-26 DIAGNOSIS — Z950 Presence of cardiac pacemaker: Secondary | ICD-10-CM | POA: Diagnosis not present

## 2017-07-26 DIAGNOSIS — E039 Hypothyroidism, unspecified: Secondary | ICD-10-CM | POA: Insufficient documentation

## 2017-07-26 DIAGNOSIS — Z6841 Body Mass Index (BMI) 40.0 and over, adult: Secondary | ICD-10-CM | POA: Diagnosis not present

## 2017-07-26 DIAGNOSIS — I480 Paroxysmal atrial fibrillation: Secondary | ICD-10-CM | POA: Diagnosis not present

## 2017-07-26 DIAGNOSIS — I495 Sick sinus syndrome: Secondary | ICD-10-CM | POA: Insufficient documentation

## 2017-07-26 DIAGNOSIS — G4733 Obstructive sleep apnea (adult) (pediatric): Secondary | ICD-10-CM | POA: Diagnosis not present

## 2017-07-26 LAB — COMPREHENSIVE METABOLIC PANEL
ALK PHOS: 83 U/L (ref 38–126)
ALT: 37 U/L (ref 0–44)
ANION GAP: 9 (ref 5–15)
AST: 26 U/L (ref 15–41)
Albumin: 3.9 g/dL (ref 3.5–5.0)
BUN: 13 mg/dL (ref 8–23)
CALCIUM: 8.5 mg/dL — AB (ref 8.9–10.3)
CHLORIDE: 110 mmol/L (ref 98–111)
CO2: 19 mmol/L — AB (ref 22–32)
CREATININE: 1.07 mg/dL — AB (ref 0.44–1.00)
GFR, EST NON AFRICAN AMERICAN: 53 mL/min — AB (ref >60)
Glucose, Bld: 171 mg/dL — ABNORMAL HIGH (ref 70–99)
Potassium: 3.4 mmol/L — ABNORMAL LOW (ref 3.5–5.1)
SODIUM: 138 mmol/L (ref 135–145)
Total Bilirubin: 1.5 mg/dL — ABNORMAL HIGH (ref 0.3–1.2)
Total Protein: 6.6 g/dL (ref 6.5–8.1)

## 2017-07-26 LAB — CBC WITH DIFFERENTIAL/PLATELET
Abs Immature Granulocytes: 0 10*3/uL (ref 0.0–0.1)
BASOS ABS: 0.1 10*3/uL (ref 0.0–0.1)
BASOS PCT: 1 %
EOS ABS: 0.2 10*3/uL (ref 0.0–0.7)
Eosinophils Relative: 2 %
HCT: 42.2 % (ref 36.0–46.0)
Hemoglobin: 13.5 g/dL (ref 12.0–15.0)
Immature Granulocytes: 0 %
Lymphocytes Relative: 27 %
Lymphs Abs: 2.8 10*3/uL (ref 0.7–4.0)
MCH: 29.4 pg (ref 26.0–34.0)
MCHC: 32 g/dL (ref 30.0–36.0)
MCV: 91.9 fL (ref 78.0–100.0)
MONO ABS: 0.6 10*3/uL (ref 0.1–1.0)
MONOS PCT: 5 %
Neutro Abs: 6.9 10*3/uL (ref 1.7–7.7)
Neutrophils Relative %: 65 %
PLATELETS: 240 10*3/uL (ref 150–400)
RBC: 4.59 MIL/uL (ref 3.87–5.11)
RDW: 14.2 % (ref 11.5–15.5)
WBC: 10.6 10*3/uL — ABNORMAL HIGH (ref 4.0–10.5)

## 2017-07-26 LAB — HEMOGLOBIN A1C
HEMOGLOBIN A1C: 9.1 % — AB (ref 4.8–5.6)
MEAN PLASMA GLUCOSE: 214.47 mg/dL

## 2017-07-26 LAB — PROTIME-INR
INR: 1.13
PROTHROMBIN TIME: 14.4 s (ref 11.4–15.2)

## 2017-07-26 LAB — GLUCOSE, CAPILLARY: GLUCOSE-CAPILLARY: 176 mg/dL — AB (ref 70–99)

## 2017-07-26 NOTE — Progress Notes (Signed)
PCP: Allayne Butcher, MD  Cardiologist: Shirlee More  EKG: 11/22/2016  Stress test: 2010  ECHO: pt denies  Cardiac Cath: pt denies  Chest x-ray: pt denies past year, no recent respiratory infections/complications  Pt's last dose of Eliquis was 07/25/17 per MD instructions

## 2017-07-27 NOTE — Progress Notes (Signed)
Anesthesia Chart Review:  Case:  [id number] Date/Time:  08/02/17 0915   Procedure:  BREAST LUMPECTOMY WITH RADIOACTIVE SEED LOCALIZATION (Right Breast)   Anesthesia type:  General   Pre-op diagnosis:  RIGHT BREAST PAPILLOMA   Location:  [facility] ROOM 09 / [place] [facility]   Surgeon:  Erroll Luna, MD      DISCUSSION: Pt is a 67 yo female former smoker scheduled for above procedure. She has pertinent medical hx of paroxysmal afib (on Eliquis), IDDMII, COPD, OSA (no cpap), sick sinus syndrome with pacemaker, hypothyroid CKD III, CHF, HTN, hypothyroid.   Per Dr. Bettina Gavia okay to hold Eliquis 2d prior to procedure as requested by surgeon. Per Dr. Curt Bears okay to place magnet during procedure. See notes in pt chart.  A1c elevated on PAT labs 9.1. Results forwarded to surgeon. Anticipate surgery will proceed as planned as long as pt BS on DOS not markedly elevated.  VS: BP 140/72   Pulse 87   Temp 36.8 C   Resp 20   Ht 5\' 4"  (1.626 m)   Wt 236 lb 14.4 oz (107.5 kg)   LMP 02/26/2005   SpO2 98%   BMI 40.66 kg/m   PROVIDERS: Myrlene Broker, MD is PCP last seen 05/10/2017. PCP manages chronic diseases other than cardiovascular. Per notes pt is stable on current treatment although with apparent poor BS control.  Jerold Coombe, MD is EP last seen 05/18/2017 pt stable at that time rec'd f/u in 1 year  [place], Hilton Cork, MD is Cardiologist last seen 11/22/2016 stable at that time transferred care to EP Dr. Curt Bears   LABS: Labs reviewed: Acceptable for surgery. and A1c results routed to surgeon  (all labs ordered are listed, but only abnormal results are displayed)  Labs Reviewed  HEMOGLOBIN A1C - Abnormal; Notable for the following components:      Result Value   Hgb A1c MFr Bld 9.1 (*)    All other components within normal limits  CBC WITH DIFFERENTIAL/PLATELET - Abnormal; Notable for the following components:   WBC 10.6 (*)    All other components within normal limits  COMPREHENSIVE METABOLIC  PANEL - Abnormal; Notable for the following components:   Potassium 3.4 (*)    CO2 19 (*)    Glucose, Bld 171 (*)    Creatinine, Ser 1.07 (*)    Calcium 8.5 (*)    Total Bilirubin 1.5 (*)    GFR calc non Af Amer 53 (*)    All other components within normal limits  GLUCOSE, CAPILLARY - Abnormal; Notable for the following components:   Glucose-Capillary 176 (*)    All other components within normal limits  PROTIME-INR     IMAGES: N/A   EKG: 11/22/2016 shows NSR  CV: 48hr Holter 09/10/2016 CONCLUSION: Indication: Assess burden of atrial arrhythmia following cryo-balloon ablation of atrial fibrillation in December 2017 Results: Sinus rhythm with dual-chamber pacemaker in place predominantly pacing atrium with approximately 10% ventricular pacing. Heart rate ranges from 57 bpm up to 117 bpm. Average heart rate is 70 bpm. Rare premature ventricular contractions (1). Rare premature atrial contractions (80) with 2 atrial couplets and occasional runs of atrial tachycardia at rates of 95- 115 bpm. One episode of 116 beats of atrial tachycardia actually reflects sensor driven atrial/ventricular pacing at 116 bpm and was  interpreted as an atrial tachycardia but actually represents sensor mediated atrial pacing No atrial fibrillation seen.  ECHO 11/28/2008 1. Left ventricle: The cavity size was normal. Wall thickness was  normal. Systolic function was normal. The estimated ejection  fraction was in the range of 55% to 60%. 2. Mitral valve: Mild regurgitation. 3. Left atrium: The atrium was moderately dilated. 4. Pericardium, extracardiac: A trivial pericardial effusion was  identified.  Nuclear Stress 12/27/2008 Ischemia involving the apical portion of the anterior wall.  Normal gated imaging.  Normal ejection fraction 62%.  Past Medical History:  Diagnosis Date  . Acquired hypothyroidism 07/07/2016  . Acute gastritis 07/07/2016  . Allergic rhinitis 07/07/2016  . Anemia  07/07/2016  . Anxiety 07/07/2016  . Arthritis    Osteoarthritis  . ATRIAL FIBRILLATION 11/27/2008   Qualifier: Diagnosis of  By: Genevie Cheshire PharmD, Gay Filler    . Atypical chest pain 07/07/2016  . Benign essential hypertension 07/07/2016   Overview:  stable on current meds  . Benign paroxysmal positional vertigo 07/07/2016   Overview:  Avoid rapid headmovement and position change. May taper the gabapentin to see if helps.  . Bilateral low back pain without sciatica 07/07/2016   Overview:  continue gabapentin and meloxicam,  . Bimalleolar ankle fracture 02/21/2014  . Bronchitis 07/07/2016  . Carpal tunnel syndrome   . CHF (congestive heart failure) ([place])    [phone]  . Chronic anticoagulation 12/03/2014  . Chronic diastolic heart failure ([place]) 12/03/2014  . CKD (chronic kidney disease) stage 3, GFR 30-59 ml/min (HCC) 11/21/2014  . COPD (chronic obstructive pulmonary disease) ([place]) 07/07/2016  . DM2 (diabetes mellitus, type 2) ([place])   . Edema, peripheral 07/07/2016   Overview:  increase lasix to 40 mg bid  . Enthesopathy 07/07/2016  . Esophagitis, reflux 07/07/2016  . Essential hypertension 05/06/2015  . Functional diarrhea 07/07/2016  . GERD (gastroesophageal reflux disease)   . Headache 07/07/2016  . Heart failure ([place])   . History of diverticulosis 07/07/2016  . HLD (hyperlipidemia)   . HTN (hypertension)   . Hyperlipidemia 05/06/2015  . Hypertensive heart disease with heart failure ([place]) 12/03/2014  . Hypothyroid 07/07/2016  . Hypothyroidism   . Irritable bowel syndrome 07/07/2016   Overview:  IBS information sheet provided,  dietary changes suggested  . Low back pain 07/07/2016   Overview:  lumbar spine films at [facility]  . Major depressive disorder, recurrent, moderate ([place]) 07/07/2016  . Microalbuminuria due to type 2 diabetes mellitus ([place]) 05/06/2015  . Midline low back pain without sciatica 07/07/2016   Overview:  --RTC for warning signs (Fevers, chills, loss of bowel/bladder function, saddle numbness),  worsening pain or failure for pain to improve with symptomatic treatment --reviewed natural course of disease that acute LBP takes 4-6 weeks to recover --cont meds listed for symptomatic relief --follow up in 2-4 weeks or earlier if warning signs develop  . Nerve pain 07/07/2016  . Obesity   . Obesity (BMI 30-39.9) 05/06/2015  . OSA (obstructive sleep apnea) 07/07/2016  . Osteoarthritis 07/07/2016  . Other ventral hernia 07/07/2016  . Overweight 07/07/2016  . Pacemaker 11/21/2014  . PAF (paroxysmal atrial fibrillation) ([place])   . Presence of permanent cardiac pacemaker    09-24-13 01-21-14 Last interrogation in Epic at [place]  . Rhinosinusitis 07/07/2016  . Seasonal allergies 07/07/2016  . Sleep apnea    No cpap used  . Smoker 07/07/2016  . SSS (sick sinus syndrome) ([place]) 11/21/2014  . Symptomatic menopausal or female climacteric states 07/07/2016  . Thoracic back pain 07/07/2016  . Type II or unspecified type diabetes mellitus with renal manifestations, uncontrolled 07/07/2016    Past Surgical History:  Procedure Laterality Date  .  CARPAL TUNNEL RELEASE    . CHOLECYSTECTOMY    . ORIF ANKLE FRACTURE Left 02/21/2014   Procedure: OPEN REDUCTION INTERNAL FIXATION (ORIF) LEFT  ANKLE FRACTURE;  Surgeon: Tobi Bastos, MD;  Location: [facility] ORS;  Service: Orthopedics;  Laterality: Left;  . PACEMAKER INSERTION  09-24-13  . TOTAL HIP ARTHROPLASTY    . TUBAL LIGATION      MEDICATIONS: . albuterol (PROVENTIL HFA;VENTOLIN HFA) 108 (90 Base) MCG/ACT inhaler  . apixaban (ELIQUIS) 5 MG TABS tablet  . atorvastatin (LIPITOR) 20 MG tablet  . benazepril (LOTENSIN) 10 MG tablet  . escitalopram (LEXAPRO) 20 MG tablet  . Insulin Detemir (LEVEMIR FLEXTOUCH) 100 UNIT/ML Pen  . levothyroxine (SYNTHROID, LEVOTHROID) 150 MCG tablet  . liraglutide (VICTOZA) 18 MG/3ML SOPN  . metoprolol tartrate (LOPRESSOR) 50 MG tablet  . pantoprazole (PROTONIX) 40 MG tablet  . SYMBICORT 160-4.5 MCG/ACT inhaler  .  torsemide (DEMADEX) 20 MG tablet  . Vitamin D, Ergocalciferol, (DRISDOL) 50000 units CAPS capsule   No current facility-administered medications for this encounter.     Wynonia Musty [facility] Short Stay Center/Anesthesiology Phone [phone] 07/27/2017 1:16 PM

## 2017-08-01 ENCOUNTER — Ambulatory Visit
Admission: RE | Admit: 2017-08-01 | Discharge: 2017-08-01 | Disposition: A | Discharge status: Home / Self Care | Payer: BLUE CROSS/BLUE SHIELD | Source: Ambulatory Visit | Attending: Surgery | Admitting: Surgery

## 2017-08-01 DIAGNOSIS — D241 Benign neoplasm of right breast: Secondary | ICD-10-CM

## 2017-08-01 MED ORDER — DEXTROSE 5 % IV SOLN
3.0000 g | INTRAVENOUS | Status: AC
  Administered 2017-08-02: 3 g via INTRAVENOUS
  Filled 2017-08-01: qty 3

## 2017-08-02 ENCOUNTER — Encounter (HOSPITAL_COMMUNITY)
Admission: RE | Disposition: A | Discharge status: Home / Self Care | Payer: Self-pay | Source: Ambulatory Visit | Attending: Surgery

## 2017-08-02 ENCOUNTER — Ambulatory Visit (HOSPITAL_COMMUNITY): Payer: BLUE CROSS/BLUE SHIELD | Admitting: Physician Assistant

## 2017-08-02 ENCOUNTER — Ambulatory Visit (HOSPITAL_COMMUNITY): Payer: BLUE CROSS/BLUE SHIELD | Admitting: Anesthesiology

## 2017-08-02 ENCOUNTER — Ambulatory Visit
Admission: RE | Admit: 2017-08-02 | Discharge: 2017-08-02 | Disposition: A | Discharge status: Home / Self Care | Payer: BLUE CROSS/BLUE SHIELD | Source: Ambulatory Visit | Attending: Surgery | Admitting: Surgery

## 2017-08-02 ENCOUNTER — Ambulatory Visit (HOSPITAL_COMMUNITY)
Admission: RE | Admit: 2017-08-02 | Discharge: 2017-08-02 | Disposition: A | Discharge status: Home / Self Care | Payer: BLUE CROSS/BLUE SHIELD | Source: Ambulatory Visit | Attending: Surgery | Admitting: Surgery

## 2017-08-02 ENCOUNTER — Encounter (HOSPITAL_COMMUNITY): Payer: Self-pay | Admitting: *Deleted

## 2017-08-02 ENCOUNTER — Other Ambulatory Visit: Payer: Self-pay

## 2017-08-02 DIAGNOSIS — I509 Heart failure, unspecified: Secondary | ICD-10-CM | POA: Diagnosis not present

## 2017-08-02 DIAGNOSIS — G4730 Sleep apnea, unspecified: Secondary | ICD-10-CM | POA: Diagnosis not present

## 2017-08-02 DIAGNOSIS — E7800 Pure hypercholesterolemia, unspecified: Secondary | ICD-10-CM | POA: Insufficient documentation

## 2017-08-02 DIAGNOSIS — Z794 Long term (current) use of insulin: Secondary | ICD-10-CM | POA: Diagnosis not present

## 2017-08-02 DIAGNOSIS — F329 Major depressive disorder, single episode, unspecified: Secondary | ICD-10-CM | POA: Insufficient documentation

## 2017-08-02 DIAGNOSIS — Z950 Presence of cardiac pacemaker: Secondary | ICD-10-CM | POA: Insufficient documentation

## 2017-08-02 DIAGNOSIS — Z79899 Other long term (current) drug therapy: Secondary | ICD-10-CM | POA: Diagnosis not present

## 2017-08-02 DIAGNOSIS — D241 Benign neoplasm of right breast: Secondary | ICD-10-CM | POA: Diagnosis not present

## 2017-08-02 DIAGNOSIS — Z79890 Hormone replacement therapy (postmenopausal): Secondary | ICD-10-CM | POA: Insufficient documentation

## 2017-08-02 DIAGNOSIS — I110 Hypertensive heart disease with heart failure: Secondary | ICD-10-CM | POA: Insufficient documentation

## 2017-08-02 DIAGNOSIS — K219 Gastro-esophageal reflux disease without esophagitis: Secondary | ICD-10-CM | POA: Insufficient documentation

## 2017-08-02 DIAGNOSIS — E119 Type 2 diabetes mellitus without complications: Secondary | ICD-10-CM | POA: Diagnosis not present

## 2017-08-02 DIAGNOSIS — E039 Hypothyroidism, unspecified: Secondary | ICD-10-CM | POA: Diagnosis not present

## 2017-08-02 DIAGNOSIS — Z87891 Personal history of nicotine dependence: Secondary | ICD-10-CM | POA: Diagnosis not present

## 2017-08-02 DIAGNOSIS — F419 Anxiety disorder, unspecified: Secondary | ICD-10-CM | POA: Diagnosis not present

## 2017-08-02 DIAGNOSIS — Z6841 Body Mass Index (BMI) 40.0 and over, adult: Secondary | ICD-10-CM | POA: Insufficient documentation

## 2017-08-02 HISTORY — PX: BREAST LUMPECTOMY WITH RADIOACTIVE SEED LOCALIZATION: SHX6424

## 2017-08-02 LAB — GLUCOSE, CAPILLARY
GLUCOSE-CAPILLARY: 123 mg/dL — AB (ref 70–99)
Glucose-Capillary: 319 mg/dL — ABNORMAL HIGH (ref 70–99)
Glucose-Capillary: 172 mg/dL — ABNORMAL HIGH (ref 70–99)

## 2017-08-02 SURGERY — BREAST LUMPECTOMY WITH RADIOACTIVE SEED LOCALIZATION
Anesthesia: General | Site: Breast | Laterality: Right

## 2017-08-02 MED ORDER — ACETAMINOPHEN 500 MG PO TABS: 1000.0000 mg | ORAL_TABLET | ORAL | Status: DC

## 2017-08-02 MED ORDER — IBUPROFEN 800 MG PO TABS: 800.0000 mg | ORAL_TABLET | Freq: Three times a day (TID) | ORAL | 0 refills | Status: DC | PRN

## 2017-08-02 MED ORDER — FENTANYL CITRATE (PF) 100 MCG/2ML IJ SOLN: 25.0000 ug | INTRAMUSCULAR | Status: DC | PRN

## 2017-08-02 MED ORDER — CELECOXIB 200 MG PO CAPS: 200.0000 mg | ORAL_CAPSULE | ORAL | Status: DC

## 2017-08-02 MED ORDER — GABAPENTIN 300 MG PO CAPS
ORAL_CAPSULE | ORAL | Status: AC
  Administered 2017-08-02: 300 mg
  Filled 2017-08-02: qty 1

## 2017-08-02 MED ORDER — PROPOFOL 10 MG/ML IV BOLUS
INTRAVENOUS | Status: AC
  Filled 2017-08-02: qty 20

## 2017-08-02 MED ORDER — DEXAMETHASONE SODIUM PHOSPHATE 10 MG/ML IJ SOLN
INTRAMUSCULAR | Status: DC | PRN
  Administered 2017-08-02: 4 mg via INTRAVENOUS

## 2017-08-02 MED ORDER — PROPOFOL 10 MG/ML IV BOLUS
INTRAVENOUS | Status: DC | PRN
  Administered 2017-08-02: 150 mg via INTRAVENOUS

## 2017-08-02 MED ORDER — MIDAZOLAM HCL 2 MG/2ML IJ SOLN
INTRAMUSCULAR | Status: AC
  Filled 2017-08-02: qty 2

## 2017-08-02 MED ORDER — BUPIVACAINE-EPINEPHRINE 0.25% -1:200000 IJ SOLN
INTRAMUSCULAR | Status: DC | PRN
  Administered 2017-08-02: 20 mL

## 2017-08-02 MED ORDER — GABAPENTIN 300 MG PO CAPS: 300.0000 mg | ORAL_CAPSULE | ORAL | Status: DC

## 2017-08-02 MED ORDER — ROCURONIUM BROMIDE 10 MG/ML (PF) SYRINGE
PREFILLED_SYRINGE | INTRAVENOUS | Status: AC
  Filled 2017-08-02: qty 30

## 2017-08-02 MED ORDER — CHLORHEXIDINE GLUCONATE CLOTH 2 % EX PADS: 6.0000 | MEDICATED_PAD | Freq: Once | CUTANEOUS | Status: DC

## 2017-08-02 MED ORDER — INSULIN ASPART 100 UNIT/ML ~~LOC~~ SOLN: 5.0000 [IU] | Freq: Once | SUBCUTANEOUS | Status: DC

## 2017-08-02 MED ORDER — MIDAZOLAM HCL 5 MG/5ML IJ SOLN
INTRAMUSCULAR | Status: DC | PRN
  Administered 2017-08-02: 1 mg via INTRAVENOUS

## 2017-08-02 MED ORDER — FENTANYL CITRATE (PF) 250 MCG/5ML IJ SOLN
INTRAMUSCULAR | Status: DC | PRN
  Administered 2017-08-02 (×2): 50 ug via INTRAVENOUS

## 2017-08-02 MED ORDER — LIDOCAINE HCL (CARDIAC) PF 100 MG/5ML IV SOSY
PREFILLED_SYRINGE | INTRAVENOUS | Status: DC | PRN
  Administered 2017-08-02: 50 mg via INTRAVENOUS

## 2017-08-02 MED ORDER — SUGAMMADEX SODIUM 200 MG/2ML IV SOLN
INTRAVENOUS | Status: AC
  Filled 2017-08-02: qty 4

## 2017-08-02 MED ORDER — DEXAMETHASONE SODIUM PHOSPHATE 10 MG/ML IJ SOLN
INTRAMUSCULAR | Status: AC
  Filled 2017-08-02: qty 3

## 2017-08-02 MED ORDER — LIDOCAINE 2% (20 MG/ML) 5 ML SYRINGE
INTRAMUSCULAR | Status: AC
  Filled 2017-08-02: qty 30

## 2017-08-02 MED ORDER — PHENYLEPHRINE 40 MCG/ML (10ML) SYRINGE FOR IV PUSH (FOR BLOOD PRESSURE SUPPORT)
PREFILLED_SYRINGE | INTRAVENOUS | Status: AC
  Filled 2017-08-02: qty 10

## 2017-08-02 MED ORDER — LACTATED RINGERS IV SOLN
INTRAVENOUS | Status: DC
  Administered 2017-08-02: 08:00:00 via INTRAVENOUS

## 2017-08-02 MED ORDER — BUPIVACAINE-EPINEPHRINE (PF) 0.25% -1:200000 IJ SOLN
INTRAMUSCULAR | Status: AC
  Filled 2017-08-02: qty 30

## 2017-08-02 MED ORDER — PHENYLEPHRINE HCL 10 MG/ML IJ SOLN
INTRAMUSCULAR | Status: DC | PRN
  Administered 2017-08-02: 40 ug/min via INTRAVENOUS

## 2017-08-02 MED ORDER — PHENYLEPHRINE HCL 10 MG/ML IJ SOLN
INTRAMUSCULAR | Status: DC | PRN
  Administered 2017-08-02: 40 ug via INTRAVENOUS

## 2017-08-02 MED ORDER — ONDANSETRON HCL 4 MG/2ML IJ SOLN
INTRAMUSCULAR | Status: DC | PRN
  Administered 2017-08-02: 4 mg via INTRAVENOUS

## 2017-08-02 MED ORDER — INSULIN ASPART 100 UNIT/ML ~~LOC~~ SOLN
SUBCUTANEOUS | Status: AC
  Administered 2017-08-02: 5 [IU] via INTRAVENOUS
  Filled 2017-08-02: qty 1

## 2017-08-02 MED ORDER — ONDANSETRON HCL 4 MG/2ML IJ SOLN
INTRAMUSCULAR | Status: AC
  Filled 2017-08-02: qty 6

## 2017-08-02 MED ORDER — ONDANSETRON HCL 4 MG/2ML IJ SOLN: 4.0000 mg | Freq: Once | INTRAMUSCULAR | Status: DC | PRN

## 2017-08-02 MED ORDER — 0.9 % SODIUM CHLORIDE (POUR BTL) OPTIME
TOPICAL | Status: DC | PRN
  Administered 2017-08-02: 1000 mL

## 2017-08-02 MED ORDER — CELECOXIB 200 MG PO CAPS
ORAL_CAPSULE | ORAL | Status: AC
  Administered 2017-08-02: 200 mg
  Filled 2017-08-02: qty 1

## 2017-08-02 MED ORDER — ACETAMINOPHEN 500 MG PO TABS
ORAL_TABLET | ORAL | Status: AC
  Administered 2017-08-02: 1000 mg
  Filled 2017-08-02: qty 2

## 2017-08-02 MED ORDER — FENTANYL CITRATE (PF) 250 MCG/5ML IJ SOLN
INTRAMUSCULAR | Status: AC
  Filled 2017-08-02: qty 5

## 2017-08-02 SURGICAL SUPPLY — 41 items
ADH SKN CLS APL DERMABOND .7 (GAUZE/BANDAGES/DRESSINGS) ×1
APPLIER CLIP 9.375 MED OPEN (MISCELLANEOUS)
APR CLP MED 9.3 20 MLT OPN (MISCELLANEOUS)
BINDER BREAST LRG (GAUZE/BANDAGES/DRESSINGS) IMPLANT
BINDER BREAST XLRG (GAUZE/BANDAGES/DRESSINGS) ×2 IMPLANT
CANISTER SUCT 3000ML PPV (MISCELLANEOUS) IMPLANT
CHLORAPREP W/TINT 26ML (MISCELLANEOUS) ×3 IMPLANT
CLIP APPLIE 9.375 MED OPEN (MISCELLANEOUS) IMPLANT
COVER PROBE W GEL 5X96 (DRAPES) ×3 IMPLANT
COVER SURGICAL LIGHT HANDLE (MISCELLANEOUS) ×3 IMPLANT
DERMABOND ADVANCED (GAUZE/BANDAGES/DRESSINGS) ×2
DERMABOND ADVANCED .7 DNX12 (GAUZE/BANDAGES/DRESSINGS) ×1 IMPLANT
DEVICE DUBIN SPECIMEN MAMMOGRA (MISCELLANEOUS) ×3 IMPLANT
DRAPE CHEST BREAST 15X10 FENES (DRAPES) ×3 IMPLANT
ELECT CAUTERY BLADE 6.4 (BLADE) ×3 IMPLANT
ELECT REM PT RETURN 9FT ADLT (ELECTROSURGICAL) ×3
ELECTRODE REM PT RTRN 9FT ADLT (ELECTROSURGICAL) ×1 IMPLANT
GLOVE BIO SURGEON STRL SZ8 (GLOVE) ×7 IMPLANT
GLOVE BIOGEL PI IND STRL 8 (GLOVE) ×1 IMPLANT
GLOVE BIOGEL PI INDICATOR 8 (GLOVE) ×6
GOWN STRL REUS W/ TWL XL LVL3 (GOWN DISPOSABLE) ×1 IMPLANT
GOWN STRL REUS W/TWL XL LVL3 (GOWN DISPOSABLE) ×6
KIT BASIN OR (CUSTOM PROCEDURE TRAY) ×3 IMPLANT
KIT MARKER MARGIN INK (KITS) ×2 IMPLANT
LIGHT WAVEGUIDE WIDE FLAT (MISCELLANEOUS) IMPLANT
NDL HYPO 25GX1X1/2 BEV (NEEDLE) IMPLANT
NEEDLE HYPO 25GX1X1/2 BEV (NEEDLE) ×3 IMPLANT
NS IRRIG 1000ML POUR BTL (IV SOLUTION) IMPLANT
PACK GENERAL/GYN (CUSTOM PROCEDURE TRAY) ×3 IMPLANT
PENCIL BUTTON HOLSTER BLD 10FT (ELECTRODE) ×3 IMPLANT
SPONGE LAP 18X18 X RAY DECT (DISPOSABLE) IMPLANT
SUT MNCRL AB 4-0 PS2 18 (SUTURE) ×3 IMPLANT
SUT SILK 2 0 SH (SUTURE) ×2 IMPLANT
SUT VIC AB 2-0 SH 27 (SUTURE) ×3
SUT VIC AB 2-0 SH 27XBRD (SUTURE) ×1 IMPLANT
SUT VIC AB 3-0 SH 27 (SUTURE) ×3
SUT VIC AB 3-0 SH 27X BRD (SUTURE) ×1 IMPLANT
SYR BULB 3OZ (MISCELLANEOUS) ×3 IMPLANT
SYR CONTROL 10ML LL (SYRINGE) ×2 IMPLANT
TOWEL NATURAL 6PK STERILE (DISPOSABLE) ×2 IMPLANT
YANKAUER SUCT BULB TIP NO VENT (SUCTIONS) IMPLANT

## 2017-08-02 NOTE — Anesthesia Postprocedure Evaluation (Signed)
Anesthesia Post Note  Patient: Carolyn [name]  Procedure(s) Performed: BREAST LUMPECTOMY WITH RADIOACTIVE SEED LOCALIZATION (Right Breast)     Patient location during evaluation: PACU Anesthesia Type: General Level of consciousness: awake and alert Pain management: pain level controlled Vital Signs Assessment: post-procedure vital signs reviewed and stable Respiratory status: spontaneous breathing, nonlabored ventilation and respiratory function stable Cardiovascular status: blood pressure returned to baseline and stable Postop Assessment: no apparent nausea or vomiting Anesthetic complications: no    Last Vitals:  Vitals:   08/02/17 1055 08/02/17 1100  BP:    Pulse: 66 65  Resp: 16 16  Temp:  36.6 C  SpO2: 95% 95%    Last Pain:  Vitals:   08/02/17 1020  TempSrc:   PainSc: 0-No pain                 Catalina Gravel

## 2017-08-02 NOTE — Op Note (Signed)
Preoperative diagnosis: Right breast upper inner quadrant papilloma  Postop diagnosis: Same  Procedure: Right breast seed localized lumpectomy  Surgeon: Thomas Cornett, MD  Anesthesia: LMA with local  EBL: 10 cc  Specimen: Right breast tissue with seed and clip verified by Faxitron  Drains: None  IVF: Per anesthesia record    Indications for procedure: The patient presents for right breast seed localized lumpectomy due to papilloma diagnosed from mammography and core biopsy.  Risk benefits and other options were discussed.The procedure has been discussed with the patient. Alternatives to surgery have been discussed with the patient.  Risks of surgery include bleeding,  Infection,  Seroma formation, death,  and the need for further surgery.   The patient understands and wishes to proceed.    Description of procedure: The patient was met in the holding area and questions were answered.  She underwent seed placement as an outpatient.  Her right breast was marked as the correct side and neoprobe used to verify proper seed location in the holding area.  All questions were answered.  She was taken the operating room placed supine.  After induction of general anesthesia the right breast was prepped and draped in sterile fashion.  Films were available for review.  Timeout was done to verify proper patient, procedure and location.  Local anesthetic was infiltrated in the right breast upper inner quadrant.  Transverse incision made with the help of the neoprobe and the seed and clip were identified and totally excised with grossly negative margins.  Hemostasis achieved with cautery.  Irrigation used.  Local anesthetic infiltrated into the incision.  Wound closed with 3-0 Vicryl and 4-0 Monocryl.  Dermabond applied.  All final counts found to be correct.  The patient was awoke extubated taken to recovery in satisfactory condition. 

## 2017-08-02 NOTE — Discharge Instructions (Signed)
[facility] °Office Phone Number [phone] ° °BREAST BIOPSY/ PARTIAL MASTECTOMY: POST OP INSTRUCTIONS ° °Always review your discharge instruction sheet given to you by the facility where your surgery was performed. ° °IF YOU HAVE DISABILITY OR FAMILY LEAVE FORMS, YOU MUST BRING THEM TO THE OFFICE FOR PROCESSING.  DO NOT GIVE THEM TO YOUR DOCTOR. ° °1. A prescription for pain medication may be given to you upon discharge.  Take your pain medication as prescribed, if needed.  If narcotic pain medicine is not needed, then you may take acetaminophen (Tylenol) or ibuprofen (Advil) as needed. °2. Take your usually prescribed medications unless otherwise directed °3. If you need a refill on your pain medication, please contact your pharmacy.  They will contact our office to request authorization.  Prescriptions will not be filled after 5pm or on week-ends. °4. You should eat very light the first 24 hours after surgery, such as soup, crackers, pudding, etc.  Resume your normal diet the day after surgery. °5. Most patients will experience some swelling and bruising in the breast.  Ice packs and a good support bra will help.  Swelling and bruising can take several days to resolve.  °6. It is common to experience some constipation if taking pain medication after surgery.  Increasing fluid intake and taking a stool softener will usually help or prevent this problem from occurring.  A mild laxative (Milk of Magnesia or Miralax) should be taken according to package directions if there are no bowel movements after 48 hours. °7. Unless discharge instructions indicate otherwise, you may remove your bandages 24-48 hours after surgery, and you may shower at that time.  You may have steri-strips (small skin tapes) in place directly over the incision.  These strips should be left on the skin for 7-10 days.  If your surgeon used skin glue on the incision, you may shower in 24 hours.  The glue will flake off over the  next 2-3 weeks.  Any sutures or staples will be removed at the office during your follow-up visit. °8. ACTIVITIES:  You may resume regular daily activities (gradually increasing) beginning the next day.  Wearing a good support bra or sports bra minimizes pain and swelling.  You may have sexual intercourse when it is comfortable. °a. You may drive when you no longer are taking prescription pain medication, you can comfortably wear a seatbelt, and you can safely maneuver your car and apply brakes. °b. RETURN TO WORK:  ______________________________________________________________________________________ °9. You should see your doctor in the office for a follow-up appointment approximately two weeks after your surgery.  Your doctor’s nurse will typically make your follow-up appointment when she calls you with your pathology report.  Expect your pathology report 2-3 business days after your surgery.  You may call to check if you do not hear from us after three days. °10. OTHER INSTRUCTIONS: _______________________________________________________________________________________________ _____________________________________________________________________________________________________________________________________ °_____________________________________________________________________________________________________________________________________ °_____________________________________________________________________________________________________________________________________ ° °WHEN TO CALL YOUR DOCTOR: °1. Fever over 101.0 °2. Nausea and/or vomiting. °3. Extreme swelling or bruising. °4. Continued bleeding from incision. °5. Increased pain, redness, or drainage from the incision. ° °The clinic staff is available to answer your questions during regular business hours.  Please don’t hesitate to call and ask to speak to one of the nurses for clinical concerns.  If you have a medical emergency, go to the nearest  emergency room or call 911.  A surgeon from [facility] is always on call at the hospital. ° °For further questions, please visit [email or web address]  °

## 2017-08-02 NOTE — Progress Notes (Signed)
Pt. Arrived to short stay and blood sugar  Was 319, notified Dr. Gifford Shave, stated he will come see pt. Pt. Checked blood sugar this am at 0545 and sugar was 176. Pt. Drank ensure at 0546.

## 2017-08-02 NOTE — Anesthesia Preprocedure Evaluation (Addendum)
Anesthesia Evaluation  Patient identified by MRN, date of birth, ID band Patient awake    Reviewed: Allergy & Precautions, NPO status , Patient's Chart, lab work & pertinent test results, reviewed documented beta blocker date and time   Airway Mallampati: III  TM Distance: >3 FB Neck ROM: Full    Dental  (+) Teeth Intact, Dental Advisory Given   Pulmonary sleep apnea , COPD,  COPD inhaler, former smoker,    Pulmonary exam normal breath sounds clear to auscultation       Cardiovascular hypertension, Pt. on home beta blockers and Pt. on medications (-) angina+CHF  (-) CAD and (-) Past MI Normal cardiovascular exam+ dysrhythmias Atrial Fibrillation + pacemaker (SSS s/p PPM)  Rhythm:Regular Rate:Normal     Neuro/Psych  Headaches, PSYCHIATRIC DISORDERS Anxiety Depression    GI/Hepatic Neg liver ROS, GERD  Medicated and Controlled,  Endo/Other  diabetes, Type 2, Insulin DependentHypothyroidism Morbid obesity  Renal/GU Renal InsufficiencyRenal disease     Musculoskeletal  (+) Arthritis , Osteoarthritis,    Abdominal   Peds  Hematology  (+) Blood dyscrasia (Eliquis), ,   Anesthesia Other Findings Day of surgery medications reviewed with the patient.  RIGHT BREAST PAPILLOMA  Reproductive/Obstetrics                             Anesthesia Physical Anesthesia Plan  ASA: IV  Anesthesia Plan: General   Post-op Pain Management:    Induction: Intravenous  PONV Risk Score and Plan: 3 and Midazolam, Dexamethasone and Ondansetron  Airway Management Planned: LMA  Additional Equipment:   Intra-op Plan:   Post-operative Plan: Extubation in OR  Informed Consent: I have reviewed the patients History and Physical, chart, labs and discussed the procedure including the risks, benefits and alternatives for the proposed anesthesia with the patient or authorized representative who has indicated his/her  understanding and acceptance.   Dental advisory given  Plan Discussed with: CRNA  Anesthesia Plan Comments: (CBG 319 in Pre-op. Will give IV insulin and re-check prior to proceeding to OR. Have MAGNET available.)       Anesthesia Quick Evaluation

## 2017-08-02 NOTE — H&P (Signed)
Carolyn [name] Documented Location: [facility] Patient #: [id number] DOB: 1950-07-06 Divorced / Language: Carolyn [name] / Race: White Female  History of Present Illness [name] A. Toshika Parrow MD; Patient words: Patient is set at the request of Dr. [name] due to an abnormal mammogram. The patient went for screening mammogram was found to have an area of distortion in the right breast upper inner quadrant. Core biopsy was done which showed papilloma. Patient denies any history of breast mass, breast pain, nipple discharge or the problem with either breast. There is no strong family history of breast cancer.        ADDENDUM REPORT: 06/16/2017 07:53 ADDENDUM: Pathology revealed INTRADUCTAL PAPILLOMA, FIBROCYSTIC CHANGE of RIGHT breast, upper inner. This was found to be concordant by Dr. [name], with excision recommended. Pathology results were discussed with the patient by telephone. The patient reported doing well after the biopsy with tenderness at the site. Post biopsy instructions and care were reviewed and questions were answered. The patient was encouraged to call [facility] for any additional concerns. Surgical consultation has been arranged with Dr. Erroll Luna at [facility] on Jun 21, 2017. Pathology results reported by [name], RN on 06/16/2017. Electronically Signed By: [name] III M.D On: 06/16/2017 07:53 Addended by [name], MD on 06/16/2017 10:25 AM  Study Result CLINICAL DATA: Right breast mass EXAM: RIGHT BREAST STEREOTACTIC CORE NEEDLE BIOPSY COMPARISON: Previous exams. FINDINGS: The patient and I discussed the procedure of stereotactic-guided biopsy including benefits and alternatives. We discussed the high likelihood of a successful procedure. We discussed the risks of the procedure including infection, bleeding, tissue injury, clip migration, and inadequate  sampling. Informed written consent was given. The usual time out protocol was performed immediately prior to the procedure. Using sterile technique and 1% Lidocaine as local anesthetic, under stereotactic guidance, a 9 gauge vacuum assisted device was used to perform core needle biopsy of a mass in the medial superior right breast using a superior approach. Lesion quadrant: Upper inner At the conclusion of the procedure, a tissue marker clip was deployed into the biopsy cavity. Follow-up 2-view mammogram was performed and dictated separately. IMPRESSION: Stereotactic-guided biopsy of a mass in the upper inner right breast. No apparent complications. Electronically Signed: By: [name] III M.D On: 06/14/2017 09:59          Diagnosis Breast, right, needle core biopsy, upper inner - INTRADUCTAL PAPILLOMA. - FIBROCYSTIC CHANGE. - NO MALIGNANCY IDENTIFIED. Microscopic Comment The case was called to [facility] on 06/15/2017.  The patient is a 67 year old female.   Past Surgical History (Angela HolmesBreast Biopsy Bilateral. Colon Polyp Removal - Colonoscopy Foot Surgery Left. Gallbladder Surgery - Laparoscopic Hip Surgery Left.  Diagnostic Studies History [name]; Colonoscopy 1-5 years ago Mammogram within last year Pap Smear 1-5 years ago  Allergies [name]; No Known Drug Allergies [06/21/2017]: Allergies Reconciled  Medication History (Angela Holmes Metoprolol Tartrate (50MG  Tablet, Oral) Active. Pantoprazole Sodium (40MG  Tablet DR, Oral) Active. Levothyroxine Sodium (150MCG Tablet, Oral) Active. Levemir FlexTouch (100UNIT/ML Soln Pen-inj, Subcutaneous) Active. Escitalopram Oxalate (20MG  Tablet, Oral) Active. Benazepril HCl (10MG  Tablet, Oral) Active. Atorvastatin Calcium (20MG  Tablet, Oral) Active. Benzonatate (100MG  Capsule, Oral) Active. Medications Reconciled  Social History [name];Caffeine use Carbonated beverages, Coffee, Tea. No alcohol use No drug use Tobacco use Former smoker.  Family History [name];Arthritis Brother, Father, Mother, Sister. Diabetes Mellitus Brother, Father, Sister, Son. Hypertension Brother, Father, Mother,  Sister, Son. Kidney Disease Brother. Migraine Headache Son.  Pregnancy / Birth History [name]; ) Age at menarche 9 years. Age of menopause 33-55 Gravida 2 Maternal age 13-20 Para 2  Other Problems ([name] Arthritis Atrial Fibrillation Chest pain Cholelithiasis Congestive Heart Failure Diabetes Mellitus Gastroesophageal Reflux Disease Heart murmur Hypercholesterolemia Thyroid Disease     Review of Systems (Katheren Jimmerson A. Carter Kaman MD General Not Present- Appetite Loss, Chills, Fatigue, Fever, Night Sweats, Weight Gain and Weight Loss. Skin Present- Dryness. Not Present- Change in Wart/Mole, Hives, Jaundice, New Lesions, Non-Healing Wounds, Rash and Ulcer. HEENT Present- Hoarseness, Seasonal Allergies and Wears glasses/contact lenses. Not Present- Earache, Hearing Loss, Nose Bleed, Oral Ulcers, Ringing in the Ears, Sinus Pain, Sore Throat, Visual Disturbances and Yellow Eyes. Cardiovascular Present- Chest Pain. Not Present- Difficulty Breathing Lying Down, Leg Cramps, Palpitations, Rapid Heart Rate, Shortness of Breath and Swelling of Extremities. Gastrointestinal Not Present- Abdominal Pain, Bloating, Bloody Stool, Change in Bowel Habits, Chronic diarrhea, Constipation, Difficulty Swallowing, Excessive gas, Gets full quickly at meals, Hemorrhoids, Indigestion, Nausea, Rectal Pain and Vomiting. Female Genitourinary Not Present- Frequency, Nocturia, Painful Urination, Pelvic Pain and Urgency. Musculoskeletal Present- Back Pain. Not Present- Joint Pain, Joint Stiffness, Muscle Pain, Muscle Weakness and Swelling of Extremities. Neurological Not Present- Decreased Memory,  Fainting, Headaches, Numbness, Seizures, Tingling, Tremor, Trouble walking and Weakness. Psychiatric Not Present- Anxiety, Bipolar, Change in Sleep Pattern, Depression, Fearful and Frequent crying. Endocrine Present- Heat Intolerance. Not Present- Cold Intolerance, Excessive Hunger, Hair Changes, Hot flashes and New Diabetes. Hematology Present- Blood Thinners. Not Present- Easy Bruising, Excessive bleeding, Gland problems, HIV and Persistent Infections. All other systems negative  Vitals [name] Weight: 233.5 lb Height: 63in Body Surface Area: 2.07 m Body Mass Index: 41.36 kg/m  Temp.: 98.5F(Oral)  Pulse: 94 (Regular)  BP: 134/82 (Sitting, Left Arm, Standard)      Physical Exam (Laniya Friedl A. Moselle Rister MD  General Mental Status-Alert. General Appearance-Consistent with stated age. Hydration-Well hydrated. Voice-Normal.  Head and Neck Head-normocephalic, atraumatic with no lesions or palpable masses. Trachea-midline. Thyroid Gland Characteristics - normal size and consistency.  Chest and Lung Exam Chest and lung exam reveals -quiet, even and easy respiratory effort with no use of accessory muscles and on auscultation, normal breath sounds, no adventitious sounds and normal vocal resonance. Inspection Chest Wall - Normal. Back - normal.  Breast Breast - Left-Symmetric, Non Tender, No Biopsy scars, no Dimpling, No Inflammation, No Lumpectomy scars, No Mastectomy scars, No Peau d' Orange. Breast - Right-Symmetric, Non Tender, No Biopsy scars, no Dimpling, No Inflammation, No Lumpectomy scars, No Mastectomy scars, No Peau d' Orange. Breast Lump-No Palpable Breast Mass.  Neurologic Neurologic evaluation reveals -alert and oriented x 3 with no impairment of recent or remote memory. Mental Status-Normal.  Musculoskeletal Normal Exam - Left-Upper Extremity Strength Normal and Lower Extremity Strength Normal. Normal Exam -  Right-Upper Extremity Strength Normal and Lower Extremity Strength Normal.  Lymphatic Head & Neck  General Head & Neck Lymphatics: Bilateral - Description - Normal. Axillary  General Axillary Region: Bilateral - Description - Normal. Tenderness - Non Tender.    Assessment & Plan (Jeffie Spivack A. Khristin Keleher MD;  PAPILLOMA OF RIGHT BREAST (D24.1) Impression: recommend lumpectomy on the right 1 in 5 lesions are upgraded to a malignant lesion needs cardiac clearance Risk of lumpectomy include bleeding, infection, seroma, more surgery, use of seed/wire, wound care, cosmetic deformity and the need for other treatments, death , blood clots, death. Pt agrees to proceed.  Current Plans Pt Education -  CCS Breast Biopsy HCI: discussed with patient and provided information. The anatomy and the physiology was discussed. The pathophysiology and natural history of the disease was discussed. Options were discussed and recommendations were made. Technique, risks, benefits, & alternatives were discussed. Risks such as stroke, heart attack, bleeding, indection, death, and other risks discussed. Questions answered. The patient agrees to proceed.

## 2017-08-02 NOTE — Transfer of Care (Signed)
Immediate Anesthesia Transfer of Care Note  Patient: Carolyn [name]  Procedure(s) Performed: BREAST LUMPECTOMY WITH RADIOACTIVE SEED LOCALIZATION (Right Breast)  Patient Location: PACU  Anesthesia Type:General  Level of Consciousness: awake, alert  and oriented  Airway & Oxygen Therapy: Patient Spontanous Breathing and Patient connected to nasal cannula oxygen  Post-op Assessment: Report given to RN, Post -op Vital signs reviewed and stable and Patient moving all extremities X 4  Post vital signs: Reviewed and stable  Last Vitals:  Vitals Value Taken Time  BP 128/69 08/02/2017 10:20 AM  Temp    Pulse 70 08/02/2017 10:23 AM  Resp 19 08/02/2017 10:23 AM  SpO2 96 % 08/02/2017 10:23 AM  Vitals shown include unvalidated device data.  Last Pain:  Vitals:   08/02/17 0743  TempSrc: Oral  PainSc: 0-No pain         Complications: No apparent anesthesia complications

## 2017-08-02 NOTE — Anesthesia Procedure Notes (Signed)
Procedure Name: LMA Insertion Date/Time: 08/02/2017 9:39 AM Performed by: Mariea Clonts, CRNA Pre-anesthesia Checklist: Patient identified, Emergency Drugs available, Suction available and Patient being monitored Patient Re-evaluated:Patient Re-evaluated prior to induction Oxygen Delivery Method: Circle System Utilized Preoxygenation: Pre-oxygenation with 100% oxygen Induction Type: IV induction Ventilation: Mask ventilation without difficulty LMA: LMA inserted LMA Size: 4.0 Number of attempts: 1 Airway Equipment and Method: Bite block Placement Confirmation: positive ETCO2 Tube secured with: Tape Dental Injury: Teeth and Oropharynx as per pre-operative assessment

## 2017-08-03 ENCOUNTER — Encounter (HOSPITAL_COMMUNITY): Payer: Self-pay | Admitting: Surgery

## 2017-08-17 ENCOUNTER — Ambulatory Visit (INDEPENDENT_AMBULATORY_CARE_PROVIDER_SITE_OTHER): Payer: BLUE CROSS/BLUE SHIELD | Admitting: *Deleted

## 2017-08-17 ENCOUNTER — Telehealth: Payer: Self-pay | Admitting: Cardiology

## 2017-08-17 DIAGNOSIS — K909 Intestinal malabsorption, unspecified: Secondary | ICD-10-CM | POA: Insufficient documentation

## 2017-08-17 DIAGNOSIS — R197 Diarrhea, unspecified: Secondary | ICD-10-CM

## 2017-08-17 DIAGNOSIS — I495 Sick sinus syndrome: Secondary | ICD-10-CM

## 2017-08-17 HISTORY — DX: Intestinal malabsorption, unspecified: K90.9

## 2017-08-17 NOTE — Telephone Encounter (Signed)
LMOVM reminding pt to send remote transmission.   

## 2017-08-18 ENCOUNTER — Encounter: Payer: Self-pay | Admitting: Cardiology

## 2017-08-18 NOTE — Progress Notes (Signed)
Remote pacemaker transmission.   

## 2017-09-08 NOTE — Progress Notes (Signed)
Cardiology Office Note:    Date:  09/09/2017   ID:  MOXIE KALIL, DOB Oct 31, 1950, MRN [id number]  PCP:  Myrlene Broker, MD  Cardiologist:  Shirlee More, MD    Referring MD: Myrlene Broker, MD    ASSESSMENT:    1. Paroxysmal atrial fibrillation (HCC)   2. Chronic anticoagulation   3. Pacemaker   4. Hypertensive heart disease with heart failure ([place])    PLAN:    In order of problems listed above:  1. Stable had brief A. fib on device telemetry with no clinical recurrence she is off her antiarrhythmic drug pleased with the quality of her life and she will remain anticoagulated. 2. Continue her anticoagulant recent CBC is normal 3. Stable function followed in our device clinic will continue telemetry 4. Stable blood pressures at target continue her current loop diuretic she has no fluid overload recent renal function potassium creatinine are normal   Next appointment: 6 months   Medication Adjustments/Labs and Tests Ordered: Current medicines are reviewed at length with the patient today.  Concerns regarding medicines are outlined above.  No orders of the defined types were placed in this encounter.  No orders of the defined types were placed in this encounter.   Chief Complaint  Patient presents with  . Atrial Fibrillation    and pacemaker  . Hypertension    History of Present Illness:    CHRISTMAS FARACI is a 67 y.o. female with a hx of  paroxysmal atrial fibrillation CHADS2 vasc of 4 , hypertension, diabetes, and history of hyperthyroidism that was treated and she is now on supplement. She underwent cryo-balloon ablation of atrial fibrillation in December 2017. She has been maintained on Multaq,discontinued in July 2018. She has a dual-chamber pacemaker in placed for sinus node dysfunction and she was last seen 11/22/16. Compliance with diet, lifestyle and medications: Yes  Overall doing well she is discouraged because she had brief A. fib seen on telemetry.   She has had no clinical recurrence no chest pain shortness of breath palpitations syncope or TIA.  Recent labs are reviewed CBC normal renal function potassium normal and I reviewed her recent device telemetry with the patient. Past Medical History:  Diagnosis Date  . Acquired hypothyroidism 07/07/2016  . Acute gastritis 07/07/2016  . Allergic rhinitis 07/07/2016  . Anemia 07/07/2016  . Anxiety 07/07/2016  . Arthritis    Osteoarthritis  . ATRIAL FIBRILLATION 11/27/2008   Qualifier: Diagnosis of  By: Genevie Cheshire PharmD, Gay Filler    . Atypical chest pain 07/07/2016  . Benign essential hypertension 07/07/2016   Overview:  stable on current meds  . Benign paroxysmal positional vertigo 07/07/2016   Overview:  Avoid rapid headmovement and position change. May taper the gabapentin to see if helps.  . Bilateral low back pain without sciatica 07/07/2016   Overview:  continue gabapentin and meloxicam,  . Bimalleolar ankle fracture 02/21/2014  . Bronchitis 07/07/2016  . Carpal tunnel syndrome   . CHF (congestive heart failure) ([place])    [id number]  . Chronic anticoagulation 12/03/2014  . Chronic diastolic heart failure ([place]) 12/03/2014  . CKD (chronic kidney disease) stage 3, GFR 30-59 ml/min (HCC) 11/21/2014  . COPD (chronic obstructive pulmonary disease) ([place]) 07/07/2016  . DM2 (diabetes mellitus, type 2) ([place])   . Edema, peripheral 07/07/2016   Overview:  increase lasix to 40 mg bid  . Enthesopathy 07/07/2016  . Esophagitis, reflux 07/07/2016  . Essential hypertension 05/06/2015  . Functional diarrhea 07/07/2016  .  GERD (gastroesophageal reflux disease)   . Headache 07/07/2016  . Heart failure ([place])   . History of diverticulosis 07/07/2016  . HLD (hyperlipidemia)   . HTN (hypertension)   . Hyperlipidemia 05/06/2015  . Hypertensive heart disease with heart failure ([place]) 12/03/2014  . Hypothyroid 07/07/2016  . Hypothyroidism   . Irritable bowel syndrome 07/07/2016   Overview:  IBS information sheet provided,  dietary  changes suggested  . Low back pain 07/07/2016   Overview:  lumbar spine films at [facility]  . Major depressive disorder, recurrent, moderate ([place]) 07/07/2016  . Microalbuminuria due to type 2 diabetes mellitus ([place]) 05/06/2015  . Midline low back pain without sciatica 07/07/2016   Overview:  --RTC for warning signs (Fevers, chills, loss of bowel/bladder function, saddle numbness), worsening pain or failure for pain to improve with symptomatic treatment --reviewed natural course of disease that acute LBP takes 4-6 weeks to recover --cont meds listed for symptomatic relief --follow up in 2-4 weeks or earlier if warning signs develop  . Nerve pain 07/07/2016  . Obesity   . Obesity (BMI 30-39.9) 05/06/2015  . OSA (obstructive sleep apnea) 07/07/2016  . Osteoarthritis 07/07/2016  . Other ventral hernia 07/07/2016  . Overweight 07/07/2016  . Pacemaker 11/21/2014  . PAF (paroxysmal atrial fibrillation) ([place])   . Presence of permanent cardiac pacemaker    09-24-13 01-21-14 Last interrogation in Epic at [place]  . Rhinosinusitis 07/07/2016  . Seasonal allergies 07/07/2016  . Sleep apnea    No cpap used  . Smoker 07/07/2016  . SSS (sick sinus syndrome) ([place]) 11/21/2014  . Symptomatic menopausal or female climacteric states 07/07/2016  . Thoracic back pain 07/07/2016  . Type II or unspecified type diabetes mellitus with renal manifestations, uncontrolled 07/07/2016    Past Surgical History:  Procedure Laterality Date  . BREAST LUMPECTOMY WITH RADIOACTIVE SEED LOCALIZATION Right 08/02/2017   Procedure: BREAST LUMPECTOMY WITH RADIOACTIVE SEED LOCALIZATION;  Surgeon: Erroll Luna, MD;  Location: [facility];  Service: General;  Laterality: Right;  . CARPAL TUNNEL RELEASE    . CHOLECYSTECTOMY    . ORIF ANKLE FRACTURE Left 02/21/2014   Procedure: OPEN REDUCTION INTERNAL FIXATION (ORIF) LEFT  ANKLE FRACTURE;  Surgeon: Tobi Bastos, MD;  Location: [facility];  Service: Orthopedics;  Laterality: Left;  . PACEMAKER  INSERTION  09-24-13  . TOTAL HIP ARTHROPLASTY    . TUBAL LIGATION      Current Medications: Current Meds  Medication Sig  . albuterol (PROVENTIL HFA;VENTOLIN HFA) 108 (90 Base) MCG/ACT inhaler Inhale 2 puffs into the lungs every 6 (six) hours as needed for wheezing or shortness of breath.  Marland Kitchen apixaban (ELIQUIS) 5 MG TABS tablet Take 1 tablet (5 mg total) by mouth 2 (two) times daily.  Marland Kitchen atorvastatin (LIPITOR) 20 MG tablet Take 1 tablet (20 mg total) by mouth daily. (Patient taking differently: Take 20 mg by mouth at bedtime. )  . benazepril (LOTENSIN) 10 MG tablet Take 10 mg by mouth daily.    Marland Kitchen escitalopram (LEXAPRO) 20 MG tablet Take 20 mg by mouth daily.    . Insulin Detemir (LEVEMIR FLEXTOUCH) 100 UNIT/ML Pen Inject 40 Units into the skin at bedtime.   Marland Kitchen levothyroxine (SYNTHROID, LEVOTHROID) 150 MCG tablet Take 150 mcg by mouth daily before breakfast.   . liraglutide (VICTOZA) 18 MG/3ML SOPN INJECT 1.8MG  SUBCUTANEOUSLY EVERY MORNING  . pantoprazole (PROTONIX) 40 MG tablet Take 40 mg by mouth daily.  . SYMBICORT 160-4.5 MCG/ACT inhaler Inhale 2 puffs into the lungs  2 (two) times daily as needed for shortness of breath.  . torsemide (DEMADEX) 20 MG tablet Take 20 mg by mouth daily as needed (for feet swelling).  . Vitamin D, Ergocalciferol, (DRISDOL) 50000 units CAPS capsule Take 50,000 Units by mouth every Sunday.     Allergies:   Meperidine and Hydrocodone-acetaminophen   Social History   Socioeconomic History  . Marital status: Divorced    Spouse name: Not on file  . Number of children: 2  . Years of education: Not on file  . Highest education level: Not on file  Occupational History  . Occupation: Teacher, adult education  Social Needs  . Financial resource strain: Not on file  . Food insecurity:    Worry: Not on file    Inability: Not on file  . Transportation needs:    Medical: Not on file    Non-medical: Not on file  Tobacco Use  . Smoking status: Former Smoker    Years:  2.00    Last attempt to quit: 11/25/2008    Years since quitting: 8.7  . Smokeless tobacco: Never Used  Substance and Sexual Activity  . Alcohol use: No  . Drug use: No  . Sexual activity: Not on file  Lifestyle  . Physical activity:    Days per week: Not on file    Minutes per session: Not on file  . Stress: Not on file  Relationships  . Social connections:    Talks on phone: Not on file    Gets together: Not on file    Attends religious service: Not on file    Active member of club or organization: Not on file    Attends meetings of clubs or organizations: Not on file    Relationship status: Not on file  Other Topics Concern  . Not on file  Social History Narrative  . Not on file     Family History: The patient's family history includes Heart failure in her unknown relative. ROS:   Please see the history of present illness.    All other systems reviewed and are negative.  EKGs/Labs/Other Studies Reviewed:    The following studies were reviewed today:   Recent Labs: 07/26/2017: ALT 37; BUN 13; Creatinine, Ser 1.07; Hemoglobin 13.5; Platelets 240; Potassium 3.4; Sodium 138  Recent Lipid Panel    Component Value Date/Time   CHOL 187 11/22/2016 1041   TRIG 278 (H) 11/22/2016 1041   HDL 45 11/22/2016 1041   CHOLHDL 4.2 11/22/2016 1041   LDLCALC 86 11/22/2016 1041    Physical Exam:    VS:  BP 128/72 (BP Location: Right Arm, Patient Position: Sitting, Cuff Size: Normal)   Pulse 74   Ht 5\' 4"  (1.626 m)   Wt 233 lb (105.7 kg)   LMP 02/26/2005   SpO2 98%   BMI 39.99 kg/m     Wt Readings from Last 3 Encounters:  09/09/17 233 lb (105.7 kg)  07/26/17 236 lb 14.4 oz (107.5 kg)  05/18/17 232 lb (105.2 kg)     GEN:  Well nourished, well developed in no acute distress HEENT: Normal NECK: No JVD; No carotid bruits LYMPHATICS: No lymphadenopathy CARDIAC: RRR, no murmurs, rubs, gallops RESPIRATORY:  Clear to auscultation without rales, wheezing or rhonchi  ABDOMEN:  Soft, non-tender, non-distended MUSCULOSKELETAL:  No edema; No deformity  SKIN: Warm and dry NEUROLOGIC:  Alert and oriented x 3 PSYCHIATRIC:  Normal affect    Signed, Shirlee More, MD  09/09/2017 4:12 PM  [facility]

## 2017-09-09 ENCOUNTER — Encounter: Payer: Self-pay | Admitting: Cardiology

## 2017-09-09 ENCOUNTER — Ambulatory Visit (INDEPENDENT_AMBULATORY_CARE_PROVIDER_SITE_OTHER): Payer: BLUE CROSS/BLUE SHIELD | Admitting: Cardiology

## 2017-09-09 VITALS — BP 128/72 | HR 74 | Ht 64.0 in | Wt 233.0 lb

## 2017-09-09 DIAGNOSIS — Z7901 Long term (current) use of anticoagulants: Secondary | ICD-10-CM | POA: Diagnosis not present

## 2017-09-09 DIAGNOSIS — I110 Hypertensive heart disease with heart failure: Secondary | ICD-10-CM

## 2017-09-09 DIAGNOSIS — Z950 Presence of cardiac pacemaker: Secondary | ICD-10-CM | POA: Diagnosis not present

## 2017-09-09 DIAGNOSIS — I480 Paroxysmal atrial fibrillation: Secondary | ICD-10-CM

## 2017-09-09 NOTE — Patient Instructions (Addendum)

## 2017-09-25 LAB — CUP PACEART REMOTE DEVICE CHECK
Battery Impedance: 161 Ohm
Brady Statistic AP VP Percent: 0 %
Brady Statistic AP VS Percent: 9 %
Brady Statistic AS VP Percent: 0 %
Brady Statistic AS VS Percent: 90 %
Date Time Interrogation Session: 20190724201818
Implantable Lead Implant Date: 20150831
Implantable Lead Location: 753860
Implantable Lead Model: 4092
Implantable Lead Model: 5076
Lead Channel Impedance Value: 616 Ohm
Lead Channel Impedance Value: 624 Ohm
Lead Channel Pacing Threshold Amplitude: 0.625 V
Lead Channel Pacing Threshold Pulse Width: 0.4 ms
Lead Channel Pacing Threshold Pulse Width: 0.4 ms
Lead Channel Setting Pacing Amplitude: 2 V
Lead Channel Setting Pacing Pulse Width: 0.4 ms
Lead Channel Setting Sensing Sensitivity: 2.8 mV
MDC IDC LEAD IMPLANT DT: 20150831
MDC IDC LEAD LOCATION: 753859
MDC IDC MSMT BATTERY REMAINING LONGEVITY: 159 mo
MDC IDC MSMT BATTERY VOLTAGE: 2.8 V
MDC IDC MSMT LEADCHNL RV PACING THRESHOLD AMPLITUDE: 0.625 V
MDC IDC PG IMPLANT DT: 20150831
MDC IDC SET LEADCHNL RA PACING AMPLITUDE: 1.5 V

## 2017-10-24 ENCOUNTER — Other Ambulatory Visit: Payer: Self-pay

## 2017-10-24 ENCOUNTER — Telehealth: Payer: Self-pay

## 2017-10-24 MED ORDER — ATORVASTATIN CALCIUM 20 MG PO TABS: 20.0000 mg | ORAL_TABLET | Freq: Every day | ORAL | 2 refills | Status: DC

## 2017-10-24 MED ORDER — APIXABAN 5 MG PO TABS: 5.0000 mg | ORAL_TABLET | Freq: Two times a day (BID) | ORAL | 2 refills | Status: DC

## 2017-10-24 NOTE — Telephone Encounter (Signed)
Rx sent to pharmacy as requested.

## 2017-11-16 ENCOUNTER — Telehealth: Payer: Self-pay

## 2017-11-16 ENCOUNTER — Ambulatory Visit (INDEPENDENT_AMBULATORY_CARE_PROVIDER_SITE_OTHER): Payer: BLUE CROSS/BLUE SHIELD | Admitting: *Deleted

## 2017-11-16 DIAGNOSIS — I495 Sick sinus syndrome: Secondary | ICD-10-CM

## 2017-11-16 DIAGNOSIS — I10 Essential (primary) hypertension: Secondary | ICD-10-CM

## 2017-11-16 NOTE — Progress Notes (Signed)
Remote pacemaker transmission.   

## 2017-11-16 NOTE — Telephone Encounter (Signed)
LMOVM reminding pt to send remote transmission.   

## 2017-12-08 LAB — CUP PACEART REMOTE DEVICE CHECK
Battery Remaining Longevity: 157 mo
Brady Statistic AP VP Percent: 0 %
Date Time Interrogation Session: 20191023191914
Implantable Lead Implant Date: 20150831
Implantable Lead Location: 753859
Implantable Lead Model: 5076
Implantable Pulse Generator Implant Date: 20150831
Lead Channel Pacing Threshold Amplitude: 0.875 V
Lead Channel Pacing Threshold Pulse Width: 0.4 ms
Lead Channel Sensing Intrinsic Amplitude: 0.7 mV
Lead Channel Setting Pacing Amplitude: 1.75 V
Lead Channel Setting Pacing Amplitude: 2 V
Lead Channel Setting Pacing Pulse Width: 0.4 ms
MDC IDC LEAD IMPLANT DT: 20150831
MDC IDC LEAD LOCATION: 753860
MDC IDC MSMT BATTERY IMPEDANCE: 161 Ohm
MDC IDC MSMT BATTERY VOLTAGE: 2.8 V
MDC IDC MSMT LEADCHNL RA IMPEDANCE VALUE: 586 Ohm
MDC IDC MSMT LEADCHNL RA PACING THRESHOLD PULSEWIDTH: 0.4 ms
MDC IDC MSMT LEADCHNL RV IMPEDANCE VALUE: 582 Ohm
MDC IDC MSMT LEADCHNL RV PACING THRESHOLD AMPLITUDE: 0.75 V
MDC IDC MSMT LEADCHNL RV SENSING INTR AMPL: 5.6 mV
MDC IDC SET LEADCHNL RV SENSING SENSITIVITY: 2 mV
MDC IDC STAT BRADY AP VS PERCENT: 13 %
MDC IDC STAT BRADY AS VP PERCENT: 0 %
MDC IDC STAT BRADY AS VS PERCENT: 86 %

## 2018-02-15 ENCOUNTER — Ambulatory Visit: Payer: BLUE CROSS/BLUE SHIELD

## 2018-02-17 ENCOUNTER — Ambulatory Visit (INDEPENDENT_AMBULATORY_CARE_PROVIDER_SITE_OTHER): Payer: BLUE CROSS/BLUE SHIELD

## 2018-02-17 DIAGNOSIS — I495 Sick sinus syndrome: Secondary | ICD-10-CM | POA: Diagnosis not present

## 2018-02-18 LAB — CUP PACEART REMOTE DEVICE CHECK
Battery Impedance: 185 Ohm
Brady Statistic AP VS Percent: 12 %
Brady Statistic AS VS Percent: 88 %
Date Time Interrogation Session: 20200124190213
Implantable Lead Implant Date: 20150831
Implantable Lead Location: 753859
Lead Channel Impedance Value: 640 Ohm
Lead Channel Pacing Threshold Pulse Width: 0.4 ms
Lead Channel Pacing Threshold Pulse Width: 0.4 ms
Lead Channel Setting Pacing Amplitude: 2 V
MDC IDC LEAD IMPLANT DT: 20150831
MDC IDC LEAD LOCATION: 753860
MDC IDC MSMT BATTERY REMAINING LONGEVITY: 152 mo
MDC IDC MSMT BATTERY VOLTAGE: 2.79 V
MDC IDC MSMT LEADCHNL RA IMPEDANCE VALUE: 639 Ohm
MDC IDC MSMT LEADCHNL RA PACING THRESHOLD AMPLITUDE: 0.875 V
MDC IDC MSMT LEADCHNL RV PACING THRESHOLD AMPLITUDE: 0.75 V
MDC IDC PG IMPLANT DT: 20150831
MDC IDC SET LEADCHNL RA PACING AMPLITUDE: 1.75 V
MDC IDC SET LEADCHNL RV PACING PULSEWIDTH: 0.4 ms
MDC IDC SET LEADCHNL RV SENSING SENSITIVITY: 2 mV
MDC IDC STAT BRADY AP VP PERCENT: 0 %
MDC IDC STAT BRADY AS VP PERCENT: 0 %

## 2018-02-20 NOTE — Progress Notes (Signed)
Remote pacemaker transmission.   

## 2018-03-08 ENCOUNTER — Ambulatory Visit (INDEPENDENT_AMBULATORY_CARE_PROVIDER_SITE_OTHER): Payer: BLUE CROSS/BLUE SHIELD | Admitting: Cardiology

## 2018-03-08 ENCOUNTER — Encounter: Payer: Self-pay | Admitting: Cardiology

## 2018-03-08 VITALS — BP 124/68 | HR 71 | Ht 63.0 in | Wt 233.1 lb

## 2018-03-08 DIAGNOSIS — I110 Hypertensive heart disease with heart failure: Secondary | ICD-10-CM

## 2018-03-08 DIAGNOSIS — I480 Paroxysmal atrial fibrillation: Secondary | ICD-10-CM

## 2018-03-08 DIAGNOSIS — I5032 Chronic diastolic (congestive) heart failure: Secondary | ICD-10-CM

## 2018-03-08 DIAGNOSIS — R079 Chest pain, unspecified: Secondary | ICD-10-CM

## 2018-03-08 DIAGNOSIS — Z950 Presence of cardiac pacemaker: Secondary | ICD-10-CM | POA: Diagnosis not present

## 2018-03-08 DIAGNOSIS — Z01812 Encounter for preprocedural laboratory examination: Secondary | ICD-10-CM

## 2018-03-08 DIAGNOSIS — Z7901 Long term (current) use of anticoagulants: Secondary | ICD-10-CM

## 2018-03-08 NOTE — Progress Notes (Signed)
Cardiology Office Note:    Date:  03/08/2018    ID:  Carolyn [name], DOB [birth year], MRN [id number]  PCP:  Myrlene Broker, MD  Cardiologist:  Shirlee More, MD    Referring MD: Myrlene Broker, MD    ASSESSMENT:    1. Paroxysmal atrial fibrillation (HCC)   2. Chronic anticoagulation   3. Pacemaker   4. Hypertensive heart disease with heart failure ([place])   5. Chronic diastolic heart failure (HCC)   6. Chest pain in adult   7. Pre-procedure lab exam    PLAN:    In order of problems listed above:  1. Stable continue current treatment beta-blocker anticoagulation I would not start antiarrhythmic drug unless she was having frequent symptomatic episodes 2. Continue her current anticoagulant 3. Stable function recent device clinic evaluation reviewed with patient 4. Stable she has no edema continue current diuretic blood pressure target 5. Heart failure stable class I continue her current loop diuretic 6. Concerning she is having nonexertional chest pain at high risk with diabetes hypertension hyperlipidemia atrial fibrillation on undergo cardiac CTA and if she has flow-limiting stenosis consider revascularization   Next appointment: 6 months   Medication Adjustments/Labs and Tests Ordered: Current medicines are reviewed at length with the patient today.  Concerns regarding medicines are outlined above.  Orders Placed This Encounter  Procedures  . CT CORONARY MORPH W/CTA COR W/SCORE W/CA W/CM &/OR WO/CM  . CT CORONARY FRACTIONAL FLOW RESERVE DATA PREP  . CT CORONARY FRACTIONAL FLOW RESERVE FLUID ANALYSIS  . Basic metabolic panel   No orders of the defined types were placed in this encounter.   Chief Complaint  Patient presents with  . Follow-up    History of Present Illness:    Carolyn [name] is a 68 y.o. female with a hx of  paroxysmal atrial fibrillation CHADS2 vasc of 4 , hypertension, diabetes, and history of hyperthyroidism that was treated and she is now  on supplement. She underwent cryo-balloon ablation of atrial fibrillation in December 2017. She has been maintained on Multaq,discontinued in July 2018. She has a dual-chamber pacemaker in placed for sinus node dysfunction   last seen 09/09/17.  Device check 02/17/18:   Normal remote reviewed. AT/AF burden 0.2%. Trend/EGM's appear to be 1:1 SVT/AT with rates 130-150's with longest approximately 61min on 01/16/18 as documented on both and as previously reported. On Eliquis/Metoprolol  Compliance with diet, lifestyle and medications: yes  She had uneventful bilateral hernia repair.  Overall is doing well with no episodes of atrial fibrillation and very little AF burden on device interrogation her heart failure is compensated without edema or shortness of breath and tolerates her anticoagulant without bleeding complication.  She has recurrent episodes of nonexertional left precordial discomfort more sharp than heavy but last for several minutes and is relieved with rest she is at risk for an undergo evaluation of CAD with cardiac CTA Past Medical History:  Diagnosis Date  . Acquired hypothyroidism 07/07/2016  . Acute gastritis 07/07/2016  . Allergic rhinitis 07/07/2016  . Anemia 07/07/2016  . Anxiety 07/07/2016  . Arthritis    Osteoarthritis  . ATRIAL FIBRILLATION 11/27/2008   Qualifier: Diagnosis of  By: Genevie Cheshire PharmD, Gay Filler    . Atypical chest pain 07/07/2016  . Benign essential hypertension 07/07/2016   Overview:  stable on current meds  . Benign paroxysmal positional vertigo 07/07/2016   Overview:  Avoid rapid headmovement and position change. May taper the gabapentin to see if helps.  Marland Kitchen  Bilateral low back pain without sciatica 07/07/2016   Overview:  continue gabapentin and meloxicam,  . Bimalleolar ankle fracture 02/21/2014  . Bronchitis 07/07/2016  . Carpal tunnel syndrome   . CHF (congestive heart failure) ([place])    [id number]  . Chronic anticoagulation 12/03/2014  . Chronic diastolic heart failure  ([place]) 12/03/2014  . CKD (chronic kidney disease) stage 3, GFR 30-59 ml/min (HCC) 11/21/2014  . COPD (chronic obstructive pulmonary disease) ([place]) 07/07/2016  . DM2 (diabetes mellitus, type 2) ([place])   . Edema, peripheral 07/07/2016   Overview:  increase lasix to 40 mg bid  . Enthesopathy 07/07/2016  . Esophagitis, reflux 07/07/2016  . Essential hypertension 05/06/2015  . Functional diarrhea 07/07/2016  . GERD (gastroesophageal reflux disease)   . Headache 07/07/2016  . Heart failure ([place])   . History of diverticulosis 07/07/2016  . HLD (hyperlipidemia)   . HTN (hypertension)   . Hyperlipidemia 05/06/2015  . Hypertensive heart disease with heart failure ([place]) 12/03/2014  . Hypothyroid 07/07/2016  . Hypothyroidism   . Irritable bowel syndrome 07/07/2016   Overview:  IBS information sheet provided,  dietary changes suggested  . Low back pain 07/07/2016   Overview:  lumbar spine films at [facility]  . Major depressive disorder, recurrent, moderate ([place]) 07/07/2016  . Microalbuminuria due to type 2 diabetes mellitus ([place]) 05/06/2015  . Midline low back pain without sciatica 07/07/2016   Overview:  --RTC for warning signs (Fevers, chills, loss of bowel/bladder function, saddle numbness), worsening pain or failure for pain to improve with symptomatic treatment --reviewed natural course of disease that acute LBP takes 4-6 weeks to recover --cont meds listed for symptomatic relief --follow up in 2-4 weeks or earlier if warning signs develop  . Nerve pain 07/07/2016  . Obesity   . Obesity (BMI 30-39.9) 05/06/2015  . OSA (obstructive sleep apnea) 07/07/2016  . Osteoarthritis 07/07/2016  . Other ventral hernia 07/07/2016  . Overweight 07/07/2016  . Pacemaker 11/21/2014  . PAF (paroxysmal atrial fibrillation) ([place])   . Presence of permanent cardiac pacemaker    09-24-13 01-21-14 Last interrogation in Epic at [facility]  . Rhinosinusitis 07/07/2016  . Seasonal allergies 07/07/2016  . Sleep apnea    No cpap used  .  Smoker 07/07/2016  . SSS (sick sinus syndrome) ([place]) 11/21/2014  . Symptomatic menopausal or female climacteric states 07/07/2016  . Thoracic back pain 07/07/2016  . Type II or unspecified type diabetes mellitus with renal manifestations, uncontrolled 07/07/2016    Past Surgical History:  Procedure Laterality Date  . BREAST LUMPECTOMY WITH RADIOACTIVE SEED LOCALIZATION Right 08/02/2017   Procedure: BREAST LUMPECTOMY WITH RADIOACTIVE SEED LOCALIZATION;  Surgeon: Erroll Luna, MD;  Location: [facility];  Service: General;  Laterality: Right;  . CARPAL TUNNEL RELEASE    . CHOLECYSTECTOMY    . ORIF ANKLE FRACTURE Left 02/21/2014   Procedure: OPEN REDUCTION INTERNAL FIXATION (ORIF) LEFT  ANKLE FRACTURE;  Surgeon: Tobi Bastos, MD;  Location: [facility];  Service: Orthopedics;  Laterality: Left;  . PACEMAKER INSERTION  09-24-13  . TOTAL HIP ARTHROPLASTY    . TUBAL LIGATION      Current Medications: Current Meds  Medication Sig  . budesonide-formoterol (SYMBICORT) 160-4.5 MCG/ACT inhaler Inhale into the lungs.  . diphenoxylate-atropine (LOMOTIL) 2.5-0.025 MG tablet Take by mouth.  . fluticasone (FLONASE) 50 MCG/ACT nasal spray SPRAY 1 SPRAY INTO EACH NOSTRIL EVERY DAY  . gabapentin (NEURONTIN) 300 MG capsule TAKE 1 CAPSULE BY MOUTH THREE TIMES A DAY  . glucose  blood (PRECISION QID TEST) test strip 1 strip by Misc.(Non-Drug; Combo Route) route 2 times daily.  . Insulin Pen Needle (FIFTY50 PEN NEEDLES) 32G X 4 MM MISC Use pen needles as directed for diabetic medications  . Vitamin D, Ergocalciferol, (DRISDOL) 1.25 MG (50000 UT) CAPS capsule TAKE ONE CAPSULE BY MOUTH ONE TIME PER WEEK  . [DISCONTINUED] atorvastatin (LIPITOR) 20 MG tablet Take by mouth.     Allergies:   Meperidine and Hydrocodone-acetaminophen   Social History   Socioeconomic History  . Marital status: Divorced    Spouse name: Not on file  . Number of children: 2  . Years of education: Not on file  . Highest education level:  Not on file  Occupational History  . Occupation: Teacher, adult education  Social Needs  . Financial resource strain: Not on file  . Food insecurity:    Worry: Not on file    Inability: Not on file  . Transportation needs:    Medical: Not on file    Non-medical: Not on file  Tobacco Use  . Smoking status: Former Smoker    Years: 2.00    Last attempt to quit: 11/25/2008    Years since quitting: 9.2  . Smokeless tobacco: Never Used  Substance and Sexual Activity  . Alcohol use: No  . Drug use: No  . Sexual activity: Not on file  Lifestyle  . Physical activity:    Days per week: Not on file    Minutes per session: Not on file  . Stress: Not on file  Relationships  . Social connections:    Talks on phone: Not on file    Gets together: Not on file    Attends religious service: Not on file    Active member of club or organization: Not on file    Attends meetings of clubs or organizations: Not on file    Relationship status: Not on file  Other Topics Concern  . Not on file  Social History Narrative  . Not on file     Family History: The patient's family history includes Diabetes in her brother and sister; Heart failure in an other family member. ROS:   Please see the history of present illness.    All other systems reviewed and are negative.  EKGs/Labs/Other Studies Reviewed:    The following studies were reviewed today:  EKG:  EKG ordered today.  The ekg ordered today demonstrates [place] normal  Recent Labs: 07/26/2017: ALT 37; BUN 13; Creatinine, Ser 1.07; Hemoglobin 13.5; Platelets 240; Potassium 3.4; Sodium 138  Recent Lipid Panel   most recent lipid profile 11/22/2016 cholesterol 187 HDL 45 LDL 86    Component Value Date/Time   CHOL 187 11/22/2016 1041   TRIG 278 (H) 11/22/2016 1041   HDL 45 11/22/2016 1041   CHOLHDL 4.2 11/22/2016 1041   LDLCALC 86 11/22/2016 1041    Physical Exam:    VS:  BP 124/68 (BP Location: Left Arm, Patient Position: Sitting, Cuff Size:  Normal)   Pulse 71   Ht 5\' 3"  (1.6 m)   Wt 233 lb 1.3 oz (105.7 kg)   LMP 02/26/2005   SpO2 96%   BMI 41.29 kg/m     Wt Readings from Last 3 Encounters:  03/08/18 233 lb 1.3 oz (105.7 kg)  09/09/17 233 lb (105.7 kg)  07/26/17 236 lb 14.4 oz (107.5 kg)     GEN:  Well nourished, well developed in no acute distress HEENT: Normal NECK: No JVD; No carotid  bruits LYMPHATICS: No lymphadenopathy CARDIAC: RRR, no murmurs, rubs, gallops RESPIRATORY:  Clear to auscultation without rales, wheezing or rhonchi  ABDOMEN: Soft, non-tender, non-distended MUSCULOSKELETAL:  No edema; No deformity  SKIN: Warm and dry NEUROLOGIC:  Alert and oriented x 3 PSYCHIATRIC:  Normal affect    Signed, Shirlee More, MD  03/08/2018 12:15 PM    [facility]

## 2018-03-08 NOTE — Patient Instructions (Addendum)
Medication Instructions:  Your physician recommends that you continue on your current medications as directed. Please refer to the Current Medication list given to you today.  If you need a refill on your cardiac medications before your next appointment, please call your pharmacy.   Lab work: You will have lab work 1 week prior to Cardiac CTA.  Someone will call you once Cardiac CTA has been scheduled to remind you when to return to office for lab work.  No appointment will be needed for lab work.    If you have labs (blood work) drawn today and your tests are completely normal, you will receive your results only by: Marland Kitchen MyChart Message (if you have MyChart) OR . A paper copy in the mail If you have any lab test that is abnormal or we need to change your treatment, we will call you to review the results.  Testing/Procedures: You had an EKG today.  Your physician has requested that you have cardiac CT. Cardiac computed tomography (CT) is a painless test that uses an x-ray machine to take clear, detailed pictures of your heart. For further information please visit [email or web address]tn. Please follow instruction sheet as given.  Please arrive at the [facility] main entrance of [facility] at xx:xx AM (30-45 minutes prior to test start time)  [facility] [place], [place] [address] [phone]  Proceed to the [facility] Radiology Department (First Floor).  Please follow these instructions carefully (unless otherwise directed):   On the Night Before the Test: . Be sure to Drink plenty of water. . Do not consume any caffeinated/decaffeinated beverages or chocolate 12 hours prior to your test. . Do not take any antihistamines 12 hours prior to your test. . ONLY take 1/2 dose of Levemir the night before (22 units)   On the Day of the Test: . Drink plenty of water. Do not drink any water within one hour of the test. . Do not eat any food 4 hours prior  to the test. . You may take your regular medications prior to the test.  . Take metoprolol (Lopressor) two hours prior to test. . HOLD Victoza and torsemide on the morning of the test.                     -If heart rate is less than 55 BPM (beats per minute)- No metoprolol                -If HR is greater than 55 BPM take metoprolol 100mg  ( 2 tablets) on the morning of the test .            After the Test: . Drink plenty of water. . After receiving IV contrast, you may experience a mild flushed feeling. This is normal. . On occasion, you may experience a mild rash up to 24 hours after the test. This is not dangerous. If this occurs, you can take Benadryl 25 mg and increase your fluid intake. . If you experience trouble breathing, this can be serious. If it is severe call 911 IMMEDIATELY. If it is mild, please call our office.     Follow-Up: At [facility], you and your health needs are our priority.  As part of our continuing mission to provide you with exceptional heart care, we have created designated Provider Care Teams.  These Care Teams include your primary Cardiologist (physician) and Advanced Practice Providers (APPs -  Physician Assistants and Nurse  Practitioners) who all work together to provide you with the care you need, when you need it. You will need a follow up appointment in 6 months.  Please call our office 2 months in advance to schedule this appointment.

## 2018-03-09 NOTE — Addendum Note (Signed)
Addended by: Anselm Pancoast on: 03/09/2018 04:26 PM   Modules accepted: Orders

## 2018-03-24 DIAGNOSIS — M25551 Pain in right hip: Secondary | ICD-10-CM | POA: Insufficient documentation

## 2018-03-24 HISTORY — DX: Pain in right hip: M25.551

## 2018-03-28 LAB — BASIC METABOLIC PANEL
BUN/Creatinine Ratio: 22 (ref 12–28)
BUN: 24 mg/dL (ref 8–27)
CALCIUM: 9 mg/dL (ref 8.7–10.3)
CO2: 24 mmol/L (ref 20–29)
CREATININE: 1.09 mg/dL — AB (ref 0.57–1.00)
Chloride: 96 mmol/L (ref 96–106)
GFR, EST AFRICAN AMERICAN: 61 mL/min/{1.73_m2} (ref >59)
GFR, EST NON AFRICAN AMERICAN: 53 mL/min/{1.73_m2} — AB (ref >59)
Glucose: 156 mg/dL — ABNORMAL HIGH (ref 65–99)
Potassium: 4.1 mmol/L (ref 3.5–5.2)
Sodium: 137 mmol/L (ref 134–144)

## 2018-03-29 ENCOUNTER — Telehealth (HOSPITAL_COMMUNITY): Payer: Self-pay | Admitting: Emergency Medicine

## 2018-03-29 NOTE — Telephone Encounter (Signed)
Left message on voicemail with name and callback number Tysheem Accardo RN Navigator Cardiac Imaging [facility] [phone] Office [phone] Cell  

## 2018-03-30 ENCOUNTER — Telehealth (HOSPITAL_COMMUNITY): Payer: Self-pay | Admitting: Emergency Medicine

## 2018-03-30 NOTE — Telephone Encounter (Signed)
Pt returning phone call regarding upcoming cardiac imaging study; pt verbalizes understanding of appt date/time, parking situation and where to check in, pre-test NPO status and medications ordered, and verified current allergies; name and call back number provided for further questions should they arise Marchia Bond RN Navigator Cardiac Imaging [facility] [phone] office [phone] cell  Pt will take 50mg  metoprolol 2 hr prior to scan. PPM with low rate set to 60bpm.

## 2018-03-31 ENCOUNTER — Ambulatory Visit (HOSPITAL_COMMUNITY)
Admission: RE | Admit: 2018-03-31 | Discharge: 2018-03-31 | Disposition: A | Discharge status: Home / Self Care | Payer: BLUE CROSS/BLUE SHIELD | Source: Ambulatory Visit | Attending: Cardiology | Admitting: Cardiology

## 2018-03-31 ENCOUNTER — Ambulatory Visit (HOSPITAL_COMMUNITY): Payer: BLUE CROSS/BLUE SHIELD

## 2018-03-31 DIAGNOSIS — R079 Chest pain, unspecified: Secondary | ICD-10-CM

## 2018-03-31 MED ORDER — IOHEXOL 350 MG/ML SOLN
80.0000 mL | Freq: Once | INTRAVENOUS | Status: AC | PRN
  Administered 2018-03-31: 80 mL via INTRAVENOUS

## 2018-03-31 MED ORDER — NITROGLYCERIN 0.4 MG SL SUBL
0.8000 mg | SUBLINGUAL_TABLET | SUBLINGUAL | Status: DC | PRN
  Administered 2018-03-31: 0.8 mg via SUBLINGUAL
  Filled 2018-03-31: qty 25

## 2018-03-31 MED ORDER — METOPROLOL TARTRATE 5 MG/5ML IV SOLN
5.0000 mg | INTRAVENOUS | Status: DC | PRN
  Filled 2018-03-31: qty 5

## 2018-03-31 MED ORDER — NITROGLYCERIN 0.4 MG SL SUBL
SUBLINGUAL_TABLET | SUBLINGUAL | Status: AC
  Filled 2018-03-31: qty 2

## 2018-04-05 NOTE — Progress Notes (Signed)
Cardiology Office Note:    Date:  04/06/2018   ID:  Carolyn [name], DOB 08-Oct-1950, MRN [id number]  PCP:  Myrlene Broker, MD  Cardiologist:  Shirlee More, MD    Referring MD: Myrlene Broker, MD    ASSESSMENT:    1. Hypertensive heart disease with heart failure ([facility])   2. Mild CAD   3. Hyperlipidemia, unspecified hyperlipidemia type   4. Paroxysmal atrial fibrillation (HCC)   5. Chronic anticoagulation    PLAN:    In order of problems listed above:  1. By cardiac CTA shows mild CAD continues to have exertional chest pain a few times a week and we will go ahead and add oral mononitrate to beta-blocker for antianginal effect.  She is quite reassured by the results of the CTA. 2. Stable compensated blood pressure at target continue her current diuretic and ACE inhibitor 3. Stable continue her current high intensity statin 4. Stable remains in sinus rhythm continue anticoagulant 5. Continue anticoagulant   Next appointment: 6 months   Medication Adjustments/Labs and Tests Ordered: Current medicines are reviewed at length with the patient today.  Concerns regarding medicines are outlined above.  No orders of the defined types were placed in this encounter.  Meds ordered this encounter  Medications  . isosorbide mononitrate (IMDUR) 30 MG 24 hr tablet    Sig: Take one tablet daily in the morning.    Dispense:  90 tablet    Refill:  1    Chief Complaint  Patient presents with  . Follow-up  . Atrial Fibrillation  . Hypertension    History of Present Illness:    Carolyn [name] is a 68 y.o. female with a hx of paroxysmal atrial fibrillation CHADS2 vasc of 4 , hypertension, diabetes, and history of hyperthyroidism that was treated and she is now on supplement. She underwent cryo-balloon ablation of atrial fibrillation in December 2017. She has been maintained on Multaq,discontinued in July 2018. She has a dual-chamber pacemaker in placed for sinus node dysfunction   She was  last seen 02//21/20 with non exertional chest pain. Compliance with diet, lifestyle and medications: yes  Cardiac CTA 03/31/18: IMPRESSION: 1. Coronary calcium score of 138. This was 66 percentile for age and sex matched control. 2. Normal coronary origin with right dominance. 3. Mild non-obstructive CAD. Aggressive risk factor modification is recommended. RCA 0-25% LAD 25-49% 4. Pacemaker wires are seen in the right atrium and right ventricle.  When I saw opportunity sit down the office review her cardiac CTA and were both reassured.  She continues to have chest pain which is more anginal exertional occurs a few times a week and lateral mononitrate strip beta-blocker to intensify treatment.  If unresponsive ranolazine is an option.  She has had no breakthrough atrial fibrillation is set up to be seen by EP and device clinic in April.  She has had no recurrence of atrial fibrillation tolerates her anticoagulant.  Past Medical History:  Diagnosis Date  . Acquired hypothyroidism 07/07/2016  . Acute gastritis 07/07/2016  . Allergic rhinitis 07/07/2016  . Anemia 07/07/2016  . Anxiety 07/07/2016  . Arthritis    Osteoarthritis  . ATRIAL FIBRILLATION 11/27/2008   Qualifier: Diagnosis of  By: Genevie Cheshire PharmD, Gay Filler    . Atypical chest pain 07/07/2016  . Benign essential hypertension 07/07/2016   Overview:  stable on current meds  . Benign paroxysmal positional vertigo 07/07/2016   Overview:  Avoid rapid headmovement and position change. May taper the gabapentin  to see if helps.  . Bilateral low back pain without sciatica 07/07/2016   Overview:  continue gabapentin and meloxicam,  . Bimalleolar ankle fracture 02/21/2014  . Bronchitis 07/07/2016  . Carpal tunnel syndrome   . CHF (congestive heart failure) ([place])    [id number]  . Chronic anticoagulation 12/03/2014  . Chronic diastolic heart failure ([place]) 12/03/2014  . CKD (chronic kidney disease) stage 3, GFR 30-59 ml/min (HCC) 11/21/2014  . COPD  (chronic obstructive pulmonary disease) ([facility]) 07/07/2016  . DM2 (diabetes mellitus, type 2) ([facility])   . Edema, peripheral 07/07/2016   Overview:  increase lasix to 40 mg bid  . Enthesopathy 07/07/2016  . Esophagitis, reflux 07/07/2016  . Essential hypertension 05/06/2015  . Functional diarrhea 07/07/2016  . GERD (gastroesophageal reflux disease)   . Headache 07/07/2016  . Heart failure ([place])   . History of diverticulosis 07/07/2016  . HLD (hyperlipidemia)   . HTN (hypertension)   . Hyperlipidemia 05/06/2015  . Hypertensive heart disease with heart failure ([place]) 12/03/2014  . Hypothyroid 07/07/2016  . Hypothyroidism   . Irritable bowel syndrome 07/07/2016   Overview:  IBS information sheet provided,  dietary changes suggested  . Low back pain 07/07/2016   Overview:  lumbar spine films at [facility]  . Major depressive disorder, recurrent, moderate ([place]) 07/07/2016  . Microalbuminuria due to type 2 diabetes mellitus ([place]) 05/06/2015  . Midline low back pain without sciatica 07/07/2016   Overview:  --RTC for warning signs (Fevers, chills, loss of bowel/bladder function, saddle numbness), worsening pain or failure for pain to improve with symptomatic treatment --reviewed natural course of disease that acute LBP takes 4-6 weeks to recover --cont meds listed for symptomatic relief --follow up in 2-4 weeks or earlier if warning signs develop  . Nerve pain 07/07/2016  . Obesity   . Obesity (BMI 30-39.9) 05/06/2015  . OSA (obstructive sleep apnea) 07/07/2016  . Osteoarthritis 07/07/2016  . Other ventral hernia 07/07/2016  . Overweight 07/07/2016  . Pacemaker 11/21/2014  . PAF (paroxysmal atrial fibrillation) ([place])   . Presence of permanent cardiac pacemaker    09-24-13 01-21-14 Last interrogation in Epic at [facility]  . Rhinosinusitis 07/07/2016  . Seasonal allergies 07/07/2016  . Sleep apnea    No cpap used  . Smoker 07/07/2016  . SSS (sick sinus syndrome) ([place]) 11/21/2014  . Symptomatic menopausal or female  climacteric states 07/07/2016  . Thoracic back pain 07/07/2016  . Type II or unspecified type diabetes mellitus with renal manifestations, uncontrolled 07/07/2016    Past Surgical History:  Procedure Laterality Date  . BREAST LUMPECTOMY WITH RADIOACTIVE SEED LOCALIZATION Right 08/02/2017   Procedure: BREAST LUMPECTOMY WITH RADIOACTIVE SEED LOCALIZATION;  Surgeon: Erroll Luna, MD;  Location: [facility];  Service: General;  Laterality: Right;  . CARPAL TUNNEL RELEASE    . CHOLECYSTECTOMY    . ORIF ANKLE FRACTURE Left 02/21/2014   Procedure: OPEN REDUCTION INTERNAL FIXATION (ORIF) LEFT  ANKLE FRACTURE;  Surgeon: Tobi Bastos, MD;  Location: [facility];  Service: Orthopedics;  Laterality: Left;  . PACEMAKER INSERTION  09-24-13  . TOTAL HIP ARTHROPLASTY    . TUBAL LIGATION      Current Medications: Current Meds  Medication Sig  . albuterol (PROVENTIL HFA;VENTOLIN HFA) 108 (90 Base) MCG/ACT inhaler Inhale 2 puffs into the lungs every 6 (six) hours as needed for wheezing or shortness of breath.  Marland Kitchen apixaban (ELIQUIS) 5 MG TABS tablet Take 1 tablet (5 mg total) by mouth 2 (two) times daily.  Marland Kitchen  atorvastatin (LIPITOR) 20 MG tablet Take 1 tablet (20 mg total) by mouth at bedtime.  . benazepril (LOTENSIN) 10 MG tablet Take 10 mg by mouth daily.    . diphenoxylate-atropine (LOMOTIL) 2.5-0.025 MG tablet Take 1 tablet by mouth 4 (four) times daily as needed.   Marland Kitchen escitalopram (LEXAPRO) 20 MG tablet Take 20 mg by mouth daily.    . fluticasone (FLONASE) 50 MCG/ACT nasal spray SPRAY 1 SPRAY INTO EACH NOSTRIL EVERY DAY  . gabapentin (NEURONTIN) 300 MG capsule TAKE 1 CAPSULE BY MOUTH THREE TIMES A DAY  . glucose blood (PRECISION QID TEST) test strip 1 strip by Misc.(Non-Drug; Combo Route) route 2 times daily.  . Insulin Detemir (LEVEMIR FLEXTOUCH) 100 UNIT/ML Pen Inject 44 Units into the skin at bedtime.   . Insulin Pen Needle (FIFTY50 PEN NEEDLES) 32G X 4 MM MISC Use pen needles as directed for diabetic  medications  . levothyroxine (SYNTHROID, LEVOTHROID) 150 MCG tablet Take 150 mcg by mouth daily before breakfast.   . liraglutide (VICTOZA) 18 MG/3ML SOPN INJECT 1.8MG  SUBCUTANEOUSLY EVERY MORNING  . metoprolol tartrate (LOPRESSOR) 50 MG tablet Take 1 tablet (50 mg total) by mouth 2 (two) times daily.  . pantoprazole (PROTONIX) 40 MG tablet Take 40 mg by mouth daily.  . SYMBICORT 160-4.5 MCG/ACT inhaler Inhale 2 puffs into the lungs 2 (two) times daily as needed for shortness of breath.  . torsemide (DEMADEX) 20 MG tablet Take 20 mg by mouth daily as needed (for feet swelling).  . Vitamin D, Ergocalciferol, (DRISDOL) 1.25 MG (50000 UT) CAPS capsule TAKE ONE CAPSULE BY MOUTH ONE TIME PER WEEK     Allergies:   Meperidine and Hydrocodone-acetaminophen   Social History   Socioeconomic History  . Marital status: Divorced    Spouse name: Not on file  . Number of children: 2  . Years of education: Not on file  . Highest education level: Not on file  Occupational History  . Occupation: Teacher, adult education  Social Needs  . Financial resource strain: Not on file  . Food insecurity:    Worry: Not on file    Inability: Not on file  . Transportation needs:    Medical: Not on file    Non-medical: Not on file  Tobacco Use  . Smoking status: Former Smoker    Years: 2.00    Last attempt to quit: 11/25/2008    Years since quitting: 9.3  . Smokeless tobacco: Never Used  Substance and Sexual Activity  . Alcohol use: No  . Drug use: No  . Sexual activity: Not on file  Lifestyle  . Physical activity:    Days per week: Not on file    Minutes per session: Not on file  . Stress: Not on file  Relationships  . Social connections:    Talks on phone: Not on file    Gets together: Not on file    Attends religious service: Not on file    Active member of club or organization: Not on file    Attends meetings of clubs or organizations: Not on file    Relationship status: Not on file  Other Topics  Concern  . Not on file  Social History Narrative  . Not on file     Family History: The patient's family history includes Diabetes in her brother and sister; Heart failure in an other family member. ROS:   Please see the history of present illness.    All other systems reviewed and are negative.  EKGs/Labs/Other Studies Reviewed:    The following studies were reviewed today:  Recent Labs: 07/26/2017: ALT 37; Hemoglobin 13.5; Platelets 240 03/27/2018: BUN 24; Creatinine, Ser 1.09; Potassium 4.1; Sodium 137  Recent Lipid Panel    Component Value Date/Time   CHOL 187 11/22/2016 1041   TRIG 278 (H) 11/22/2016 1041   HDL 45 11/22/2016 1041   CHOLHDL 4.2 11/22/2016 1041   LDLCALC 86 11/22/2016 1041    Physical Exam:    VS:  BP (!) 86/50 (BP Location: Left Arm, Patient Position: Sitting, Cuff Size: Large)   Pulse 82   Ht 5' 3.5" (1.613 m)   Wt 232 lb 6.4 oz (105.4 kg)   LMP 02/26/2005   SpO2 96%   BMI 40.52 kg/m     Wt Readings from Last 3 Encounters:  04/06/18 232 lb 6.4 oz (105.4 kg)  03/08/18 233 lb 1.3 oz (105.7 kg)  09/09/17 233 lb (105.7 kg)    Repeat BP 115/80  GEN:  Well nourished, well developed in no acute distress HEENT: Normal NECK: No JVD; No carotid bruits LYMPHATICS: No lymphadenopathy CARDIAC: RRR, no murmurs, rubs, gallops RESPIRATORY:  Clear to auscultation without rales, wheezing or rhonchi  ABDOMEN: Soft, non-tender, non-distended MUSCULOSKELETAL:  No edema; No deformity  SKIN: Warm and dry NEUROLOGIC:  Alert and oriented x 3 PSYCHIATRIC:  Normal affect    Signed, Shirlee More, MD  04/06/2018 5:26 PM    [facility]

## 2018-04-06 ENCOUNTER — Ambulatory Visit (INDEPENDENT_AMBULATORY_CARE_PROVIDER_SITE_OTHER): Payer: BLUE CROSS/BLUE SHIELD | Admitting: Cardiology

## 2018-04-06 ENCOUNTER — Encounter: Payer: Self-pay | Admitting: Cardiology

## 2018-04-06 ENCOUNTER — Other Ambulatory Visit: Payer: Self-pay

## 2018-04-06 VITALS — BP 86/50 | HR 82 | Ht 63.5 in | Wt 232.4 lb

## 2018-04-06 DIAGNOSIS — I110 Hypertensive heart disease with heart failure: Secondary | ICD-10-CM | POA: Diagnosis not present

## 2018-04-06 DIAGNOSIS — I2510 Atherosclerotic heart disease of native coronary artery without angina pectoris: Secondary | ICD-10-CM

## 2018-04-06 DIAGNOSIS — I480 Paroxysmal atrial fibrillation: Secondary | ICD-10-CM

## 2018-04-06 DIAGNOSIS — Z7901 Long term (current) use of anticoagulants: Secondary | ICD-10-CM | POA: Diagnosis not present

## 2018-04-06 DIAGNOSIS — E785 Hyperlipidemia, unspecified: Secondary | ICD-10-CM

## 2018-04-06 HISTORY — DX: Atherosclerotic heart disease of native coronary artery without angina pectoris: I25.10

## 2018-04-06 MED ORDER — ISOSORBIDE MONONITRATE ER 30 MG PO TB24: ORAL_TABLET | ORAL | 1 refills | Status: DC

## 2018-04-06 NOTE — Patient Instructions (Signed)
Medication: Your physician has recommended you make the following change in your medication:   START: Imdur (isosorbide) 30mg  one tablet daily in the morning   If you need a refill on your cardiac medications before your next appointment, please call your pharmacy.   Lab work: NONE If you have labs (blood work) drawn today and your tests are completely normal, you will receive your results only by: Marland Kitchen MyChart Message (if you have MyChart) OR . A paper copy in the mail If you have any lab test that is abnormal or we need to change your treatment, we will call you to review the results.  Testing/Procedures: NONE  Follow-Up: At [facility], you and your health needs are our priority.  As part of our continuing mission to provide you with exceptional heart care, we have created designated Provider Care Teams.  These Care Teams include your primary Cardiologist (physician) and Advanced Practice Providers (APPs -  Physician Assistants and Nurse Practitioners) who all work together to provide you with the care you need, when you need it. You will need a follow up appointment in 5 months.

## 2018-05-12 HISTORY — DX: Morbid (severe) obesity due to excess calories: E66.01

## 2018-05-15 ENCOUNTER — Telehealth: Payer: Self-pay | Admitting: *Deleted

## 2018-05-15 NOTE — Telephone Encounter (Signed)
Called patient to let them know due to recent COVID19 CDC and Health Department Protocols, we are not seeing patients in the office. We are instead seeing if they would like to schedule this appointment as a Virtual Appointment VIA Smartphone or Laptop. Unable to reach patient. LVMTCB  

## 2018-05-16 NOTE — Telephone Encounter (Signed)
Follow Up:; ° ° °Returning your call. °

## 2018-05-16 NOTE — Telephone Encounter (Signed)
LVMTCB to go over her virtual visit information

## 2018-05-17 NOTE — Telephone Encounter (Signed)
LMTCB

## 2018-05-18 NOTE — Telephone Encounter (Signed)
Pt agreeable to virtual visit with Dr. Curt Bears Monday 4/27.     Virtual Visit Pre-Appointment Phone Call  "(Name), I am calling you today to discuss your upcoming appointment. We are currently trying to limit exposure to the virus that causes COVID-19 by seeing patients at home rather than in the office."  1. "What is the BEST phone number to call the day of the visit?" - include this in appointment notes  2. "Do you have or have access to (through a family member/friend) a smartphone with video capability that we can use for your visit?" a. If yes - list this number in appt notes as "cell" (if different from BEST phone #) and list the appointment type as a VIDEO visit in appointment notes b. If no - list the appointment type as a PHONE visit in appointment notes  3. Confirm consent - "In the setting of the current Covid19 crisis, you are scheduled for a (phone or video) visit with your provider on (date) at (time).  Just as we do with many in-office visits, in order for you to participate in this visit, we must obtain consent.  If you'd like, I can send this to your mychart (if signed up) or email for you to review.  Otherwise, I can obtain your verbal consent now.  All virtual visits are billed to your insurance company just like a normal visit would be.  By agreeing to a virtual visit, we'd like you to understand that the technology does not allow for your provider to perform an examination, and thus may limit your provider's ability to fully assess your condition. If your provider identifies any concerns that need to be evaluated in person, we will make arrangements to do so.  Finally, though the technology is pretty good, we cannot assure that it will always work on either your or our end, and in the setting of a video visit, we may have to convert it to a phone-only visit.  In either situation, we cannot ensure that we have a secure connection.  Are you willing to proceed?" STAFF: Did the  patient verbally acknowledge consent to telehealth visit? Document YES/NO here: YES  4. Advise patient to be prepared - "Two hours prior to your appointment, go ahead and check your blood pressure, pulse, oxygen saturation, and your weight (if you have the equipment to check those) and write them all down. When your visit starts, your provider will ask you for this information. If you have an Apple Watch or Kardia device, please plan to have heart rate information ready on the day of your appointment. Please have a pen and paper handy nearby the day of the visit as well."  5. Give patient instructions for MyChart download to smartphone OR [email or web address] as below if video visit (depending on what platform provider is using)  6. Inform patient they will receive a phone call 15 minutes prior to their appointment time (may be from unknown caller ID) so they should be prepared to answer    Carolyn [name] has been deemed a candidate for a follow-up tele-health visit to limit community exposure during the Covid-19 pandemic. I spoke with the patient via phone to ensure availability of phone/video source, confirm preferred email & phone number, and discuss instructions and expectations.  I reminded Carolyn [name] to be prepared with any vital sign and/or heart rhythm information that could potentially be obtained via home monitoring, at the time of her visit.  I reminded Carolyn [name] to expect a phone call prior to her visit.  [name], RN 05/18/2018 3:35 PM   INSTRUCTIONS FOR DOWNLOADING THE MYCHART APP TO SMARTPHONE  - The patient must first make sure to have activated MyChart and know their login information - If Apple, go to [employer] and type in MyChart in the search bar and download the app. If Android, ask patient to go to [employer] and type in [facility] in the search bar and download the app. The app is free but as with any other app downloads, their  phone may require them to verify saved payment information or Apple/Android password.  - The patient will need to then log into the app with their MyChart username and password, and select [facility] as their healthcare provider to link the account. When it is time for your visit, go to the MyChart app, find appointments, and click Begin Video Visit. Be sure to Select Allow for your device to access the Microphone and Camera for your visit. You will then be connected, and your provider will be with you shortly.  **If they have any issues connecting, or need assistance please contact MyChart service desk [phone])**  **If using a computer, in order to ensure the best quality for their visit they will need to use either of the following Internet Browsers: [employer], or Google Chrome**  IF USING DOXIMITY or DOXY.ME - The patient will receive a link just prior to their visit by text.     FULL LENGTH CONSENT FOR TELE-HEALTH VISIT   I hereby voluntarily request, consent and authorize [facility] and its employed or contracted physicians, physician assistants, nurse practitioners or other licensed health care professionals (the Practitioner), to provide me with telemedicine health care services (the "Services") as deemed necessary by the treating Practitioner. I acknowledge and consent to receive the Services by the Practitioner via telemedicine. I understand that the telemedicine visit will involve communicating with the Practitioner through live audiovisual communication technology and the disclosure of certain medical information by electronic transmission. I acknowledge that I have been given the opportunity to request an in-person assessment or other available alternative prior to the telemedicine visit and am voluntarily participating in the telemedicine visit.  I understand that I have the right to withhold or withdraw my consent to the use of telemedicine in the course of  my care at any time, without affecting my right to future care or treatment, and that the Practitioner or I may terminate the telemedicine visit at any time. I understand that I have the right to inspect all information obtained and/or recorded in the course of the telemedicine visit and may receive copies of available information for a reasonable fee.  I understand that some of the potential risks of receiving the Services via telemedicine include:  Marland Kitchen Delay or interruption in medical evaluation due to technological equipment failure or disruption; . Information transmitted may not be sufficient (e.g. poor resolution of images) to allow for appropriate medical decision making by the Practitioner; and/or  . In rare instances, security protocols could fail, causing a breach of personal health information.  Furthermore, I acknowledge that it is my responsibility to provide information about my medical history, conditions and care that is complete and accurate to the best of my ability. I acknowledge that Practitioner's advice, recommendations, and/or decision may be based on factors not within their control, such as incomplete or inaccurate data provided by me or  distortions of diagnostic images or specimens that may result from electronic transmissions. I understand that the practice of medicine is not an exact science and that Practitioner makes no warranties or guarantees regarding treatment outcomes. I acknowledge that I will receive a copy of this consent concurrently upon execution via email to the email address I last provided but may also request a printed copy by calling the office of [facility].    I understand that my insurance will be billed for this visit.   I have read or had this consent read to me. . I understand the contents of this consent, which adequately explains the benefits and risks of the Services being provided via telemedicine.  . I have been provided ample opportunity to ask  questions regarding this consent and the Services and have had my questions answered to my satisfaction. . I give my informed consent for the services to be provided through the use of telemedicine in my medical care  By participating in this telemedicine visit I agree to the above.

## 2018-05-18 NOTE — Telephone Encounter (Signed)
Left message on home & cell numbers asking pt to call back today to discuss Monday's OV.

## 2018-05-18 NOTE — Telephone Encounter (Signed)
Follow up   Pt is returning call    Please cal back on cell phone

## 2018-05-22 ENCOUNTER — Other Ambulatory Visit: Payer: Self-pay

## 2018-05-22 ENCOUNTER — Encounter: Payer: Self-pay | Admitting: Cardiology

## 2018-05-22 ENCOUNTER — Encounter: Payer: BLUE CROSS/BLUE SHIELD | Admitting: Cardiology

## 2018-05-22 ENCOUNTER — Ambulatory Visit (INDEPENDENT_AMBULATORY_CARE_PROVIDER_SITE_OTHER): Payer: BLUE CROSS/BLUE SHIELD | Admitting: *Deleted

## 2018-05-22 ENCOUNTER — Telehealth (INDEPENDENT_AMBULATORY_CARE_PROVIDER_SITE_OTHER): Payer: BLUE CROSS/BLUE SHIELD | Admitting: Cardiology

## 2018-05-22 DIAGNOSIS — I480 Paroxysmal atrial fibrillation: Secondary | ICD-10-CM | POA: Diagnosis not present

## 2018-05-22 DIAGNOSIS — I495 Sick sinus syndrome: Secondary | ICD-10-CM

## 2018-05-22 LAB — CUP PACEART REMOTE DEVICE CHECK
Battery Impedance: 185 Ohm
Battery Remaining Longevity: 152 mo
Battery Voltage: 2.8 V
Brady Statistic AP VP Percent: 0 %
Brady Statistic AP VS Percent: 12 %
Brady Statistic AS VP Percent: 0 %
Brady Statistic AS VS Percent: 88 %
Date Time Interrogation Session: 20200427135412
Implantable Lead Implant Date: 20150831
Implantable Lead Implant Date: 20150831
Implantable Lead Location: 753859
Implantable Lead Location: 753860
Implantable Lead Model: 4092
Implantable Lead Model: 5076
Implantable Pulse Generator Implant Date: 20150831
Lead Channel Impedance Value: 550 Ohm
Lead Channel Impedance Value: 578 Ohm
Lead Channel Pacing Threshold Amplitude: 0.5 V
Lead Channel Pacing Threshold Amplitude: 0.75 V
Lead Channel Pacing Threshold Pulse Width: 0.4 ms
Lead Channel Pacing Threshold Pulse Width: 0.4 ms
Lead Channel Setting Pacing Amplitude: 1.5 V
Lead Channel Setting Pacing Amplitude: 2 V
Lead Channel Setting Pacing Pulse Width: 0.4 ms
Lead Channel Setting Sensing Sensitivity: 2 mV

## 2018-05-22 NOTE — Progress Notes (Signed)
Electrophysiology TeleHealth Note   Due to national recommendations of social distancing due to COVID 19, an audio/video telehealth visit is felt to be most appropriate for this patient at this time.  See Epic message for the patient's consent to telehealth for [facility].   Date:  05/22/2018   ID:  Carolyn [name], DOB 01-22-1951, MRN [id number]  Location: patient's home  Provider location: [address], [place]  Evaluation Performed: Follow-up visit  PCP:  Myrlene Broker, MD  Cardiologist:  [facility]  Electrophysiologist:  Dr Curt Bears  Chief Complaint:  Atrial fibrillation  History of Present Illness:    Carolyn [name] is a 68 y.o. female who presents via audio/video conferencing for a telehealth visit today.  Since last being seen in our clinic, the patient reports doing very well.  Today, she denies symptoms of palpitations, chest pain, shortness of breath,  lower extremity edema, dizziness, presyncope, or syncope.  The patient is otherwise without complaint today.  The patient denies symptoms of fevers, chills, cough, or new SOB worrisome for COVID 19.  History of paroxysmal atrial fibrillation, HTN, sick sinus s/p medtronic pacemaker, hyperlipidemia, diastolic HF.  Today, denies symptoms of palpitations, chest pain, shortness of breath, orthopnea, PND, lower extremity edema, claudication, dizziness, presyncope, syncope, bleeding, or neurologic sequela. The patient is tolerating medications without difficulties.  Overall she is feeling well today.  Last Wednesday she had an episode where she felt weak and fatigued.  She took her blood pressure which was high in the 140s.  She says that she had an episode like this in 2015.  She says that her body felt like rubber.  At that time in 2015, she fell and broke her ankle.  She does have a remote interrogation due today.  We will check that to see if there is any rhythm abnormality.  Past Medical History:  Diagnosis  Date  . Acquired hypothyroidism 07/07/2016  . Acute gastritis 07/07/2016  . Allergic rhinitis 07/07/2016  . Anemia 07/07/2016  . Anxiety 07/07/2016  . Arthritis    Osteoarthritis  . ATRIAL FIBRILLATION 11/27/2008   Qualifier: Diagnosis of  By: Genevie Cheshire PharmD, Gay Filler    . Atypical chest pain 07/07/2016  . Benign essential hypertension 07/07/2016   Overview:  stable on current meds  . Benign paroxysmal positional vertigo 07/07/2016   Overview:  Avoid rapid headmovement and position change. May taper the gabapentin to see if helps.  . Bilateral low back pain without sciatica 07/07/2016   Overview:  continue gabapentin and meloxicam,  . Bimalleolar ankle fracture 02/21/2014  . Bronchitis 07/07/2016  . Carpal tunnel syndrome   . CHF (congestive heart failure) ([place])    [id number]  . Chronic anticoagulation 12/03/2014  . Chronic diastolic heart failure ([place]) 12/03/2014  . CKD (chronic kidney disease) stage 3, GFR 30-59 ml/min (HCC) 11/21/2014  . COPD (chronic obstructive pulmonary disease) ([place]) 07/07/2016  . DM2 (diabetes mellitus, type 2) ([place])   . Edema, peripheral 07/07/2016   Overview:  increase lasix to 40 mg bid  . Enthesopathy 07/07/2016  . Esophagitis, reflux 07/07/2016  . Essential hypertension 05/06/2015  . Functional diarrhea 07/07/2016  . GERD (gastroesophageal reflux disease)   . Headache 07/07/2016  . Heart failure ([place])   . History of diverticulosis 07/07/2016  . HLD (hyperlipidemia)   . HTN (hypertension)   . Hyperlipidemia 05/06/2015  . Hypertensive heart disease with heart failure ([place]) 12/03/2014  . Hypothyroid 07/07/2016  . Hypothyroidism   .  Irritable bowel syndrome 07/07/2016   Overview:  IBS information sheet provided,  dietary changes suggested  . Low back pain 07/07/2016   Overview:  l[facility]bar [facility] films at [facility]  . Major depressive disorder, recurrent, moderate ([place]) 07/07/2016  . Microalbuminuria due to type 2 diabetes mellitus ([place]) 05/06/2015  . Midline low back pain without sciatica  07/07/2016   Overview:  --RTC for warning signs (Fevers, chills, loss of bowel/bladder function, saddle numbness), worsening pain or failure for pain to improve with symptomatic treatment --reviewed natural course of disease that acute LBP takes 4-6 weeks to recover --cont meds listed for symptomatic relief --follow up in 2-4 weeks or earlier if warning signs develop  . Nerve pain 07/07/2016  . Obesity   . Obesity (BMI 30-39.9) 05/06/2015  . OSA (obstructive sleep apnea) 07/07/2016  . Osteoarthritis 07/07/2016  . Other ventral hernia 07/07/2016  . Overweight 07/07/2016  . Pacemaker 11/21/2014  . PAF (paroxysmal atrial fibrillation) ([place])   . Presence of permanent cardiac pacemaker    09-24-13 01-21-14 Last interrogation in Epic at [place]  . Rhinosinusitis 07/07/2016  . Seasonal allergies 07/07/2016  . Sleep apnea    No cpap used  . Smoker 07/07/2016  . SSS (sick sinus syndrome) ([place]) 11/21/2014  . Symptomatic menopausal or female climacteric states 07/07/2016  . Thoracic back pain 07/07/2016  . Type II or unspecified type diabetes mellitus with renal manifestations, uncontrolled 07/07/2016    Past Surgical History:  Procedure Laterality Date  . BREAST LUMPECTOMY WITH RADIOACTIVE SEED LOCALIZATION Right 08/02/2017   Procedure: BREAST LUMPECTOMY WITH RADIOACTIVE SEED LOCALIZATION;  Surgeon: Erroll Luna, MD;  Location: [place];  Service: General;  Laterality: Right;  . CARPAL TUNNEL RELEASE    . CHOLECYSTECTOMY    . ORIF ANKLE FRACTURE Left 02/21/2014   Procedure: OPEN REDUCTION INTERNAL FIXATION (ORIF) LEFT  ANKLE FRACTURE;  Surgeon: Tobi Bastos, MD;  Location[facility];  Service: Orthopedics;  Laterality: Left;  . PACEMAKER INSERTION  09-24-13  . TOTAL HIP ARTHROPLASTY    . TUBAL LIGATION      Current Outpatient Medications  Medication Sig Dispense Refill  . albuterol (PROVENTIL HFA;VENTOLIN HFA) 108 (90 Base) MCG/ACT inhaler Inhale 2 puffs into the lungs every 6 (six) hours as  needed for wheezing or shortness of breath.    Marland Kitchen apixaban (ELIQUIS) 5 MG TABS tablet Take 1 tablet (5 mg total) by mouth 2 (two) times daily. 180 tablet 2  . atorvastatin (LIPITOR) 20 MG tablet Take 1 tablet (20 mg total) by mouth at bedtime. 90 tablet 2  . benazepril (LOTENSIN) 10 MG tablet Take 10 mg by mouth daily.      . diphenoxylate-atropine (LOMOTIL) 2.5-0.025 MG tablet Take 1 tablet by mouth 4 (four) times daily as needed.     Marland Kitchen escitalopram (LEXAPRO) 20 MG tablet Take 20 mg by mouth daily.      . fluticasone (FLONASE) 50 MCG/ACT nasal spray SPRAY 1 SPRAY INTO EACH NOSTRIL EVERY DAY    . gabapentin (NEURONTIN) 300 MG capsule TAKE 1 CAPSULE BY MOUTH THREE TIMES A DAY    . glucose blood (PRECISION QID TEST) test strip 1 strip by Misc.(Non-Drug; Combo Route) route 2 times daily.    . Insulin Detemir (LEVEMIR FLEXTOUCH) 100 UNIT/ML Pen Inject 44 Units into the skin at bedtime.     . Insulin Pen Needle (FIFTY50 PEN NEEDLES) 32G X 4 MM MISC Use pen needles as directed for diabetic medications    . isosorbide  mononitrate (IMDUR) 30 MG 24 hr tablet Take one tablet daily in the morning. 90 tablet 1  . levothyroxine (SYNTHROID, LEVOTHROID) 150 MCG tablet Take 150 mcg by mouth daily before breakfast.     . liraglutide (VICTOZA) 18 MG/3ML SOPN INJECT 1.8MG  SUBCUTANEOUSLY EVERY MORNING    . metoprolol tartrate (LOPRESSOR) 50 MG tablet Take 1 tablet (50 mg total) by mouth 2 (two) times daily. 180 tablet 3  . pantoprazole (PROTONIX) 40 MG tablet Take 40 mg by mouth daily.    . SYMBICORT 160-4.5 MCG/ACT inhaler Inhale 2 puffs into the lungs 2 (two) times daily as needed for shortness of breath.  1  . torsemide (DEMADEX) 20 MG tablet Take 20 mg by mouth daily as needed (for feet swelling).    . Vitamin D, Ergocalciferol, (DRISDOL) 1.25 MG (50000 UT) CAPS capsule TAKE ONE CAPSULE BY MOUTH ONE TIME PER WEEK     No current facility-administered medications for this visit.     Allergies:   Meperidine and  Hydrocodone-acetaminophen   Social History:  The patient  reports that she quit smoking about 9 years ago. She quit after 2.00 years of use. She has never used smokeless tobacco. She reports that she does not drink alcohol or use drugs.   Family History:  The patient's  family history includes Diabetes in her brother and sister; Heart failure in an other family member.   ROS:  Please see the history of present illness.   All other systems are personally reviewed and negative.    Exam:    Vital Signs:  LMP 02/26/2005   Well appearing, alert and conversant, regular work of breathing,  good skin color Eyes- anicteric, neuro- grossly intact, skin- no apparent rash or lesions or cyanosis, mouth- oral mucosa is pink   Labs/Other Tests and Data Reviewed:    Recent Labs: 07/26/2017: ALT 37; Hemoglobin 13.5; Platelets 240 03/27/2018: BUN 24; Creatinine, Ser 1.09; Potassium 4.1; Sodium 137   Wt Readings from Last 3 Encounters:  04/06/18 232 lb 6.4 oz (105.4 kg)  03/08/18 233 lb 1.3 oz (105.7 kg)  09/09/17 233 lb (105.7 kg)     Other studies personally reviewed: Additional studies/ records that were reviewed today include: ECG 03/08/18  Review of the above records today demonstrates:  SR, 1dAVB  Last device remote is reviewed from [facility] PDF dated 02/18/18 which reveals normal device function, 0.2% atrial fibrillation   ASSESSMENT & PLAN:    1.  Paroxysmal atrial fibrillation: on metoprolol and eliquis.  Has 0.2% atrial fibrillation noted on most recent device interrogation.  No changes.  This patients CHA2DS2-VASc Score and unadjusted Ischemic Stroke Rate (% per year) is equal to 4.8 % stroke rate/year from a score of 4  Above score calculated as 1 point each if present [CHF, HTN, DM, Vascular=MI/PAD/Aortic Plaque, Age if 65-74, or Female] Above score calculated as 2 points each if present [Age > 75, or Stroke/TIA/TE]  2. Sick sinus syndrome: s/p MDT pacemaker.  Device functioning  appropriately.  3. Hypertension: Blood pressure was elevated on her last check, though this was a couple days ago.  It has been more normal on prior clinic visits.  No changes at this time.  4. Hyperlipidemia: continue statin   COVID 19 screen The patient denies symptoms of COVID 19 at this time.  The importance of social distancing was discussed today.  Follow-up: 1 year Next remote: 05/22/18  Current medicines are reviewed at length with the patient today.   The  patient does not have concerns regarding her medicines.  The following changes were made today:  none  Labs/ tests ordered today include:  No orders of the defined types were placed in this encounter.    Patient Risk:  after full review of this patients clinical status, I feel that they are at moderate risk at this time.  Today, I have spent 12 minutes with the patient with telehealth technology discussing atrial fibrillation, pacemaker.    Signed, Will Meredith Leeds, MD  05/22/2018 8:38 AM     [facility] [address] [place] [address] [phone] (office) [phone] (fax)

## 2018-05-24 ENCOUNTER — Other Ambulatory Visit: Payer: Self-pay

## 2018-05-24 MED ORDER — METOPROLOL TARTRATE 50 MG PO TABS: 50.0000 mg | ORAL_TABLET | Freq: Two times a day (BID) | ORAL | 3 refills | Status: DC

## 2018-05-25 DIAGNOSIS — M79675 Pain in left toe(s): Secondary | ICD-10-CM

## 2018-05-25 HISTORY — DX: Pain in left toe(s): M79.675

## 2018-05-30 NOTE — Progress Notes (Signed)
Remote pacemaker transmission.   

## 2018-08-21 ENCOUNTER — Ambulatory Visit: Payer: BLUE CROSS/BLUE SHIELD | Admitting: *Deleted

## 2018-08-22 ENCOUNTER — Telehealth: Payer: Self-pay

## 2018-08-22 ENCOUNTER — Telehealth: Payer: Self-pay | Admitting: Cardiology

## 2018-08-22 NOTE — Telephone Encounter (Signed)
Left message for patient to remind of missed remote transmission.  

## 2018-08-22 NOTE — Telephone Encounter (Signed)
LVM for patient to reschedule her appt due to [facility] being in [place], not [place]

## 2018-08-23 LAB — CUP PACEART REMOTE DEVICE CHECK
Battery Impedance: 185 Ohm
Battery Remaining Longevity: 152 mo
Battery Voltage: 2.8 V
Brady Statistic AP VP Percent: 0 %
Brady Statistic AP VS Percent: 11 %
Brady Statistic AS VP Percent: 0 %
Brady Statistic AS VS Percent: 88 %
Date Time Interrogation Session: 20200728215740
Implantable Lead Implant Date: 20150831
Implantable Lead Implant Date: 20150831
Implantable Lead Location: 753859
Implantable Lead Location: 753860
Implantable Lead Model: 4092
Implantable Lead Model: 5076
Implantable Pulse Generator Implant Date: 20150831
Lead Channel Impedance Value: 518 Ohm
Lead Channel Impedance Value: 541 Ohm
Lead Channel Pacing Threshold Amplitude: 0.625 V
Lead Channel Pacing Threshold Amplitude: 0.75 V
Lead Channel Pacing Threshold Pulse Width: 0.4 ms
Lead Channel Pacing Threshold Pulse Width: 0.4 ms
Lead Channel Setting Pacing Amplitude: 1.5 V
Lead Channel Setting Pacing Amplitude: 2 V
Lead Channel Setting Pacing Pulse Width: 0.4 ms
Lead Channel Setting Sensing Sensitivity: 2 mV

## 2018-08-25 ENCOUNTER — Telehealth: Payer: Self-pay | Admitting: *Deleted

## 2018-08-25 MED ORDER — METOPROLOL TARTRATE 100 MG PO TABS: 100.0000 mg | ORAL_TABLET | Freq: Two times a day (BID) | ORAL | 3 refills | Status: DC

## 2018-08-25 NOTE — Telephone Encounter (Signed)
-----   Message from Will Meredith Leeds, MD sent at 08/23/2018  4:54 PM EDT ----- Abnormal device interrogation reviewed.  Lead parameters and battery status stable.  Af with RVR seen. Increase metoprolol to 100 mg BID

## 2018-08-25 NOTE — Telephone Encounter (Signed)
Pt has been notified of device check recommendations. Pt is agreeable to increase Metoprolol tart to 100 mg BID. Pt said she will double up her 50 mg tablets first. I advised when she is down to her last week of the 50 mg tablets call the office and we will send in the new dosage. Pt thanked me for the call and my help. I have updated med list to show increase Metoprolol Tart 100 mg BID.  The patient has been notified of the result and verbalized understanding.  All questions (if any) were answered. Julaine Hua, [facility] 08/25/2018 5:24 PM

## 2018-09-06 NOTE — Progress Notes (Signed)
Cardiology Office Note:    Date:  09/07/2018   ID:  CORETTA LEISEY, DOB Sep 10, 1950, MRN [id number]  PCP:  Myrlene Broker, MD  Cardiologist:  Shirlee More, MD    Referring MD: Myrlene Broker, MD    ASSESSMENT:    1. Paroxysmal atrial fibrillation (HCC)   2. Chronic anticoagulation   3. SSS (sick sinus syndrome) ([place])   4. Pacemaker   5. Hypertensive heart disease with heart failure ([place])   6. Mild CAD   7. Mixed hyperlipidemia   8. Acquired hypothyroidism   9. Chronic diastolic heart failure (HCC)    PLAN:    In order of problems listed above:  1. Mild CAD- stable continue medical therapy including anticoagulant lipid-lowering 2. Hypertensive heart disease with diastolic heart failure- BP at target heart failure compensated class I continue current treatment including diuretic beta-blocker ACE inhibitor 3. Hyperlipidemia- last check 02/2017 with total cholesterol 164, LDL 84, HDL 41, triglycerides 220.  Continue statin check liver function safety lipid profile efficacy 4. PAF- status post cryoablation by EP.  0.5% atrial fibrillation noted on most recent device interrogation, with the paucity of atrial fibrillation at home antiarrhythmic drug like amiodarone or dofetilide at this time. 5. Chronic anticoagulation- secondary to history of PAF.  Moderate stroke risk continue anticoagulant 6. PPM- placed for sinus node dysfunction.  Follows with device clinic and Dr. Curt Bears.   Next appointment: 6 mos   Medication Adjustments/Labs and Tests Ordered: Current medicines are reviewed at length with the patient today.  Concerns regarding medicines are outlined above.  No orders of the defined types were placed in this encounter.  No orders of the defined types were placed in this encounter.   Chief Complaint  Patient presents with   Follow-up   Congestive Heart Failure   Atrial Fibrillation   Coronary Artery Disease    History of Present Illness:    HAILEY STORMER is a 68 y.o. female with a hx of PAF chads vascular 4 (cryoablation Dec 2017, maintained on Multaq until July 2018), HTN, DM 2, history of hyperthyroidism that was treated now on suppressant therapy, dual-chamber PPM for sinus node dysfunction.  Last seen 04/06/18. Her last device check 08/22/2018 showed atrial fibrillation with burden of 0.2% beta-blocker dose was increased by EP group. Compliance with diet, lifestyle and medications: Yes  She has had one clinical event of atrial fibrillation and is tolerating the higher dose of beta-blocker takes her diuretic as needed no complaints of edema shortness of breath chest pain palpitation fatigue and tolerates anticoagulant without bleeding overdue for labs and will check today including a CBC anticoagulated thyroids on thyroid supplement liver function and lipids on a statin. Past Medical History:  Diagnosis Date   Acquired hypothyroidism 07/07/2016   Acute gastritis 07/07/2016   Allergic rhinitis 07/07/2016   Anemia 07/07/2016   Anxiety 07/07/2016   Arthritis    Osteoarthritis   ATRIAL FIBRILLATION 11/27/2008   Qualifier: Diagnosis of  By: Genevie Cheshire PharmD, Sally     Atypical chest pain 07/07/2016   Benign essential hypertension 07/07/2016   Overview:  stable on current meds   Benign paroxysmal positional vertigo 07/07/2016   Overview:  Avoid rapid headmovement and position change. May taper the gabapentin to see if helps.   Bilateral low back pain without sciatica 07/07/2016   Overview:  continue gabapentin and meloxicam,   Bimalleolar ankle fracture 02/21/2014   Bronchitis 07/07/2016   Carpal tunnel syndrome    CHF (congestive  heart failure) ([place])    08-2013   Chronic anticoagulation 12/03/2014   Chronic diastolic heart failure ([place]) 12/03/2014   CKD (chronic kidney disease) stage 3, GFR 30-59 ml/min (HCC) 11/21/2014   COPD (chronic obstructive pulmonary disease) ([place]) 07/07/2016   DM2 (diabetes mellitus, type 2) (HCC)     Edema, peripheral 07/07/2016   Overview:  increase lasix to 40 mg bid   Enthesopathy 07/07/2016   Esophagitis, reflux 07/07/2016   Essential hypertension 05/06/2015   Functional diarrhea 07/07/2016   GERD (gastroesophageal reflux disease)    Headache 07/07/2016   Heart failure ([place])    History of diverticulosis 07/07/2016   HLD (hyperlipidemia)    HTN (hypertension)    Hyperlipidemia 05/06/2015   Hypertensive heart disease with heart failure ([place]) 12/03/2014   Hypothyroid 07/07/2016   Hypothyroidism    Irritable bowel syndrome 07/07/2016   Overview:  IBS information sheet provided,  dietary changes suggested   Low back pain 07/07/2016   Overview:  lumbar spine films at [facility]   Major depressive disorder, recurrent, moderate ([place]) 07/07/2016   Microalbuminuria due to type 2 diabetes mellitus ([place]) 05/06/2015   Midline low back pain without sciatica 07/07/2016   Overview:  --RTC for warning signs (Fevers, chills, loss of bowel/bladder function, saddle numbness), worsening pain or failure for pain to improve with symptomatic treatment --reviewed natural course of disease that acute LBP takes 4-6 weeks to recover --cont meds listed for symptomatic relief --follow up in 2-4 weeks or earlier if warning signs develop   Nerve pain 07/07/2016   Obesity    Obesity (BMI 30-39.9) 05/06/2015   OSA (obstructive sleep apnea) 07/07/2016   Osteoarthritis 07/07/2016   Other ventral hernia 07/07/2016   Overweight 07/07/2016   Pacemaker 11/21/2014   PAF (paroxysmal atrial fibrillation) ([place])    Presence of permanent cardiac pacemaker    09-24-13 01-21-14 Last interrogation in Epic at [facility]   Rhinosinusitis 07/07/2016   Seasonal allergies 07/07/2016   Sleep apnea    No cpap used   Smoker 07/07/2016   SSS (sick sinus syndrome) ([place]) 11/21/2014   Symptomatic menopausal or female climacteric states 07/07/2016   Thoracic back pain 07/07/2016   Type II or unspecified type diabetes  mellitus with renal manifestations, uncontrolled 07/07/2016    Past Surgical History:  Procedure Laterality Date   BREAST LUMPECTOMY WITH RADIOACTIVE SEED LOCALIZATION Right 08/02/2017   Procedure: BREAST LUMPECTOMY WITH RADIOACTIVE SEED LOCALIZATION;  Surgeon: Erroll Luna, MD;  Location: [place];  Service: General;  Laterality: Right;   CARPAL TUNNEL RELEASE     CHOLECYSTECTOMY     HERNIA REPAIR     ORIF ANKLE FRACTURE Left 02/21/2014   Procedure: OPEN REDUCTION INTERNAL FIXATION (ORIF) LEFT  ANKLE FRACTURE;  Surgeon: Tobi Bastos, MD;  [facility];  Service: Orthopedics;  Laterality: Left;   PACEMAKER INSERTION  09-24-13   TOTAL HIP ARTHROPLASTY     TUBAL LIGATION      Current Medications: Current Meds  Medication Sig   albuterol (PROVENTIL HFA;VENTOLIN HFA) 108 (90 Base) MCG/ACT inhaler Inhale 2 puffs into the lungs every 6 (six) hours as needed for wheezing or shortness of breath.   apixaban (ELIQUIS) 5 MG TABS tablet Take 1 tablet (5 mg total) by mouth 2 (two) times daily.   atorvastatin (LIPITOR) 20 MG tablet Take 1 tablet (20 mg total) by mouth at bedtime.   benazepril (LOTENSIN) 10 MG tablet Take 10 mg by mouth daily.     diphenoxylate-atropine (LOMOTIL)  2.5-0.025 MG tablet Take 1 tablet by mouth 4 (four) times daily as needed.    escitalopram (LEXAPRO) 20 MG tablet Take 20 mg by mouth daily.     fluticasone (FLONASE) 50 MCG/ACT nasal spray SPRAY 1 SPRAY INTO EACH NOSTRIL EVERY DAY   glucose blood (PRECISION QID TEST) test strip 1 strip by Misc.(Non-Drug; Combo Route) route 2 times daily.   Insulin Detemir (LEVEMIR FLEXTOUCH) 100 UNIT/ML Pen Inject 44 Units into the skin at bedtime.    Insulin Pen Needle (FIFTY50 PEN NEEDLES) 32G X 4 MM MISC Use pen needles as directed for diabetic medications   isosorbide mononitrate (IMDUR) 30 MG 24 hr tablet Take one tablet daily in the morning.   levothyroxine (SYNTHROID, LEVOTHROID) 150 MCG tablet Take 150 mcg by  mouth daily before breakfast.    liraglutide (VICTOZA) 18 MG/3ML SOPN INJECT 1.8MG  SUBCUTANEOUSLY EVERY MORNING   metoprolol tartrate (LOPRESSOR) 100 MG tablet Take 1 tablet (100 mg total) by mouth 2 (two) times daily.   pantoprazole (PROTONIX) 40 MG tablet Take 40 mg by mouth daily.   SYMBICORT 160-4.5 MCG/ACT inhaler Inhale 2 puffs into the lungs 2 (two) times daily as needed for shortness of breath.   torsemide (DEMADEX) 20 MG tablet Take 20 mg by mouth daily as needed (for feet swelling).   Vitamin D, Ergocalciferol, (DRISDOL) 1.25 MG (50000 UT) CAPS capsule TAKE ONE CAPSULE BY MOUTH ONE TIME PER WEEK     Allergies:   Meperidine and Hydrocodone-acetaminophen   Social History   Socioeconomic History   Marital status: Divorced    Spouse name: Not on file   Number of children: 2   Years of education: Not on file   Highest education level: Not on file  Occupational History   Occupation: [employer]  Social Needs   Financial resource strain: Not on file   Food insecurity    Worry: Not on file    Inability: Not on file   Transportation needs    Medical: Not on file    Non-medical: Not on file  Tobacco Use   Smoking status: Former Smoker    Years: 2.00    Quit date: 11/25/2008    Years since quitting: 9.7   Smokeless tobacco: Never Used  Substance and Sexual Activity   Alcohol use: No   Drug use: No   Sexual activity: Not on file  Lifestyle   Physical activity    Days per week: Not on file    Minutes per session: Not on file   Stress: Not on file  Relationships   Social connections    Talks on phone: Not on file    Gets together: Not on file    Attends religious service: Not on file    Active member of club or organization: Not on file    Attends meetings of clubs or organizations: Not on file    Relationship status: Not on file  Other Topics Concern   Not on file  Social History Narrative   Not on file     Family History: The  patient's family history includes Diabetes in her brother and sister; Heart failure in an other family member. ROS:   Please see the history of present illness.    All other systems reviewed and are negative.  EKGs/Labs/Other Studies Reviewed:    The following studies were reviewed today: Cardiac CTA 03/31/18: IMPRESSION: 1. Coronary calcium score of 138. This was 12 percentile for age and sex matched control. 2. Normal  coronary origin with right dominance. 3. Mild non-obstructive CAD. Aggressive risk factor modification is recommended. RCA 0-25% LAD 25-49% 4. Pacemaker wires are seen in the right atrium and right ventricle.  EKG:  EKG ordered today and personally reviewed.  The ekg ordered today demonstrates [facility] and normal  Recent Labs: 03/27/2018: BUN 24; Creatinine, Ser 1.09; Potassium 4.1; Sodium 137  01/05/18 Hgb 14.5 08/17/17 TSH L 0.256, T4 Normal 1.2  Recent Lipid Panel    Component Value Date/Time   CHOL 187 11/22/2016 1041   TRIG 278 (H) 11/22/2016 1041   HDL 45 11/22/2016 1041   CHOLHDL 4.2 11/22/2016 1041   LDLCALC 86 11/22/2016 1041    Physical Exam:    VS:  Wt 230 lb 12.8 oz (104.7 kg)    LMP 02/26/2005    BMI 40.24 kg/m     Wt Readings from Last 3 Encounters:  09/07/18 230 lb 12.8 oz (104.7 kg)  04/06/18 232 lb 6.4 oz (105.4 kg)  03/08/18 233 lb 1.3 oz (105.7 kg)     GEN:  Well nourished, well developed in no acute distress HEENT: Normal NECK: No JVD; No carotid bruits LYMPHATICS: No lymphadenopathy CARDIAC: RRR, no murmurs, rubs, gallops RESPIRATORY:  Clear to auscultation without rales, wheezing or rhonchi  ABDOMEN: Soft, non-tender, non-distended MUSCULOSKELETAL:  No edema; No deformity  SKIN: Warm and dry NEUROLOGIC:  Alert and oriented x 3 PSYCHIATRIC:  Normal affect    Signed, Shirlee More, MD  09/07/2018 8:23 AM    [place]

## 2018-09-07 ENCOUNTER — Ambulatory Visit (INDEPENDENT_AMBULATORY_CARE_PROVIDER_SITE_OTHER): Payer: BC Managed Care – PPO | Admitting: Cardiology

## 2018-09-07 ENCOUNTER — Encounter: Payer: Self-pay | Admitting: Cardiology

## 2018-09-07 ENCOUNTER — Other Ambulatory Visit: Payer: Self-pay

## 2018-09-07 VITALS — BP 114/78 | HR 63 | Ht 63.5 in | Wt 230.8 lb

## 2018-09-07 DIAGNOSIS — Z7901 Long term (current) use of anticoagulants: Secondary | ICD-10-CM | POA: Diagnosis not present

## 2018-09-07 DIAGNOSIS — E782 Mixed hyperlipidemia: Secondary | ICD-10-CM

## 2018-09-07 DIAGNOSIS — I495 Sick sinus syndrome: Secondary | ICD-10-CM

## 2018-09-07 DIAGNOSIS — I480 Paroxysmal atrial fibrillation: Secondary | ICD-10-CM

## 2018-09-07 DIAGNOSIS — Z950 Presence of cardiac pacemaker: Secondary | ICD-10-CM | POA: Diagnosis not present

## 2018-09-07 DIAGNOSIS — I110 Hypertensive heart disease with heart failure: Secondary | ICD-10-CM | POA: Diagnosis not present

## 2018-09-07 DIAGNOSIS — E039 Hypothyroidism, unspecified: Secondary | ICD-10-CM

## 2018-09-07 DIAGNOSIS — I5032 Chronic diastolic (congestive) heart failure: Secondary | ICD-10-CM

## 2018-09-07 MED ORDER — BENAZEPRIL HCL 10 MG PO TABS: 10.0000 mg | ORAL_TABLET | Freq: Every day | ORAL | 1 refills | Status: DC

## 2018-09-07 NOTE — Patient Instructions (Addendum)
Medication Instructions:  No changes to your medications today.   If you need a refill on your cardiac medications before your next appointment, please call your pharmacy.   Lab work: Your physician recommends that you return for lab work today: CBC, CMP, TSH, T3, T4, lipid panel.  This will check your blood counts, kidneys, liver, electrolytes, and thyroid function.  If you have labs (blood work) drawn today and your tests are completely normal, you will receive your results only by: Marland Kitchen MyChart Message (if you have MyChart) OR . A paper copy in the mail If you have any lab test that is abnormal or we need to change your treatment, we will call you to review the results.  Testing/Procedures: You had an EKG today.   Follow-Up: At [facility], you and your health needs are our priority.  As part of our continuing mission to provide you with exceptional heart care, we have created designated Provider Care Teams.  These Care Teams include your primary Cardiologist (physician) and Advanced Practice Providers (APPs -  Physician Assistants and Nurse Practitioners) who all work together to provide you with the care you need, when you need it. You will need a follow up appointment in 6 months.  Please call our office 2 months in advance to schedule this appointment.   Any Other Special Instructions Will Be Listed Below (If Applicable). It was a pleasure to see you today. Please don't hesitate to call our office if you have questions or concerns!

## 2018-09-08 LAB — COMPREHENSIVE METABOLIC PANEL
ALT: 25 IU/L (ref 0–32)
AST: 22 IU/L (ref 0–40)
Albumin/Globulin Ratio: 2.1 (ref 1.2–2.2)
Albumin: 4.2 g/dL (ref 3.8–4.8)
Alkaline Phosphatase: 89 IU/L (ref 39–117)
BUN/Creatinine Ratio: 16 (ref 12–28)
BUN: 15 mg/dL (ref 8–27)
Bilirubin Total: 1.1 mg/dL (ref 0.0–1.2)
CO2: 21 mmol/L (ref 20–29)
Calcium: 9.3 mg/dL (ref 8.7–10.3)
Chloride: 103 mmol/L (ref 96–106)
Creatinine, Ser: 0.92 mg/dL (ref 0.57–1.00)
GFR calc Af Amer: 75 mL/min/{1.73_m2} (ref >59)
GFR calc non Af Amer: 65 mL/min/{1.73_m2} (ref >59)
Globulin, Total: 2 g/dL (ref 1.5–4.5)
Glucose: 227 mg/dL — ABNORMAL HIGH (ref 65–99)
Potassium: 4.2 mmol/L (ref 3.5–5.2)
Sodium: 139 mmol/L (ref 134–144)
Total Protein: 6.2 g/dL (ref 6.0–8.5)

## 2018-09-08 LAB — CBC
Hematocrit: 40.8 % (ref 34.0–46.6)
Hemoglobin: 13.2 g/dL (ref 11.1–15.9)
MCH: 30.2 pg (ref 26.6–33.0)
MCHC: 32.4 g/dL (ref 31.5–35.7)
MCV: 93 fL (ref 79–97)
Platelets: 232 10*3/uL (ref 150–450)
RBC: 4.37 x10E6/uL (ref 3.77–5.28)
RDW: 13.2 % (ref 11.7–15.4)
WBC: 8.4 10*3/uL (ref 3.4–10.8)

## 2018-09-08 LAB — TSH+T4F+T3FREE
Free T4: 1.01 ng/dL (ref 0.82–1.77)
T3, Free: 1.8 pg/mL — ABNORMAL LOW (ref 2.0–4.4)
TSH: 5.94 u[IU]/mL — ABNORMAL HIGH (ref 0.450–4.500)

## 2018-09-08 LAB — LIPID PANEL
Chol/HDL Ratio: 4.8 ratio — ABNORMAL HIGH (ref 0.0–4.4)
Cholesterol, Total: 150 mg/dL (ref 100–199)
HDL: 31 mg/dL — ABNORMAL LOW (ref >39)
Triglycerides: 427 mg/dL — ABNORMAL HIGH (ref 0–149)

## 2018-09-10 ENCOUNTER — Other Ambulatory Visit: Payer: Self-pay | Admitting: Cardiology

## 2018-09-11 ENCOUNTER — Other Ambulatory Visit: Payer: Self-pay

## 2018-09-11 ENCOUNTER — Telehealth: Payer: Self-pay | Admitting: Cardiology

## 2018-09-11 MED ORDER — ATORVASTATIN CALCIUM 20 MG PO TABS: 20.0000 mg | ORAL_TABLET | Freq: Every day | ORAL | 2 refills | Status: DC

## 2018-09-11 NOTE — Telephone Encounter (Signed)
°*  STAT* If patient is at the pharmacy, call can be transferred to refill team.   1. Which medications need to be refilled? (please list name of each medication and dose if known) atorvastatin (LIPITOR) 20 MG tablet   2. Which pharmacy/location (including street and city if local pharmacy) is medication to be sent to?  [id number] - [place], [place] [phone] (Phone) [phone] (Fax)    3. Do they need a 30 day or 90 day supply? 90 day

## 2018-09-11 NOTE — Telephone Encounter (Signed)
Patient is returning your call.  

## 2018-09-12 MED ORDER — OMEGA-3-ACID ETHYL ESTERS 1 G PO CAPS: 2.0000 g | ORAL_CAPSULE | Freq: Two times a day (BID) | ORAL | 3 refills | Status: DC

## 2018-09-12 NOTE — Addendum Note (Signed)
Addended by: Austin Miles on: 09/12/2018 01:01 PM   Modules accepted: Orders

## 2018-09-12 NOTE — Telephone Encounter (Signed)
Patient's daughter, Lynelle Smoke, per DPR informed of lab results and advised for patient to start lovaza 1 gram 2 capsules twice daily. Tammy is agreeable. [facility] [place] [facility]. No further questions.

## 2018-09-12 NOTE — Telephone Encounter (Signed)
Left message for patient to return call for results. Will continue efforts. 

## 2018-10-19 ENCOUNTER — Other Ambulatory Visit: Payer: Self-pay | Admitting: Cardiology

## 2018-11-14 ENCOUNTER — Other Ambulatory Visit: Payer: Self-pay | Admitting: Cardiology

## 2018-11-14 MED ORDER — APIXABAN 5 MG PO TABS: 5.0000 mg | ORAL_TABLET | Freq: Two times a day (BID) | ORAL | 1 refills | Status: DC

## 2018-11-14 NOTE — Telephone Encounter (Signed)
°*  STAT* If patient is at the pharmacy, call can be transferred to refill team.   1. Which medications need to be refilled? (please list name of each medication and dose if known) Eliquis 5mg  tablet  2. Which pharmacy/location (including street and city if local pharmacy) is medication to [facility] sent to?[id number]  3. Do they need a 30 day or 90 day supply? [place]

## 2018-11-14 NOTE — Telephone Encounter (Signed)
Rx for Eliquis sent to pharmacy as requested.

## 2019-01-01 ENCOUNTER — Telehealth: Payer: Self-pay | Admitting: Cardiology

## 2019-01-01 NOTE — Telephone Encounter (Signed)
New Message  Patient is calling and wants to know when was the last time she has her device checked and if she needs to schedule another appt.  Please call to discuss

## 2019-01-01 NOTE — Telephone Encounter (Signed)
Attempted x2 to reach patient. No answer, unable to LM.  Patient is overdue for remote transmission ([employer]) as of 10/2018. She is not due to see Dr. Curt Bears until 04/2019 (per recall).

## 2019-01-02 ENCOUNTER — Ambulatory Visit (INDEPENDENT_AMBULATORY_CARE_PROVIDER_SITE_OTHER): Payer: BC Managed Care – PPO | Admitting: *Deleted

## 2019-01-02 DIAGNOSIS — Z950 Presence of cardiac pacemaker: Secondary | ICD-10-CM | POA: Diagnosis not present

## 2019-01-02 LAB — CUP PACEART REMOTE DEVICE CHECK
Date Time Interrogation Session: 20201208113707
Implantable Lead Implant Date: 20150831
Implantable Lead Implant Date: 20150831
Implantable Lead Location: 753859
Implantable Lead Location: 753860
Implantable Lead Model: 4092
Implantable Lead Model: 5076
Implantable Pulse Generator Implant Date: 20150831

## 2019-01-02 NOTE — Telephone Encounter (Signed)
I spoke with the pt and let her know we need a transmission with her home remote monitor. I also told her she do not need to see Dr. Curt Bears until 04/2019. The pt agreed to send a transmission Today.

## 2019-01-03 NOTE — Telephone Encounter (Signed)
Transmission was received and processed on 01/02/19.

## 2019-02-06 NOTE — Progress Notes (Signed)
PPM remote 

## 2019-03-05 ENCOUNTER — Other Ambulatory Visit: Payer: Self-pay | Admitting: Cardiology

## 2019-03-17 DIAGNOSIS — Z0000 Encounter for general adult medical examination without abnormal findings: Secondary | ICD-10-CM

## 2019-03-17 DIAGNOSIS — E782 Mixed hyperlipidemia: Secondary | ICD-10-CM

## 2019-03-17 HISTORY — DX: Encounter for general adult medical examination without abnormal findings: Z00.00

## 2019-03-17 HISTORY — DX: Mixed hyperlipidemia: E78.2

## 2019-03-29 ENCOUNTER — Other Ambulatory Visit: Payer: Self-pay | Admitting: Cardiology

## 2019-04-09 ENCOUNTER — Ambulatory Visit (INDEPENDENT_AMBULATORY_CARE_PROVIDER_SITE_OTHER): Payer: BC Managed Care – PPO | Admitting: *Deleted

## 2019-04-09 DIAGNOSIS — Z950 Presence of cardiac pacemaker: Secondary | ICD-10-CM | POA: Diagnosis not present

## 2019-04-09 LAB — CUP PACEART REMOTE DEVICE CHECK
Battery Impedance: 258 Ohm
Battery Remaining Longevity: 139 mo
Battery Voltage: 2.79 V
Brady Statistic AP VP Percent: 0 %
Brady Statistic AP VS Percent: 12 %
Brady Statistic AS VP Percent: 0 %
Brady Statistic AS VS Percent: 87 %
Date Time Interrogation Session: 20210315141031
Implantable Lead Implant Date: 20150831
Implantable Lead Implant Date: 20150831
Implantable Lead Location: 753859
Implantable Lead Location: 753860
Implantable Lead Model: 4092
Implantable Lead Model: 5076
Implantable Pulse Generator Implant Date: 20150831
Lead Channel Impedance Value: 568 Ohm
Lead Channel Impedance Value: 603 Ohm
Lead Channel Pacing Threshold Amplitude: 0.625 V
Lead Channel Pacing Threshold Amplitude: 0.75 V
Lead Channel Pacing Threshold Pulse Width: 0.4 ms
Lead Channel Pacing Threshold Pulse Width: 0.4 ms
Lead Channel Setting Pacing Amplitude: 1.5 V
Lead Channel Setting Pacing Amplitude: 2 V
Lead Channel Setting Pacing Pulse Width: 0.4 ms
Lead Channel Setting Sensing Sensitivity: 2 mV

## 2019-04-10 NOTE — Progress Notes (Signed)
PPM Remote  

## 2019-04-11 DIAGNOSIS — R0602 Shortness of breath: Secondary | ICD-10-CM | POA: Insufficient documentation

## 2019-04-11 HISTORY — DX: Shortness of breath: R06.02

## 2019-04-13 ENCOUNTER — Ambulatory Visit: Payer: PPO | Admitting: Cardiology

## 2019-04-13 ENCOUNTER — Encounter: Payer: Self-pay | Admitting: Cardiology

## 2019-04-13 ENCOUNTER — Other Ambulatory Visit: Payer: Self-pay

## 2019-04-13 VITALS — BP 112/66 | HR 83 | Temp 97.8°F | Ht 63.5 in | Wt 235.0 lb

## 2019-04-13 DIAGNOSIS — I480 Paroxysmal atrial fibrillation: Secondary | ICD-10-CM

## 2019-04-13 DIAGNOSIS — Z950 Presence of cardiac pacemaker: Secondary | ICD-10-CM

## 2019-04-13 DIAGNOSIS — I495 Sick sinus syndrome: Secondary | ICD-10-CM

## 2019-04-13 DIAGNOSIS — E039 Hypothyroidism, unspecified: Secondary | ICD-10-CM

## 2019-04-13 DIAGNOSIS — E782 Mixed hyperlipidemia: Secondary | ICD-10-CM | POA: Diagnosis not present

## 2019-04-13 DIAGNOSIS — Z7901 Long term (current) use of anticoagulants: Secondary | ICD-10-CM

## 2019-04-13 DIAGNOSIS — I110 Hypertensive heart disease with heart failure: Secondary | ICD-10-CM | POA: Diagnosis not present

## 2019-04-13 NOTE — Patient Instructions (Signed)
Medication Instructions:  Your physician recommends that you continue on your current medications as directed. Please refer to the Current Medication list given to you today.  *If you need a refill on your cardiac medications before your next appointment, please call your pharmacy*   Lab Work: None ordered  If you have labs (blood work) drawn today and your tests are completely normal, you will receive your results only by: . MyChart Message (if you have MyChart) OR . A paper copy in the mail If you have any lab test that is abnormal or we need to change your treatment, we will call you to review the results.   Testing/Procedures: None ordered   Follow-Up: At [facility], you and your health needs are our priority.  As part of our continuing mission to provide you with exceptional heart care, we have created designated Provider Care Teams.  These Care Teams include your primary Cardiologist (physician) and Advanced Practice Providers (APPs -  Physician Assistants and Nurse Practitioners) who all work together to provide you with the care you need, when you need it.  We recommend signing up for the patient portal called "MyChart".  Sign up information is provided on this After Visit Summary.  MyChart is used to connect with patients for Virtual Visits (Telemedicine).  Patients are able to view lab/test results, encounter notes, upcoming appointments, etc.  Non-urgent messages can be sent to your provider as well.   To learn more about what you can do with MyChart, go to [email or web address].    Your next appointment:   6 month(s)  The format for your next appointment:   In Person  Provider:   Brian Munley, MD   Other Instructions  

## 2019-04-13 NOTE — Progress Notes (Signed)
Cardiology Office Note:    Date:  04/13/2019   ID:  Carolyn [name], DOB September 15, 1950, MRN [id number]  PCP:  Myrlene Broker, MD  Cardiologist:  Shirlee More, MD    Referring MD: Myrlene Broker, MD    ASSESSMENT:    1. Paroxysmal atrial fibrillation (HCC)   2. Chronic anticoagulation   3. SSS (sick sinus syndrome) ([place])   4. Pacemaker   5. Hypertensive heart disease with heart failure ([place])   6. Mixed hyperlipidemia   7. Acquired hypothyroidism    PLAN:    In order of problems listed above:  1. Stable continue taking sinus rhythm anticoagulant. 2. Stable pacemaker function recent download reviewed 3. BP at target continue current treatment with beta-blocker ACE inhibitor and has no findings of heart failure. 4. Continue her lipid-lowering treatment high intensity statin 5. Continue current thyroid supplement   Next appointment: 6 months   Medication Adjustments/Labs and Tests Ordered: Current medicines are reviewed at length with the patient today.  Concerns regarding medicines are outlined above.  No orders of the defined types were placed in this encounter.  No orders of the defined types were placed in this encounter.   No chief complaint on file.   History of Present Illness:    Carolyn [name] is a 69 y.o. female with a hx of  PAF chadsvasc score of  4 (cryoablation Dec 2017, maintained on Multaq until July 2018), HTN, DM 2, history of hyperthyroidism that was treated now on suppressant therapy, dual-chamber PPM for sinus node dysfunction  She was last seen 09/07/2019. Compliance with diet, lifestyle and medications: Meds  She is exposed allergy and had intense bronchospasm is improved.  Intermittently has edema and takes a diuretic as needed no orthopnea chest pain palpitation or syncope bronchospasm is resolved.  Was seen by her PCP 0 316 shortness of breath productive cough wheezing and chest tightness.  He was diagnosed with and treated for COPD with  exacerbation with Kenalog albuterol and had a chest x-ray performed.  The chest x-ray showed no pulmonary infiltrate or evidence of heart failure.  Most recent labs from [facility] 03/19/1999 hemoglobin A1c 10.7, CMP normal except for a bilirubin 1.6 this is a chronic problem potassium 4.2 creatinine 0.94 normal liver function total cholesterol 146 triglycerides 315 LDL 74 HDL 33 and her TSH was markedly elevated at 29.  I reviewed her last pacemaker download which showed normal device parameters and battery.  Atrial fibrillation burden less than 1% longest episode 14 hours with a controlled ventricular rate and she has anticoagulated. Past Medical History:  Diagnosis Date  . Acquired hypothyroidism 07/07/2016  . Acute gastritis 07/07/2016  . Allergic rhinitis 07/07/2016  . Anemia 07/07/2016  . Anxiety 07/07/2016  . Arthritis    Osteoarthritis  . ATRIAL FIBRILLATION 11/27/2008   Qualifier: Diagnosis of  By: Genevie Cheshire PharmD, Gay Filler    . Atypical chest pain 07/07/2016  . Benign essential hypertension 07/07/2016   Overview:  stable on current meds  . Benign paroxysmal positional vertigo 07/07/2016   Overview:  Avoid rapid headmovement and position change. May taper the gabapentin to see if helps.  . Bilateral low back pain without sciatica 07/07/2016   Overview:  continue gabapentin and meloxicam,  . Bimalleolar ankle fracture 02/21/2014  . Bronchitis 07/07/2016  . Carpal tunnel syndrome   . CHF (congestive heart failure) ([place])    [id number]  . Chronic anticoagulation 12/03/2014  . Chronic diastolic heart failure ([place]) 12/03/2014  .  CKD (chronic kidney disease) stage 3, GFR 30-59 ml/min (HCC) 11/21/2014  . COPD (chronic obstructive pulmonary disease) ([place]) 07/07/2016  . DM2 (diabetes mellitus, type 2) ([place])   . Edema, peripheral 07/07/2016   Overview:  increase lasix to 40 mg bid  . Enthesopathy 07/07/2016  . Esophagitis, reflux 07/07/2016  . Essential hypertension 05/06/2015  . Functional diarrhea  07/07/2016  . GERD (gastroesophageal reflux disease)   . Headache 07/07/2016  . Heart failure ([place])   . History of diverticulosis 07/07/2016  . HLD (hyperlipidemia)   . HTN (hypertension)   . Hyperlipidemia 05/06/2015  . Hypertensive heart disease with heart failure ([place]) 12/03/2014  . Hypothyroid 07/07/2016  . Hypothyroidism   . Irritable bowel syndrome 07/07/2016   Overview:  IBS information sheet provided,  dietary changes suggested  . Low back pain 07/07/2016   Overview:  lumbar spine films at [facility]  . Major depressive disorder, recurrent, moderate ([place]) 07/07/2016  . Microalbuminuria due to type 2 diabetes mellitus ([place]) 05/06/2015  . Midline low back pain without sciatica 07/07/2016   Overview:  --RTC for warning signs (Fevers, chills, loss of bowel/bladder function, saddle numbness), worsening pain or failure for pain to improve with symptomatic treatment --reviewed natural course of disease that acute LBP takes 4-6 weeks to recover --cont meds listed for symptomatic relief --follow up in 2-4 weeks or earlier if warning signs develop  . Nerve pain 07/07/2016  . Obesity   . Obesity (BMI 30-39.9) 05/06/2015  . OSA (obstructive sleep apnea) 07/07/2016  . Osteoarthritis 07/07/2016  . Other ventral hernia 07/07/2016  . Overweight 07/07/2016  . Pacemaker 11/21/2014  . PAF (paroxysmal atrial fibrillation) ([place])   . Presence of permanent cardiac pacemaker    09-24-13 01-21-14 Last interrogation in Epic at [facility]  . Rhinosinusitis 07/07/2016  . Seasonal allergies 07/07/2016  . Sleep apnea    No cpap used  . Smoker 07/07/2016  . SSS (sick sinus syndrome) ([place]) 11/21/2014  . Symptomatic menopausal or female climacteric states 07/07/2016  . Thoracic back pain 07/07/2016  . Type II or unspecified type diabetes mellitus with renal manifestations, uncontrolled 07/07/2016    Past Surgical History:  Procedure Laterality Date  . BREAST LUMPECTOMY WITH RADIOACTIVE SEED LOCALIZATION Right 08/02/2017    Procedure: BREAST LUMPECTOMY WITH RADIOACTIVE SEED LOCALIZATION;  Surgeon: Erroll Luna, MD;  Location: [facility];  Service: General;  Laterality: Right;  . CARPAL TUNNEL RELEASE    . CHOLECYSTECTOMY    . HERNIA REPAIR    . ORIF ANKLE FRACTURE Left 02/21/2014   Procedure: OPEN REDUCTION INTERNAL FIXATION (ORIF) LEFT  ANKLE FRACTURE;  Surgeon: Tobi Bastos, MD;  Location: [facility];  Service: Orthopedics;  Laterality: Left;  . PACEMAKER INSERTION  09-24-13  . TOTAL HIP ARTHROPLASTY    . TUBAL LIGATION      Current Medications: No outpatient medications have been marked as taking for the 04/13/19 encounter (Appointment) with Richardo Priest, MD.     Allergies:   Meperidine and Hydrocodone-acetaminophen   Social History   Socioeconomic History  . Marital status: Divorced    Spouse name: Not on file  . Number of children: 2  . Years of education: Not on file  . Highest education level: Not on file  Occupational History  . Occupation: [employer]  Tobacco Use  . Smoking status: Former Smoker    Years: 2.00    Quit date: 11/25/2008    Years since quitting: 10.3  . Smokeless tobacco: Never Used  Substance and Sexual Activity  . Alcohol use: No  . Drug use: No  . Sexual activity: Not on file  Other Topics Concern  . Not on file  Social History Narrative  . Not on file   Social Determinants of Health   Financial Resource Strain:   . Difficulty of Paying Living Expenses:   Food Insecurity:   . Worried About Charity fundraiser in the Last Year:   . Arboriculturist in the Last Year:   Transportation Needs:   . Film/video editor (Medical):   Marland Kitchen Lack of Transportation (Non-Medical):   Physical Activity:   . Days of Exercise per Week:   . Minutes of Exercise per Session:   Stress:   . Feeling of Stress :   Social Connections:   . Frequency of Communication with Friends and Family:   . Frequency of Social Gatherings with Friends and Family:   . Attends Religious  Services:   . Active Member of Clubs or Organizations:   . Attends Archivist Meetings:   Marland Kitchen Marital Status:      Family History: The patient's family history includes Diabetes in her brother and sister; Heart failure in an other family member. ROS:   Please see the history of present illness.    All other systems reviewed and are negative.  EKGs/Labs/Other Studies Reviewed:    The following studies were reviewed today:    Recent Labs: 09/07/2018: ALT 25; BUN 15; Creatinine, Ser 0.92; Hemoglobin 13.2; Platelets 232; Potassium 4.2; Sodium 139; TSH 5.940  Recent Lipid Panel    Component Value Date/Time   CHOL 150 09/07/2018 0919   TRIG 427 (H) 09/07/2018 0919   HDL 31 (L) 09/07/2018 0919   CHOLHDL 4.8 (H) 09/07/2018 0919   LDLCALC Comment 09/07/2018 0919    Physical Exam:    VS:  LMP 02/26/2005     Wt Readings from Last 3 Encounters:  09/07/18 230 lb 12.8 oz (104.7 kg)  04/06/18 232 lb 6.4 oz (105.4 kg)  03/08/18 233 lb 1.3 oz (105.7 kg)     GEN:  Well nourished, well developed in no acute distress HEENT: Normal NECK: No JVD; No carotid bruits LYMPHATICS: No lymphadenopathy CARDIAC: RRR, no murmurs, rubs, gallops RESPIRATORY:  Clear to auscultation without rales, wheezing or rhonchi  ABDOMEN: Soft, non-tender, non-distended MUSCULOSKELETAL:  No edema; No deformity  SKIN: Warm and dry NEUROLOGIC:  Alert and oriented x 3 PSYCHIATRIC:  Normal affect    Signed, Shirlee More, MD  04/13/2019 11:25 AM    [place]

## 2019-04-16 ENCOUNTER — Other Ambulatory Visit: Payer: Self-pay | Admitting: Cardiology

## 2019-04-18 ENCOUNTER — Ambulatory Visit: Payer: BC Managed Care – PPO | Admitting: Cardiology

## 2019-04-20 DIAGNOSIS — Z1231 Encounter for screening mammogram for malignant neoplasm of breast: Secondary | ICD-10-CM | POA: Diagnosis not present

## 2019-05-15 ENCOUNTER — Other Ambulatory Visit: Payer: Self-pay | Admitting: *Deleted

## 2019-05-15 MED ORDER — OMEGA-3-ACID ETHYL ESTERS 1 G PO CAPS: 2.0000 g | ORAL_CAPSULE | Freq: Two times a day (BID) | ORAL | 1 refills | Status: DC

## 2019-05-15 NOTE — Telephone Encounter (Signed)
Received request from pharmacy for Omega-3. Rx faxed to pharmacy per request.

## 2019-06-04 DIAGNOSIS — E1129 Type 2 diabetes mellitus with other diabetic kidney complication: Secondary | ICD-10-CM | POA: Diagnosis not present

## 2019-06-04 DIAGNOSIS — Z794 Long term (current) use of insulin: Secondary | ICD-10-CM | POA: Diagnosis not present

## 2019-06-04 DIAGNOSIS — R809 Proteinuria, unspecified: Secondary | ICD-10-CM | POA: Diagnosis not present

## 2019-06-04 DIAGNOSIS — N183 Chronic kidney disease, stage 3 unspecified: Secondary | ICD-10-CM | POA: Diagnosis not present

## 2019-06-14 ENCOUNTER — Telehealth: Payer: Self-pay | Admitting: Cardiology

## 2019-06-14 NOTE — Telephone Encounter (Signed)
Please continue up to date list of patient's medications.  When she calls back.

## 2019-06-14 NOTE — Telephone Encounter (Signed)
Pt c/o BP issue: STAT if pt c/o blurred vision, one-sided weakness or slurred speech  1. What are your last 5 BP readings?   06-14-19:129/70, 145/85, 95/58  2. Are you having any other symptoms (ex. Dizziness, headache, blurred vision, passed out)? Legs start shaking and feel like rubber   3. What is your BP issue? Pt feels like when her BP is low she gets shaky and weak. Pt wasn't sure if she needed to be seen sooner than Tuesday

## 2019-06-14 NOTE — Telephone Encounter (Signed)
Left message on patients voicemail to please return our call in regards to this issue.

## 2019-06-15 NOTE — Telephone Encounter (Signed)
Follow up ° ° °Patient is returning call per previous message. Please call. °

## 2019-06-15 NOTE — Telephone Encounter (Signed)
Left message on patients voicemail to please return our call.   

## 2019-06-15 NOTE — Telephone Encounter (Signed)
Spoke to patient just now and made sure that her medication list is up to date and correct. She states that it is. I also let her know Dr. Julien Nordmann recommendation to continue her up to date medications. She states that she feels better today.  She verbalizes understanding and no other issues or concerns were noted.

## 2019-06-18 NOTE — Progress Notes (Signed)
Cardiology Office Note:    Date:  06/19/2019   ID:  Carolyn [name], DOB 09/03/1950, MRN [id number]  PCP:  Carolyn Broker, MD  Cardiologist:  Carolyn More, MD    Referring MD: Carolyn Broker, MD    ASSESSMENT:    1. Orthostatic hypotension   2. Paroxysmal atrial fibrillation (HCC)   3. Chronic anticoagulation   4. SSS (sick sinus syndrome) ([place])   5. Pacemaker   6. Hypertensive heart disease with heart failure ([place])   7. Acquired hypothyroidism   8. Mild CAD    PLAN:    In order of problems listed above:  1. She is having frequent episodes of symptomatic orthostatic hypotension related to diabetes autonomic neuropathy chronic diarrhea and her medications.  Again I have her hold her ACE inhibitor we can reconsider a very low dose in the future reduce beta-blocker 50% and monitor blood pressure sitting and standing twice daily at home.  With mild CAD no exertional symptoms she will undergo myocardial perfusion study and if abnormal would benefit from coronary angiography. 2. Stable atrial fibrillation low burden continue beta-blocker reduced dose and anticoagulant 3. Stable pacemaker function with follow-up in the clinic 4. Continue current thyroid supplement 5. Check myocardial perfusion study with mild CAD by history and exertional symptoms   Next appointment: 6 weeks   Medication Adjustments/Labs and Tests Ordered: Current medicines are reviewed at length with the patient today.  Concerns regarding medicines are outlined above.  No orders of the defined types were placed in this encounter.  No orders of the defined types were placed in this encounter.   Chief Complaint  Patient presents with  . Follow-up    Sick sinus syndrome with placement  . Atrial Fibrillation    History of Present Illness:    Carolyn [name] is a 69 y.o. female with a hx of PAF chadsvasc score of  4 (cryoablation Dec 2017, maintained on Multaq until July 2018), HTN, DM 2, history of  hyperthyroidism that was treated now on suppressant therapy, dual-chamber PPM for sinus node dysfunction heart failure stage III CKD type 2 diabetes last seen 04/13/2019.  She phoned our office last week with complaints of low blood pressure and weakness.  Her pacemaker check 04/09/2019 showed normal lead parameters function battery.  AF burden less than 1% longest episode 14 hours with controlled ventricular rate. Compliance with diet, lifestyle and medications: Yes  She has been having repeated episodes several times a week sometimes several times a day of standing and exertional weakness and near syncope.  She has had blood pressure has been difficult for people to get in the office and at home as low as 80 systolic with it.  In the office today she is 112/60 sitting and 90/50 standing with weakness.  She has no edema and does not take a diuretic.  She has had no typical chest pain shortness of breath orthopnea or syncope.  She does have chronic diarrhea which may play a role and takes an ACE ACE inhibitor and beta-blocker.  Her dose of beta-blocker was increased several months ago.  Labs 06/04/2019 [facility] PCP: CMP shows a glucose of 233 BUN of 26 creatinine 1.15 GFR 49 cc potassium 4.7 Cholesterol 146 triglycerides 315 HDL 33 LDL direct 74 TSH 29.03 Hemoglobin A1c 11.6% CBC normal hemoglobin 14.2 Past Medical History:  Diagnosis Date  . Acquired hypothyroidism 07/07/2016  . Acute gastritis 07/07/2016  . Allergic rhinitis 07/07/2016  . Anemia 07/07/2016  .  Anxiety 07/07/2016  . Arthritis    Osteoarthritis  . ATRIAL FIBRILLATION 11/27/2008   Qualifier: Diagnosis of  By: Carolyn [name] PharmD, Carolyn [name]    . Atypical chest pain 07/07/2016  . Benign essential hypertension 07/07/2016   Overview:  stable on current meds  . Benign paroxysmal positional vertigo 07/07/2016   Overview:  Avoid rapid headmovement and position change. May taper the gabapentin to see if helps.  . Bilateral low back pain  without sciatica 07/07/2016   Overview:  continue gabapentin and meloxicam,  . Bimalleolar ankle fracture 02/21/2014  . Bronchitis 07/07/2016  . Carpal tunnel syndrome   . CHF (congestive heart failure) ([place])    [id number]  . Chronic anticoagulation 12/03/2014  . Chronic diastolic heart failure ([place]) 12/03/2014  . CKD (chronic kidney disease) stage 3, GFR 30-59 ml/min 11/21/2014  . COPD (chronic obstructive pulmonary disease) ([place]) 07/07/2016  . DM2 (diabetes mellitus, type 2) ([place])   . Edema, peripheral 07/07/2016   Overview:  increase lasix to 40 mg bid  . Enthesopathy 07/07/2016  . Esophagitis, reflux 07/07/2016  . Essential hypertension 05/06/2015  . Functional diarrhea 07/07/2016  . GERD (gastroesophageal reflux disease)   . Headache 07/07/2016  . Heart failure ([place])   . History of diverticulosis 07/07/2016  . HLD (hyperlipidemia)   . HTN (hypertension)   . Hyperlipidemia 05/06/2015  . Hypertensive heart disease with heart failure ([place]) 12/03/2014  . Hypothyroid 07/07/2016  . Hypothyroidism   . Irritable bowel syndrome 07/07/2016   Overview:  IBS information sheet provided,  dietary changes suggested  . Low back pain 07/07/2016   Overview:  lumbar spine films at [facility]  . Major depressive disorder, recurrent, moderate ([place]) 07/07/2016  . Microalbuminuria due to type 2 diabetes mellitus ([place]) 05/06/2015  . Midline low back pain without sciatica 07/07/2016   Overview:  --RTC for warning signs (Fevers, chills, loss of bowel/bladder function, saddle numbness), worsening pain or failure for pain to improve with symptomatic treatment --reviewed natural course of disease that acute LBP takes 4-6 weeks to recover --cont meds listed for symptomatic relief --follow up in 2-4 weeks or earlier if warning signs develop  . Nerve pain 07/07/2016  . Obesity   . Obesity (BMI 30-39.9) 05/06/2015  . OSA (obstructive sleep apnea) 07/07/2016  . Osteoarthritis 07/07/2016  . Other ventral hernia 07/07/2016  . Overweight  07/07/2016  . Pacemaker 11/21/2014  . PAF (paroxysmal atrial fibrillation) ([place])   . Presence of permanent cardiac pacemaker    09-24-13 01-21-14 Last interrogation in Epic at [place]  . Rhinosinusitis 07/07/2016  . Seasonal allergies 07/07/2016  . Sleep apnea    No cpap used  . Smoker 07/07/2016  . SSS (sick sinus syndrome) ([place]) 11/21/2014  . Symptomatic menopausal or female climacteric states 07/07/2016  . Thoracic back pain 07/07/2016  . Type II or unspecified type diabetes mellitus with renal manifestations, uncontrolled 07/07/2016    Past Surgical History:  Procedure Laterality Date  . BREAST LUMPECTOMY WITH RADIOACTIVE SEED LOCALIZATION Right 08/02/2017   Procedure: BREAST LUMPECTOMY WITH RADIOACTIVE SEED LOCALIZATION;  Surgeon: Erroll Luna, MD;  Location: [facility];  Service: General;  Laterality: Right;  . CARPAL TUNNEL RELEASE    . CHOLECYSTECTOMY    . HERNIA REPAIR    . ORIF ANKLE FRACTURE Left 02/21/2014   Procedure: OPEN REDUCTION INTERNAL FIXATION (ORIF) LEFT  ANKLE FRACTURE;  Surgeon: Tobi Bastos, MD;  Location: [facility];  Service: Orthopedics;  Laterality: Left;  . PACEMAKER INSERTION  09-24-13  .  TOTAL HIP ARTHROPLASTY    . TUBAL LIGATION      Current Medications: Current Meds  Medication Sig  . albuterol (PROVENTIL HFA;VENTOLIN HFA) 108 (90 Base) MCG/ACT inhaler Inhale 2 puffs into the lungs every 6 (six) hours as needed for wheezing or shortness of breath.  Marland Kitchen atorvastatin (LIPITOR) 20 MG tablet Take 1 tablet (20 mg total) by mouth at bedtime.  . benazepril (LOTENSIN) 10 MG tablet TAKE 1 TABLET BY MOUTH EVERY DAY  . ELIQUIS 5 MG TABS tablet TAKE 1 TABLET BY MOUTH TWICE A DAY  . escitalopram (LEXAPRO) 20 MG tablet Take 20 mg by mouth daily.    . fluticasone (FLONASE) 50 MCG/ACT nasal spray SPRAY 1 SPRAY INTO EACH NOSTRIL EVERY DAY  . gabapentin (NEURONTIN) 300 MG capsule Take 1 capsule by mouth 3 (three) times daily.  Marland Kitchen glucose blood (PRECISION QID TEST)  test strip 1 strip by Misc.(Non-Drug; Combo Route) route 2 times daily.  . insulin detemir (LEVEMIR FLEXTOUCH) 100 UNIT/ML FlexPen Inject 60 Units into the skin daily.  . Insulin Pen Needle (FIFTY50 PEN NEEDLES) 32G X 4 MM MISC Use pen needles as directed for diabetic medications  . isosorbide mononitrate (IMDUR) 30 MG 24 hr tablet TAKE 1 TABLET BY MOUTH EVERY DAY IN THE MORNING  . levothyroxine (SYNTHROID, LEVOTHROID) 150 MCG tablet Take 150 mcg by mouth daily before breakfast.   . liraglutide (VICTOZA) 18 MG/3ML SOPN INJECT 1.8MG  SUBCUTANEOUSLY EVERY MORNING  . metoprolol tartrate (LOPRESSOR) 100 MG tablet Take 1 tablet (100 mg total) by mouth 2 (two) times daily.  Marland Kitchen omega-3 acid ethyl esters (LOVAZA) 1 g capsule Take 2 capsules (2 g total) by mouth 2 (two) times daily.  . SYMBICORT 160-4.5 MCG/ACT inhaler Inhale 2 puffs into the lungs 2 (two) times daily as needed for shortness of breath.  . torsemide (DEMADEX) 20 MG tablet Take 20 mg by mouth daily as needed (for feet swelling).  . Vitamin D, Ergocalciferol, (DRISDOL) 1.25 MG (50000 UT) CAPS capsule TAKE ONE CAPSULE BY MOUTH ONE TIME PER WEEK     Allergies:   Meperidine and Hydrocodone-acetaminophen   Social History   Socioeconomic History  . Marital status: Divorced    Spouse name: Not on file  . Number of children: 2  . Years of education: Not on file  . Highest education level: Not on file  Occupational History  . Occupation: [employer]  Tobacco Use  . Smoking status: Former Smoker    Years: 2.00    Quit date: 11/25/2008    Years since quitting: 10.5  . Smokeless tobacco: Never Used  Substance and Sexual Activity  . Alcohol use: No  . Drug use: No  . Sexual activity: Not on file  Other Topics Concern  . Not on file  Social History Narrative  . Not on file   Social Determinants of Health   Financial Resource Strain:   . Difficulty of Paying Living Expenses:   Food Insecurity:   . Worried About Charity fundraiser  in the Last Year:   . Arboriculturist in the Last Year:   Transportation Needs:   . Film/video editor (Medical):   Marland Kitchen Lack of Transportation (Non-Medical):   Physical Activity:   . Days of Exercise per Week:   . Minutes of Exercise per Session:   Stress:   . Feeling of Stress :   Social Connections:   . Frequency of Communication with Friends and Family:   . Frequency  of Social Gatherings with Friends and Family:   . Attends Religious Services:   . Active Member of Clubs or Organizations:   . Attends Archivist Meetings:   Marland Kitchen Marital Status:      Family History: The patient's family history includes Diabetes in her brother and sister; Heart failure in an other family member. ROS:   Please see the history of present illness.    All other systems reviewed and are negative.  EKGs/Labs/Other Studies Reviewed:    The following studies were reviewed today:  EKG:  EKG ordered today and personally reviewed.  The ekg ordered today demonstrates atrial paced rhythm normal QRS morphology  Recent Labs: 09/07/2018: ALT 25; BUN 15; Creatinine, Ser 0.92; Hemoglobin 13.2; Platelets 232; Potassium 4.2; Sodium 139; TSH 5.940  Recent Lipid Panel    Component Value Date/Time   CHOL 150 09/07/2018 0919   TRIG 427 (H) 09/07/2018 0919   HDL 31 (L) 09/07/2018 0919   CHOLHDL 4.8 (H) 09/07/2018 0919   LDLCALC Comment 09/07/2018 0919    Physical Exam:    VS:  BP 122/78   Pulse 94   Ht 5' 3.5" (1.613 m)   Wt 235 lb (106.6 kg)   LMP 02/26/2005   SpO2 98%   BMI 40.98 kg/m     Wt Readings from Last 3 Encounters:  06/19/19 235 lb (106.6 kg)  04/13/19 235 lb (106.6 kg)  09/07/18 230 lb 12.8 oz (104.7 kg)     GEN: Appears her age well nourished, well developed in no acute distress HEENT: Normal NECK: No JVD; No carotid bruits LYMPHATICS: No lymphadenopathy CARDIAC: RRR, no murmurs, rubs, gallops RESPIRATORY:  Clear to auscultation without rales, wheezing or rhonchi    ABDOMEN: Soft, non-tender, non-distended MUSCULOSKELETAL:  No edema; No deformity  SKIN: Warm and dry NEUROLOGIC:  Alert and oriented x 3 PSYCHIATRIC:  Normal affect    Signed, Carolyn More, MD  06/19/2019 3:43 PM    [facility]

## 2019-06-19 ENCOUNTER — Ambulatory Visit: Payer: PPO | Admitting: Cardiology

## 2019-06-19 ENCOUNTER — Encounter: Payer: Self-pay | Admitting: Cardiology

## 2019-06-19 ENCOUNTER — Other Ambulatory Visit: Payer: Self-pay

## 2019-06-19 VITALS — BP 122/78 | HR 94 | Ht 63.5 in | Wt 235.0 lb

## 2019-06-19 DIAGNOSIS — I480 Paroxysmal atrial fibrillation: Secondary | ICD-10-CM

## 2019-06-19 DIAGNOSIS — I110 Hypertensive heart disease with heart failure: Secondary | ICD-10-CM | POA: Diagnosis not present

## 2019-06-19 DIAGNOSIS — I951 Orthostatic hypotension: Secondary | ICD-10-CM | POA: Diagnosis not present

## 2019-06-19 DIAGNOSIS — I2510 Atherosclerotic heart disease of native coronary artery without angina pectoris: Secondary | ICD-10-CM

## 2019-06-19 DIAGNOSIS — E039 Hypothyroidism, unspecified: Secondary | ICD-10-CM

## 2019-06-19 DIAGNOSIS — Z950 Presence of cardiac pacemaker: Secondary | ICD-10-CM

## 2019-06-19 DIAGNOSIS — I495 Sick sinus syndrome: Secondary | ICD-10-CM

## 2019-06-19 DIAGNOSIS — Z7901 Long term (current) use of anticoagulants: Secondary | ICD-10-CM

## 2019-06-19 MED ORDER — METOPROLOL TARTRATE 100 MG PO TABS: 50.0000 mg | ORAL_TABLET | Freq: Two times a day (BID) | ORAL | 3 refills | Status: DC

## 2019-06-19 MED ORDER — ISOSORBIDE MONONITRATE ER 30 MG PO TB24: ORAL_TABLET | ORAL | 5 refills | Status: DC

## 2019-06-19 NOTE — Patient Instructions (Addendum)
Medication Instructions:  Your physician has recommended you make the following change in your medication: STOP: Lotensin DECREASE: Metoprolol 50 mg take 0.5 tablet by mouth twice daily.  *If you need a refill on your cardiac medications before your next appointment, please call your pharmacy*   Lab Work: None If you have labs (blood work) drawn today and your tests are completely normal, you will receive your results only by: Marland Kitchen MyChart Message (if you have MyChart) OR . A paper copy in the mail If you have any lab test that is abnormal or we need to change your treatment, we will call you to review the results.   Testing/Procedures:   [facility] [address] [place], [place] [address] Phone:  [phone]    Please arrive 15 minutes prior to your appointment time for registration and insurance purposes.  The test will take approximately 3 to 4 hours to complete; you may bring reading material.  If someone comes with you to your appointment, they will need to remain in the main lobby due to limited space in the testing area. **If you are pregnant or breastfeeding, please notify the nuclear lab prior to your appointment**  How to prepare for your Myocardial Perfusion Test: . Do not eat or drink 3 hours prior to your test, except you may have water. . Do not consume products containing caffeine (regular or decaffeinated) 12 hours prior to your test. (ex: coffee, chocolate, sodas, tea). . Do bring a list of your current medications with you.  If not listed below, you may take your medications as normal. .  HOLD diabetic medication/insulin the morning of the test. Take half of long acting insulin the night before test. . Do wear comfortable clothes (no dresses or overalls) and walking shoes, tennis shoes preferred (No heels or open toe shoes are allowed). . Do NOT wear cologne, perfume, aftershave, or lotions (deodorant is allowed). . If these instructions  are not followed, your test will have to be rescheduled.  Please report to [address] for your test.  If you have questions or concerns about your appointment, you can call the [facility] at [phone].  If you cannot keep your appointment, please provide 24 hours notification to the Nuclear Lab, to avoid a possible $50 charge to your account.    Follow-Up: At [facility], you and your health needs are our priority.  As part of our continuing mission to provide you with exceptional heart care, we have created designated Provider Care Teams.  These Care Teams include your primary Cardiologist (physician) and Advanced Practice Providers (APPs -  Physician Assistants and Nurse Practitioners) who all work together to provide you with the care you need, when you need it.  We recommend signing up for the patient portal called "MyChart".  Sign up information is provided on this After Visit Summary.  MyChart is used to connect with patients for Virtual Visits (Telemedicine).  Patients are able to view lab/test results, encounter notes, upcoming appointments, etc.  Non-urgent messages can be sent to your provider as well.   To learn more about what you can do with MyChart, go to [email or web address].    Your next appointment:   6 week(s)  The format for your next appointment:   In Person  Provider:   Shirlee More, MD   Other Instructions

## 2019-07-04 DIAGNOSIS — Z794 Long term (current) use of insulin: Secondary | ICD-10-CM | POA: Diagnosis not present

## 2019-07-04 DIAGNOSIS — I480 Paroxysmal atrial fibrillation: Secondary | ICD-10-CM | POA: Diagnosis not present

## 2019-07-04 DIAGNOSIS — N1831 Chronic kidney disease, stage 3a: Secondary | ICD-10-CM | POA: Diagnosis not present

## 2019-07-04 DIAGNOSIS — Z950 Presence of cardiac pacemaker: Secondary | ICD-10-CM | POA: Diagnosis not present

## 2019-07-04 DIAGNOSIS — E1122 Type 2 diabetes mellitus with diabetic chronic kidney disease: Secondary | ICD-10-CM | POA: Diagnosis not present

## 2019-07-04 DIAGNOSIS — N189 Chronic kidney disease, unspecified: Secondary | ICD-10-CM | POA: Diagnosis not present

## 2019-07-04 DIAGNOSIS — E1165 Type 2 diabetes mellitus with hyperglycemia: Secondary | ICD-10-CM | POA: Diagnosis not present

## 2019-07-04 DIAGNOSIS — I129 Hypertensive chronic kidney disease with stage 1 through stage 4 chronic kidney disease, or unspecified chronic kidney disease: Secondary | ICD-10-CM | POA: Diagnosis not present

## 2019-07-04 DIAGNOSIS — I495 Sick sinus syndrome: Secondary | ICD-10-CM | POA: Diagnosis not present

## 2019-07-04 DIAGNOSIS — Z6841 Body Mass Index (BMI) 40.0 and over, adult: Secondary | ICD-10-CM | POA: Diagnosis not present

## 2019-07-09 ENCOUNTER — Ambulatory Visit (INDEPENDENT_AMBULATORY_CARE_PROVIDER_SITE_OTHER): Payer: PPO | Admitting: *Deleted

## 2019-07-09 DIAGNOSIS — I495 Sick sinus syndrome: Secondary | ICD-10-CM

## 2019-07-10 ENCOUNTER — Telehealth (HOSPITAL_COMMUNITY): Payer: Self-pay | Admitting: *Deleted

## 2019-07-10 NOTE — Telephone Encounter (Signed)
Left message on voicemail per DPR in reference to upcoming appointment scheduled on 07/17/19 with detailed instructions given per Myocardial Perfusion Study Information Sheet for the test. LM to arrive 15 minutes early, and that it is imperative to arrive on time for appointment to keep from having the test rescheduled. If you need to cancel or reschedule your appointment, please call the office within 24 hours of your appointment. Failure to do so may result in a cancellation of your appointment, and a $50 no show fee. Phone number given for call back for any questions. Kirstie Peri

## 2019-07-11 LAB — CUP PACEART REMOTE DEVICE CHECK
Battery Impedance: 234 Ohm
Battery Remaining Longevity: 143 mo
Battery Voltage: 2.79 V
Brady Statistic AP VP Percent: 1 %
Brady Statistic AP VS Percent: 13 %
Brady Statistic AS VP Percent: 0 %
Brady Statistic AS VS Percent: 86 %
Date Time Interrogation Session: 20210616093826
Implantable Lead Implant Date: 20150831
Implantable Lead Implant Date: 20150831
Implantable Lead Location: 753859
Implantable Lead Location: 753860
Implantable Lead Model: 4092
Implantable Lead Model: 5076
Implantable Pulse Generator Implant Date: 20150831
Lead Channel Impedance Value: 576 Ohm
Lead Channel Impedance Value: 586 Ohm
Lead Channel Pacing Threshold Amplitude: 0.625 V
Lead Channel Pacing Threshold Amplitude: 0.875 V
Lead Channel Pacing Threshold Pulse Width: 0.4 ms
Lead Channel Pacing Threshold Pulse Width: 0.4 ms
Lead Channel Setting Pacing Amplitude: 1.5 V
Lead Channel Setting Pacing Amplitude: 2 V
Lead Channel Setting Pacing Pulse Width: 0.4 ms
Lead Channel Setting Sensing Sensitivity: 2 mV

## 2019-07-12 NOTE — Progress Notes (Signed)
Remote pacemaker transmission.   

## 2019-07-17 ENCOUNTER — Other Ambulatory Visit: Payer: Self-pay

## 2019-07-17 ENCOUNTER — Ambulatory Visit (INDEPENDENT_AMBULATORY_CARE_PROVIDER_SITE_OTHER): Payer: PPO

## 2019-07-17 DIAGNOSIS — I480 Paroxysmal atrial fibrillation: Secondary | ICD-10-CM

## 2019-07-17 DIAGNOSIS — I951 Orthostatic hypotension: Secondary | ICD-10-CM | POA: Diagnosis not present

## 2019-07-17 DIAGNOSIS — I2510 Atherosclerotic heart disease of native coronary artery without angina pectoris: Secondary | ICD-10-CM | POA: Diagnosis not present

## 2019-07-17 DIAGNOSIS — I495 Sick sinus syndrome: Secondary | ICD-10-CM | POA: Diagnosis not present

## 2019-07-17 DIAGNOSIS — Z7901 Long term (current) use of anticoagulants: Secondary | ICD-10-CM

## 2019-07-17 DIAGNOSIS — I110 Hypertensive heart disease with heart failure: Secondary | ICD-10-CM | POA: Diagnosis not present

## 2019-07-17 DIAGNOSIS — Z950 Presence of cardiac pacemaker: Secondary | ICD-10-CM

## 2019-07-17 DIAGNOSIS — E039 Hypothyroidism, unspecified: Secondary | ICD-10-CM

## 2019-07-17 MED ORDER — REGADENOSON 0.4 MG/5ML IV SOLN
0.4000 mg | Freq: Once | INTRAVENOUS | Status: AC
  Administered 2019-07-17: 0.4 mg via INTRAVENOUS

## 2019-07-17 MED ORDER — TECHNETIUM TC 99M TETROFOSMIN IV KIT
30.2000 | PACK | Freq: Once | INTRAVENOUS | Status: AC | PRN
  Administered 2019-07-17: 30.2 via INTRAVENOUS

## 2019-07-18 ENCOUNTER — Ambulatory Visit: Payer: PPO

## 2019-07-18 LAB — MYOCARDIAL PERFUSION IMAGING
LV dias vol: 69 mL (ref 46–106)
LV sys vol: 24 mL
Peak HR: 104 {beats}/min
Rest HR: 75 {beats}/min
SDS: 6
SRS: 8
SSS: 14
TID: 0.9

## 2019-07-18 MED ORDER — TECHNETIUM TC 99M TETROFOSMIN IV KIT
30.2000 | PACK | Freq: Once | INTRAVENOUS | Status: AC | PRN
  Administered 2019-07-17: 30.2 via INTRAVENOUS

## 2019-07-18 MED ORDER — REGADENOSON 0.4 MG/5ML IV SOLN
0.4000 mg | Freq: Once | INTRAVENOUS | Status: AC
  Administered 2019-07-17: 0.4 mg via INTRAVENOUS

## 2019-07-18 MED ORDER — TECHNETIUM TC 99M TETROFOSMIN IV KIT
30.6000 | PACK | Freq: Once | INTRAVENOUS | Status: AC | PRN
  Administered 2019-07-18: 30.6 via INTRAVENOUS

## 2019-07-19 ENCOUNTER — Telehealth: Payer: Self-pay

## 2019-07-19 NOTE — Telephone Encounter (Signed)
Follow up   Pt is returning call Call transferred to [facility]

## 2019-07-19 NOTE — Telephone Encounter (Signed)
Spoke with patient regarding results and recommendation.  Patient verbalizes understanding and is agreeable to plan of care. Advised patient to call back with any issues or concerns.  

## 2019-07-19 NOTE — Telephone Encounter (Signed)
Left message on patients voicemail to please return our call.   

## 2019-07-27 ENCOUNTER — Encounter: Payer: Self-pay | Admitting: Cardiology

## 2019-07-27 ENCOUNTER — Other Ambulatory Visit: Payer: Self-pay

## 2019-07-27 ENCOUNTER — Ambulatory Visit: Payer: PPO | Admitting: Cardiology

## 2019-07-27 VITALS — BP 120/70 | HR 87 | Ht 63.0 in | Wt 240.2 lb

## 2019-07-27 DIAGNOSIS — I951 Orthostatic hypotension: Secondary | ICD-10-CM

## 2019-07-27 DIAGNOSIS — I110 Hypertensive heart disease with heart failure: Secondary | ICD-10-CM

## 2019-07-27 NOTE — Patient Instructions (Signed)
Medication Instructions:  Your physician recommends that you continue on your current medications as directed. Please refer to the Current Medication list given to you today.  Take torsemide 20 mg daily if your weight is above 244 pounds.  *If you need a refill on your cardiac medications before your next appointment, please call your pharmacy*   Lab Work: None If you have labs (blood work) drawn today and your tests are completely normal, you will receive your results only by: Marland Kitchen MyChart Message (if you have MyChart) OR . A paper copy in the mail If you have any lab test that is abnormal or we need to change your treatment, we will call you to review the results.   Testing/Procedures: None   Follow-Up: At [facility], you and your health needs are our priority.  As part of our continuing mission to provide you with exceptional heart care, we have created designated Provider Care Teams.  These Care Teams include your primary Cardiologist (physician) and Advanced Practice Providers (APPs -  Physician Assistants and Nurse Practitioners) who all work together to provide you with the care you need, when you need it.  We recommend signing up for the patient portal called "MyChart".  Sign up information is provided on this After Visit Summary.  MyChart is used to connect with patients for Virtual Visits (Telemedicine).  Patients are able to view lab/test results, encounter notes, upcoming appointments, etc.  Non-urgent messages can be sent to your provider as well.   To learn more about what you can do with MyChart, go to [email or web address].    Your next appointment:   6 month(s)  The format for your next appointment:   In Person  Provider:   Shirlee More, MD   Other Instructions

## 2019-07-27 NOTE — Progress Notes (Signed)
Cardiology Office Note:    Date:  07/27/2019   ID:  Carolyn [name], DOB [birth year], MRN [id number]  PCP:  Myrlene Broker, MD  Cardiologist:  Shirlee More, MD    Referring MD: Myrlene Broker, MD    ASSESSMENT:    1. Hypertensive heart disease with heart failure ([place])   2. Orthostatic hypotension    PLAN:    In order of problems listed above:  1. Improved BP at target does not have edema fluid overload and is not having symptomatic orthostatic hypotension.  She will take her diuretic as needed for weight gain along with her beta-blocker oral nitrate.  For hyperlipidemia continue her statin.  She tolerates her anticoagulant continue this with atrial fibrillation pacemaker is function is stable followed in our practice.   Next appointment: 6 months   Medication Adjustments/Labs and Tests Ordered: Current medicines are reviewed at length with the patient today.  Concerns regarding medicines are outlined above.  No orders of the defined types were placed in this encounter.  No orders of the defined types were placed in this encounter.   Chief Complaint  Patient presents with   Follow-up   Hypotension    History of Present Illness:    Carolyn [name] is a 69 y.o. female with a hx of PAF chadsvasc score of  4 (cryoablation Dec 2017, maintained on Multaq until July 2018), HTN, DM 2, history of hyperthyroidism that was treated now on suppressant therapy, dual-chamber PPM for sinus node dysfunction heart failure stage III CKD type 2 diabetes  last seen 06/19/2019 with symptomatic orthostatic hypotension.  She was seen by nephrology this week 07/04/2019 for kidney disease. Compliance with diet, lifestyle and medications: Yes  Referred for abnormal kidney function Cr 1.0-1.1 in 2019 0.9-1.1 in 2020 0.9 in 2/21 and 1.1/GFR 49 most recently in May  She is improved off calcium channel blocker and presently does not require loop diuretic her weights are stable we negotiated  she take it for weight gain of 4 pounds daily.  I having no lightheadedness chest pain shortness of breath palpitation or syncope.  Recently seen by nephrology and is establishing a relationship with endocrinology.  Tolerates her anticoagulant without bleeding complication Past Medical History:  Diagnosis Date   Acquired hypothyroidism 07/07/2016   Acute gastritis 07/07/2016   Allergic rhinitis 07/07/2016   Anemia 07/07/2016   Anxiety 07/07/2016   Arthritis    Osteoarthritis   ATRIAL FIBRILLATION 11/27/2008   Qualifier: Diagnosis of  By: Genevie Cheshire PharmD, Sally     Atypical chest pain 07/07/2016   Benign essential hypertension 07/07/2016   Overview:  stable on current meds   Benign paroxysmal positional vertigo 07/07/2016   Overview:  Avoid rapid headmovement and position change. May taper the gabapentin to see if helps.   Bilateral low back pain without sciatica 07/07/2016   Overview:  continue gabapentin and meloxicam,   Bimalleolar ankle fracture 02/21/2014   Bronchitis 07/07/2016   Carpal tunnel syndrome    CHF (congestive heart failure) ([place])    08-2013   Chronic anticoagulation 12/03/2014   Chronic diastolic heart failure ([place]) 12/03/2014   CKD (chronic kidney disease) stage 3, GFR 30-59 ml/min 11/21/2014   COPD (chronic obstructive pulmonary disease) ([place]) 07/07/2016   DM2 (diabetes mellitus, type 2) ([facility])    Edema, peripheral 07/07/2016   Overview:  increase lasix to 40 mg bid   Enthesopathy 07/07/2016   Esophagitis, reflux 07/07/2016   Essential hypertension 05/06/2015   Functional diarrhea  07/07/2016   GERD (gastroesophageal reflux disease)    Headache 07/07/2016   Heart failure ([place])    History of diverticulosis 07/07/2016   HLD (hyperlipidemia)    HTN (hypertension)    Hyperlipidemia 05/06/2015   Hypertensive heart disease with heart failure ([place]) 12/03/2014   Hypothyroid 07/07/2016   Hypothyroidism    Irritable bowel syndrome 07/07/2016   Overview:  IBS  information sheet provided,  dietary changes suggested   Low back pain 07/07/2016   Overview:  [facility]   Major depressive disorder, recurrent, moderate ([place]) 07/07/2016   Microalbuminuria due to type 2 diabetes mellitus ([place]) 05/06/2015   Midline low back pain without sciatica 07/07/2016   Overview:  --RTC for warning signs (Fevers, chills, loss of bowel/bladder function, saddle numbness), worsening pain or failure for pain to improve with symptomatic treatment --reviewed natural course of disease that acute LBP takes 4-6 weeks to recover --cont meds listed for symptomatic relief --follow up in 2-4 weeks or earlier if warning signs develop   Nerve pain 07/07/2016   Obesity    Obesity (BMI 30-39.9) 05/06/2015   OSA (obstructive sleep apnea) 07/07/2016   Osteoarthritis 07/07/2016   Other ventral hernia 07/07/2016   Overweight 07/07/2016   Pacemaker 11/21/2014   PAF (paroxysmal atrial fibrillation) ([place])    Presence of permanent cardiac pacemaker    09-24-13 01-21-14 Last interrogation in Epic at [facility]   Rhinosinusitis 07/07/2016   Seasonal allergies 07/07/2016   Sleep apnea    No cpap used   Smoker 07/07/2016   SSS (sick sinus syndrome) ([place]) 11/21/2014   Symptomatic menopausal or female climacteric states 07/07/2016   Thoracic back pain 07/07/2016   Type II or unspecified type diabetes mellitus with renal manifestations, uncontrolled 07/07/2016    Past Surgical History:  Procedure Laterality Date   BREAST LUMPECTOMY WITH RADIOACTIVE SEED LOCALIZATION Right 08/02/2017   Procedure: BREAST LUMPECTOMY WITH RADIOACTIVE SEED LOCALIZATION;  Surgeon: Erroll Luna, MD;  Location: [facility];  Service: General;  Laterality: Right;   CARPAL TUNNEL RELEASE     CHOLECYSTECTOMY     HERNIA REPAIR     ORIF ANKLE FRACTURE Left 02/21/2014   Procedure: OPEN REDUCTION INTERNAL FIXATION (ORIF) LEFT  ANKLE FRACTURE;  Surgeon: Tobi Bastos, MD;  Location: [facility];   Service: Orthopedics;  Laterality: Left;   PACEMAKER INSERTION  09-24-13   TOTAL HIP ARTHROPLASTY     TUBAL LIGATION      Current Medications: Current Meds  Medication Sig   albuterol (PROVENTIL HFA;VENTOLIN HFA) 108 (90 Base) MCG/ACT inhaler Inhale 2 puffs into the lungs every 6 (six) hours as needed for wheezing or shortness of breath.   atorvastatin (LIPITOR) 20 MG tablet Take 1 tablet (20 mg total) by mouth at bedtime.   ELIQUIS 5 MG TABS tablet TAKE 1 TABLET BY MOUTH TWICE A DAY   escitalopram (LEXAPRO) 20 MG tablet Take 20 mg by mouth daily.     fluticasone (FLONASE) 50 MCG/ACT nasal spray SPRAY 1 SPRAY INTO EACH NOSTRIL EVERY DAY   gabapentin (NEURONTIN) 300 MG capsule Take 1 capsule by mouth 3 (three) times daily.   glucose blood (PRECISION QID TEST) test strip 1 strip by Misc.(Non-Drug; Combo Route) route 2 times daily.   insulin detemir (LEVEMIR FLEXTOUCH) 100 UNIT/ML FlexPen Inject 60 Units into the skin daily.   Insulin Pen Needle (FIFTY50 PEN NEEDLES) 32G X 4 MM MISC Use pen needles as directed for diabetic medications   isosorbide mononitrate (IMDUR)  30 MG 24 hr tablet Take one tablet by mouth daily.   levothyroxine (SYNTHROID, LEVOTHROID) 150 MCG tablet Take 150 mcg by mouth daily before breakfast.    liraglutide (VICTOZA) 18 MG/3ML SOPN INJECT 1.8MG  SUBCUTANEOUSLY EVERY MORNING   metoprolol tartrate (LOPRESSOR) 100 MG tablet Take 0.5 tablets (50 mg total) by mouth 2 (two) times daily.   omega-3 acid ethyl esters (LOVAZA) 1 g capsule Take 2 capsules (2 g total) by mouth 2 (two) times daily.   SYMBICORT 160-4.5 MCG/ACT inhaler Inhale 2 puffs into the lungs 2 (two) times daily as needed for shortness of breath.   torsemide (DEMADEX) 20 MG tablet Take 20 mg by mouth daily as needed (for feet swelling).   Vitamin D, Ergocalciferol, (DRISDOL) 1.25 MG (50000 UT) CAPS capsule TAKE ONE CAPSULE BY MOUTH ONE TIME PER WEEK     Allergies:   Meperidine and  Hydrocodone-acetaminophen   Social History   Socioeconomic History   Marital status: Divorced    Spouse name: Not on file   Number of children: 2   Years of education: Not on file   Highest education level: Not on file  Occupational History   Occupation: [employer]  Tobacco Use   Smoking status: Former Smoker    Years: 2.00    Quit date: 11/25/2008    Years since quitting: 10.6   Smokeless tobacco: Never Used  Vaping Use   Vaping Use: Never used  Substance and Sexual Activity   Alcohol use: No   Drug use: No   Sexual activity: Not on file  Other Topics Concern   Not on file  Social History Narrative   Not on file   Social Determinants of Health   Financial Resource Strain:    Difficulty of Paying Living Expenses:   Food Insecurity:    Worried About Charity fundraiser in the Last Year:    Arboriculturist in the Last Year:   Transportation Needs:    Film/video editor (Medical):    Lack of Transportation (Non-Medical):   Physical Activity:    Days of Exercise per Week:    Minutes of Exercise per Session:   Stress:    Feeling of Stress :   Social Connections:    Frequency of Communication with Friends and Family:    Frequency of Social Gatherings with Friends and Family:    Attends Religious Services:    Active Member of Clubs or Organizations:    Attends Archivist Meetings:    Marital Status:      Family History: The patient's family history includes Diabetes in her brother and sister; Heart failure in an other family member. ROS:   Please see the history of present illness.    All other systems reviewed and are negative.  EKGs/Labs/Other Studies Reviewed:    The following studies were reviewed today:   Recent Labs:  Labs 06/04/2019 [facility] PCP: CMP shows a glucose of 233 BUN of 26 creatinine 1.15 GFR 49 cc potassium 4.7 Cholesterol 146 triglycerides 315 HDL 33 LDL direct 74 TSH  29.03 Hemoglobin A1c 11.6% CBC normal hemoglobin 14.2 09/07/2018: ALT 25; BUN 15; Creatinine, Ser 0.92; Hemoglobin 13.2; Platelets 232; Potassium 4.2; Sodium 139; TSH 5.940  Recent Lipid Panel    Component Value Date/Time   CHOL 150 09/07/2018 0919   TRIG 427 (H) 09/07/2018 0919   HDL 31 (L) 09/07/2018 0919   CHOLHDL 4.8 (H) 09/07/2018 0919   [place] Comment 09/07/2018 0919  Physical Exam:    VS:  BP 120/70 (BP Location: Right Arm, Patient Position: Sitting, Cuff Size: Normal)    Pulse 87    Ht 5\' 3"  (1.6 m)    Wt 240 lb 3.2 oz (109 kg)    LMP 02/26/2005    SpO2 97%    BMI 42.55 kg/m     Wt Readings from Last 3 Encounters:  07/27/19 240 lb 3.2 oz (109 kg)  07/17/19 235 lb (106.6 kg)  06/19/19 235 lb (106.6 kg)     GEN:  Well nourished, well developed in no acute distress HEENT: Normal NECK: No JVD; No carotid bruits LYMPHATICS: No lymphadenopathy CARDIAC: RRR, no murmurs, rubs, gallops RESPIRATORY:  Clear to auscultation without rales, wheezing or rhonchi  ABDOMEN: Soft, non-tender, non-distended MUSCULOSKELETAL:  No edema; No deformity  SKIN: Warm and dry NEUROLOGIC:  Alert and oriented x 3 PSYCHIATRIC:  Normal affect    Signed, Shirlee More, MD  07/27/2019 1:23 PM    [facility]

## 2019-08-15 ENCOUNTER — Other Ambulatory Visit: Payer: Self-pay | Admitting: *Deleted

## 2019-08-15 MED ORDER — APIXABAN 5 MG PO TABS: 5.0000 mg | ORAL_TABLET | Freq: Two times a day (BID) | ORAL | 1 refills | Status: DC

## 2019-08-15 NOTE — Telephone Encounter (Signed)
*  STAT* If patient is at the pharmacy, call can be transferred to refill team.   1. Which medications need to be refilled? (please list name of each medication and dose if known) Eliquis 5 mg, Bid  2. Which pharmacy/location (including street and city if local pharmacy) is medication to be sent to? [facility]   3. Do they need a 30 day or 90 day supply? [place]

## 2019-08-15 NOTE — Telephone Encounter (Signed)
Prescription refill request for Eliquis received. Indication: Atrial Fibrillation Last office visit: 07/27/2019 Dr Bettina Gavia Scr: 0.92 09/07/2018 Age: 68 Weight: 109 kg  Prescription refilled

## 2019-08-21 DIAGNOSIS — N1831 Chronic kidney disease, stage 3a: Secondary | ICD-10-CM | POA: Diagnosis not present

## 2019-08-28 DIAGNOSIS — N1831 Chronic kidney disease, stage 3a: Secondary | ICD-10-CM | POA: Diagnosis not present

## 2019-08-28 DIAGNOSIS — Z794 Long term (current) use of insulin: Secondary | ICD-10-CM | POA: Diagnosis not present

## 2019-08-28 DIAGNOSIS — I129 Hypertensive chronic kidney disease with stage 1 through stage 4 chronic kidney disease, or unspecified chronic kidney disease: Secondary | ICD-10-CM | POA: Diagnosis not present

## 2019-08-28 DIAGNOSIS — E1122 Type 2 diabetes mellitus with diabetic chronic kidney disease: Secondary | ICD-10-CM | POA: Diagnosis not present

## 2019-08-28 DIAGNOSIS — E1165 Type 2 diabetes mellitus with hyperglycemia: Secondary | ICD-10-CM | POA: Diagnosis not present

## 2019-08-28 DIAGNOSIS — E039 Hypothyroidism, unspecified: Secondary | ICD-10-CM | POA: Diagnosis not present

## 2019-08-28 DIAGNOSIS — Z6841 Body Mass Index (BMI) 40.0 and over, adult: Secondary | ICD-10-CM | POA: Diagnosis not present

## 2019-09-01 ENCOUNTER — Other Ambulatory Visit: Payer: Self-pay | Admitting: Cardiology

## 2019-09-11 DIAGNOSIS — M1711 Unilateral primary osteoarthritis, right knee: Secondary | ICD-10-CM | POA: Diagnosis not present

## 2019-09-11 DIAGNOSIS — M25561 Pain in right knee: Secondary | ICD-10-CM | POA: Diagnosis not present

## 2019-09-11 DIAGNOSIS — J019 Acute sinusitis, unspecified: Secondary | ICD-10-CM | POA: Diagnosis not present

## 2019-09-19 ENCOUNTER — Telehealth: Payer: Self-pay | Admitting: Cardiology

## 2019-09-19 NOTE — Telephone Encounter (Signed)
Patient is calling to let Dr. Bettina Gavia know she had another episode today while out getting her car serviced. She states she felt like her legs felt like rubber, she got real dizzy, her head felt real funny and she states she almost fell but had no where to sit down so she had to lean up against the car. When she was finally able to get home to check her BP - it was 111/60 while sitting and 124/72 while standing.

## 2019-09-20 DIAGNOSIS — M1711 Unilateral primary osteoarthritis, right knee: Secondary | ICD-10-CM | POA: Diagnosis not present

## 2019-09-20 DIAGNOSIS — M25561 Pain in right knee: Secondary | ICD-10-CM

## 2019-09-20 HISTORY — DX: Pain in right knee: M25.561

## 2019-09-20 MED ORDER — MIDODRINE HCL 5 MG PO TABS: 5.0000 mg | ORAL_TABLET | Freq: Three times a day (TID) | ORAL | 3 refills | Status: DC

## 2019-09-20 NOTE — Telephone Encounter (Signed)
I reviewed my office note lets initiate her on midodrine 5 mg 3 times daily  Record her blood pressure sitting standing twice a day  See me in the office 2 to 3 weeks

## 2019-09-20 NOTE — Telephone Encounter (Signed)
Spoke with patient just now and let her know Dr. Joya Gaskins recommendations. She verbalizes understanding and thanks me for the call back.

## 2019-09-26 DIAGNOSIS — G4730 Sleep apnea, unspecified: Secondary | ICD-10-CM | POA: Insufficient documentation

## 2019-09-26 DIAGNOSIS — I509 Heart failure, unspecified: Secondary | ICD-10-CM | POA: Insufficient documentation

## 2019-09-26 DIAGNOSIS — Z950 Presence of cardiac pacemaker: Secondary | ICD-10-CM | POA: Insufficient documentation

## 2019-09-26 DIAGNOSIS — E669 Obesity, unspecified: Secondary | ICD-10-CM | POA: Insufficient documentation

## 2019-09-26 DIAGNOSIS — I480 Paroxysmal atrial fibrillation: Secondary | ICD-10-CM | POA: Insufficient documentation

## 2019-09-26 DIAGNOSIS — E119 Type 2 diabetes mellitus without complications: Secondary | ICD-10-CM | POA: Insufficient documentation

## 2019-09-26 DIAGNOSIS — M1990 Unspecified osteoarthritis, unspecified site: Secondary | ICD-10-CM | POA: Insufficient documentation

## 2019-09-28 ENCOUNTER — Encounter: Payer: Self-pay | Admitting: Cardiology

## 2019-09-28 ENCOUNTER — Ambulatory Visit: Payer: PPO | Admitting: Cardiology

## 2019-09-28 ENCOUNTER — Other Ambulatory Visit: Payer: Self-pay

## 2019-09-28 VITALS — BP 109/71 | HR 64 | Ht 63.5 in | Wt 233.0 lb

## 2019-09-28 DIAGNOSIS — I480 Paroxysmal atrial fibrillation: Secondary | ICD-10-CM | POA: Diagnosis not present

## 2019-09-28 DIAGNOSIS — I951 Orthostatic hypotension: Secondary | ICD-10-CM

## 2019-09-28 DIAGNOSIS — Z7901 Long term (current) use of anticoagulants: Secondary | ICD-10-CM | POA: Diagnosis not present

## 2019-09-28 DIAGNOSIS — I495 Sick sinus syndrome: Secondary | ICD-10-CM | POA: Diagnosis not present

## 2019-09-28 DIAGNOSIS — I110 Hypertensive heart disease with heart failure: Secondary | ICD-10-CM

## 2019-09-28 NOTE — Patient Instructions (Addendum)

## 2019-09-28 NOTE — Progress Notes (Signed)
Cardiology Office Note:    Date:  09/28/2019   ID:  Carolyn [name], DOB 1950-11-24, MRN [id number]  PCP:  Carolyn Broker, MD  Cardiologist:  Carolyn More, MD    Referring MD: Carolyn Broker, MD    ASSESSMENT:    1. Orthostatic hypotension   2. Hypertensive heart disease with heart failure ([place])   3. SSS (sick sinus syndrome) (HCC)   4. Paroxysmal atrial fibrillation ([place])   5. Chronic anticoagulation    PLAN:    In order of problems listed above:  1. Fortunately she is improved I asked her to consider using abdominal binder with her diabetes and neuropathy continue her current dose of midodrine 2. Stable pacemaker function device download checked she remains anticoagulated and beta-blocker for atrial fibrillation   Next appointment: 3 months   Medication Adjustments/Labs and Tests Ordered: Current medicines are reviewed at length with the patient today.  Concerns regarding medicines are outlined above.  No orders of the defined types were placed in this encounter.  No orders of the defined types were placed in this encounter.   Chief Complaint  Patient presents with  . Follow-up    After starting midodrine for orthostatic hypotension  . Hypotension    History of Present Illness:    Carolyn [name] is a 69 y.o. female with a hx of PAF chadsvasc score of  4 (cryoablation Dec 2017, maintained on Multaq until July 2018), HTN, DM 2, history of hyperthyroidism that was treated now on suppressant therapy, dual-chamber PPM for sinus node dysfunction heart failure stage III CKD type 2 diabetes  last seen 06/19/2019 with symptomatic orthostatic hypotension  last seen by me 07/27/2019.  Last pacemaker check 07/11/2019 had brief episodes of atrial fibrillation rapid rate Compliance with diet, lifestyle and medications: Yes  She is diabetic and is having a real problem with orthostatic hypotension couple episodes of near fainting and checking home blood pressures at home  she has had systolics of 338.  I decided to treat her and placed her on midodrine a week ago and she is improved.  No further episodes.  She is hesitant to wear support hose with hot humid weather but she has an abdominal binder at home and said she would resume using it.  Fortunately she has a pacemaker and we do not need to worry about arrhythmia as the cause. Past Medical History:  Diagnosis Date  . Acquired hypothyroidism 07/07/2016  . Acute gastritis 07/07/2016  . Acute pain of right knee 09/20/2019  . Allergic rhinitis 07/07/2016  . Anemia 07/07/2016  . Anxiety 07/07/2016  . Arthritis    Osteoarthritis  . ATRIAL FIBRILLATION 11/27/2008   Qualifier: Diagnosis of  By: Genevie Cheshire PharmD, Gay Filler    . Atypical chest pain 07/07/2016  . Benign essential hypertension 07/07/2016   Overview:  stable on current meds  . Benign paroxysmal positional vertigo 07/07/2016   Overview:  Avoid rapid headmovement and position change. May taper the gabapentin to see if helps.  . Bilateral low back pain without sciatica 07/07/2016   Overview:  continue gabapentin and meloxicam,  . Bimalleolar ankle fracture 02/21/2014  . Bronchitis 07/07/2016  . Carpal tunnel syndrome   . CHF (congestive heart failure) ([place])    [id number]  . Chronic anticoagulation 12/03/2014  . Chronic diastolic heart failure ([place]) 12/03/2014  . CKD (chronic kidney disease) stage 3, GFR 30-59 ml/min 11/21/2014  . COPD (chronic obstructive pulmonary disease) ([place]) 07/07/2016  . DM2 (diabetes mellitus, type 2) (  [place])   . Edema, peripheral 07/07/2016   Overview:  increase lasix to 40 mg bid  . Enthesopathy 07/07/2016  . Esophagitis, reflux 07/07/2016  . Essential hypertension 05/06/2015  . Functional diarrhea 07/07/2016  . GERD (gastroesophageal reflux disease)   . Headache 07/07/2016  . Heart failure ([place])   . History of diverticulosis 07/07/2016  . HLD (hyperlipidemia)   . HTN (hypertension)   . Hyperlipidemia 05/06/2015  . Hypertensive heart disease  07/07/2016   Overview:  stable on current meds  . Hypertensive heart disease with heart failure ([place]) 12/03/2014  . Hypothyroid 07/07/2016  . Hypothyroidism   . Irritable bowel syndrome 07/07/2016   Overview:  IBS information sheet provided,  dietary changes suggested  . Low back pain 07/07/2016   Overview:  [facility]  . Major depressive disorder, recurrent, moderate ([place]) 07/07/2016  . Microalbuminuria due to type 2 diabetes mellitus ([place]) 05/06/2015  . Midline low back pain without sciatica 07/07/2016   Overview:  --RTC for warning signs (Fevers, chills, loss of bowel/bladder function, saddle numbness), worsening pain or failure for pain to improve with symptomatic treatment --reviewed natural course of disease that acute LBP takes 4-6 weeks to recover --cont meds listed for symptomatic relief --follow up in 2-4 weeks or earlier if warning signs develop  . Mixed dyslipidemia 03/17/2019  . Nerve pain 07/07/2016  . Obesity   . Obesity (BMI 30-39.9) 05/06/2015  . OSA (obstructive sleep apnea) 07/07/2016  . Osteoarthritis 07/07/2016  . Other ventral hernia 07/07/2016  . Overweight 07/07/2016  . Pacemaker 11/21/2014  . PAF (paroxysmal atrial fibrillation) ([place])   . Paroxysmal atrial fibrillation ([place]) 11/27/2008   CHADS2 vasc=4   . Presence of permanent cardiac pacemaker    09-24-13 01-21-14 Last interrogation in Epic at [facility]  . Rhinosinusitis 07/07/2016  . Seasonal allergies 07/07/2016  . Shortness of breath 04/11/2019  . Sleep apnea    No cpap used  . Smoker 07/07/2016  . SSS (sick sinus syndrome) ([place]) 11/21/2014  . Symptomatic menopausal or female climacteric states 07/07/2016  . Thoracic back pain 07/07/2016  . Type II or unspecified type diabetes mellitus with renal manifestations, uncontrolled 07/07/2016    Past Surgical History:  Procedure Laterality Date  . BREAST LUMPECTOMY WITH RADIOACTIVE SEED LOCALIZATION Right 08/02/2017   Procedure: BREAST LUMPECTOMY WITH  RADIOACTIVE SEED LOCALIZATION;  Surgeon: Erroll Luna, MD;  Location: [facility];  Service: General;  Laterality: Right;  . CARPAL TUNNEL RELEASE    . CHOLECYSTECTOMY    . HERNIA REPAIR    . ORIF ANKLE FRACTURE Left 02/21/2014   Procedure: OPEN REDUCTION INTERNAL FIXATION (ORIF) LEFT  ANKLE FRACTURE;  Surgeon: Tobi Bastos, MD;  [facility];  Service: Orthopedics;  Laterality: Left;  . PACEMAKER INSERTION  09-24-13  . TOTAL HIP ARTHROPLASTY    . TUBAL LIGATION      Current Medications: Current Meds  Medication Sig  . albuterol (PROVENTIL HFA;VENTOLIN HFA) 108 (90 Base) MCG/ACT inhaler Inhale 2 puffs into the lungs every 6 (six) hours as needed for wheezing or shortness of breath.  [place] Kitchen apixaban (ELIQUIS) 5 MG TABS tablet Take 1 tablet (5 mg total) by mouth 2 (two) times daily.  [place] Kitchen atorvastatin (LIPITOR) 20 MG tablet TAKE 1 TABLET BY MOUTH EVERYDAY AT BEDTIME  . empagliflozin (JARDIANCE) 10 MG TABS tablet Take 10 mg by mouth daily.  [place] Kitchen escitalopram (LEXAPRO) 20 MG tablet Take 20 mg by mouth daily.    . fluticasone (FLONASE)  50 MCG/ACT nasal spray SPRAY 1 SPRAY INTO EACH NOSTRIL EVERY DAY  . gabapentin (NEURONTIN) 300 MG capsule Take 1 capsule by mouth 3 (three) times daily.  [place] Kitchen glucose blood (PRECISION QID TEST) test strip 1 strip by Misc.(Non-Drug; Combo Route) route 2 times daily.  . insulin detemir (LEVEMIR FLEXTOUCH) 100 UNIT/ML FlexPen Inject 60 Units into the skin daily.  . Insulin Pen Needle (FIFTY50 PEN NEEDLES) 32G X 4 MM MISC Use pen needles as directed for diabetic medications  . isosorbide mononitrate (IMDUR) 30 MG 24 hr tablet Take one tablet by mouth daily.  [place] Kitchen levothyroxine (SYNTHROID) 175 MCG tablet Take 175 mcg by mouth every morning.  . metoprolol tartrate (LOPRESSOR) 100 MG tablet Take 0.5 tablets (50 mg total) by mouth 2 (two) times daily.  . midodrine (PROAMATINE) 5 MG tablet Take 1 tablet (5 mg total) by mouth 3 (three) times daily with meals.  Mar[place] Kitchen omega-3 acid ethyl  esters (LOVAZA) 1 g capsule Take 2 capsules (2 g total) by mouth 2 (two) times daily.  . Semaglutide, 1 MG/DOSE, (OZEMPIC, 1 MG/DOSE,) 4 MG/3ML SOPN Inject 1 mg into the skin once a week.  . SYMBICORT 160-4.5 MCG/ACT inhaler Inhale 2 puffs into the lungs 2 (two) times daily as needed for shortness of breath.  . Vitamin D, Ergocalciferol, (DRISDOL) 1.25 MG (50000 UT) CAPS capsule TAKE ONE CAPSULE BY MOUTH ONE TIME PER WEEK     Allergies:   Meperidine and Hydrocodone-acetaminophen   Social History   Socioeconomic History  . Marital status: Divorced    Spouse name: Not on file  . Number of children: 2  . Years of education: Not on file  . Highest education level: Not on file  Occupational History  . Occupation: [employer]  Tobacco Use  . Smoking status: Former Smoker    Years: 2.00    Quit date: 11/25/2008    Years since quitting: 10.8  . Smokeless tobacco: Never Used  Vaping Use  . Vaping Use: Never used  Substance and Sexual Activity  . Alcohol use: No  . Drug use: No  . Sexual activity: Not on file  Other Topics Concern  . Not on file  Social History Narrative  . Not on file   Social Determinants of Health   Financial Resource Strain:   . Difficulty of Paying Living Expenses: Not on file  Food Insecurity:   . Worried About Charity fundraiser in the Last Year: Not on file  . Ran Out of Food in the Last Year: Not on file  Transportation Needs:   . Lack of Transportation (Medical): Not on file  . Lack of Transportation (Non-Medical): Not on file  Physical Activity:   . Days of Exercise per Week: Not on file  . Minutes of Exercise per Session: Not on file  Stress:   . Feeling of Stress : Not on file  Social Connections:   . Frequency of Communication with Friends and Family: Not on file  . Frequency of Social Gatherings with Friends and Family: Not on file  . Attends Religious Services: Not on file  . Active Member of Clubs or Organizations: Not on file  .  Attends Archivist Meetings: Not on file  . Marital Status: Not on file     Family History: The patient's family history includes Diabetes in her brother and sister; Heart failure in an other family member. ROS:   Please see the history of present illness.    All  other systems reviewed and are negative.  EKGs/Labs/Other Studies Reviewed:    The following studies were reviewed today:    Recent Labs: No results found for requested labs within last 8760 hours.  Recent Lipid Panel    Component Value Date/Time   CHOL 150 09/07/2018 0919   TRIG 427 (H) 09/07/2018 0919   HDL 31 (L) 09/07/2018 0919   CHOLHDL 4.8 (H) 09/07/2018 0919   [facility] Comment 09/07/2018 0919    Physical Exam:    VS:  BP 109/71   Pulse 64   Ht 5' 3.5" (1.613 m)   Wt 233 lb (105.7 kg)   LMP 02/26/2005   SpO2 97%   BMI 40.63 kg/m     Wt Readings from Last 3 Encounters:  09/28/19 233 lb (105.7 kg)  07/27/19 240 lb 3.2 oz (109 kg)  07/17/19 235 lb (106.6 kg)     GEN:  Well nourished, well developed in no acute distress HEENT: Normal NECK: No JVD; No carotid bruits LYMPHATICS: No lymphadenopathy CARDIAC: RRR, no murmurs, rubs, gallops RESPIRATORY:  Clear to auscultation without rales, wheezing or rhonchi  ABDOMEN: Soft, non-tender, non-distended MUSCULOSKELETAL:  No edema; No deformity  SKIN: Warm and dry NEUROLOGIC:  Alert and oriented x 3 PSYCHIATRIC:  Normal affect    Signed, Carolyn More, MD  09/28/2019 11:32 AM    [place]

## 2019-10-08 ENCOUNTER — Ambulatory Visit (INDEPENDENT_AMBULATORY_CARE_PROVIDER_SITE_OTHER): Payer: PPO | Admitting: *Deleted

## 2019-10-08 DIAGNOSIS — I495 Sick sinus syndrome: Secondary | ICD-10-CM

## 2019-10-09 LAB — CUP PACEART REMOTE DEVICE CHECK
Battery Impedance: 258 Ohm
Battery Remaining Longevity: 138 mo
Battery Voltage: 2.79 V
Brady Statistic AP VP Percent: 1 %
Brady Statistic AP VS Percent: 16 %
Brady Statistic AS VP Percent: 0 %
Brady Statistic AS VS Percent: 84 %
Date Time Interrogation Session: 20210913083247
Implantable Lead Implant Date: 20150831
Implantable Lead Implant Date: 20150831
Implantable Lead Location: 753859
Implantable Lead Location: 753860
Implantable Lead Model: 4092
Implantable Lead Model: 5076
Implantable Pulse Generator Implant Date: 20150831
Lead Channel Impedance Value: 618 Ohm
Lead Channel Impedance Value: 633 Ohm
Lead Channel Pacing Threshold Amplitude: 0.625 V
Lead Channel Pacing Threshold Amplitude: 0.625 V
Lead Channel Pacing Threshold Pulse Width: 0.4 ms
Lead Channel Pacing Threshold Pulse Width: 0.4 ms
Lead Channel Setting Pacing Amplitude: 1.5 V
Lead Channel Setting Pacing Amplitude: 2 V
Lead Channel Setting Pacing Pulse Width: 0.4 ms
Lead Channel Setting Sensing Sensitivity: 2.8 mV

## 2019-10-10 NOTE — Progress Notes (Signed)
Remote pacemaker transmission.   

## 2019-10-29 DIAGNOSIS — R52 Pain, unspecified: Secondary | ICD-10-CM | POA: Diagnosis not present

## 2019-10-29 DIAGNOSIS — M25561 Pain in right knee: Secondary | ICD-10-CM | POA: Diagnosis not present

## 2019-10-29 DIAGNOSIS — S29019A Strain of muscle and tendon of unspecified wall of thorax, initial encounter: Secondary | ICD-10-CM | POA: Diagnosis not present

## 2019-10-30 DIAGNOSIS — R52 Pain, unspecified: Secondary | ICD-10-CM | POA: Diagnosis not present

## 2019-10-30 DIAGNOSIS — Z23 Encounter for immunization: Secondary | ICD-10-CM | POA: Diagnosis not present

## 2019-10-31 ENCOUNTER — Encounter: Payer: Self-pay | Admitting: Cardiology

## 2019-11-02 IMAGING — MG MM PLC BREAST LOC DEV 1ST LESION INC*R*
8 of 15 series · 8 of 35 positions shown · non-contrast
Comparison: Previous exam(s).

CLINICAL DATA: 67-year-old female presenting for radioactive seed
localization of the right breast prior to excisional biopsy.

EXAM:
MAMMOGRAPHIC GUIDED RADIOACTIVE SEED LOCALIZATION OF THE RIGHT
BREAST

[R CC (1 of 3)]
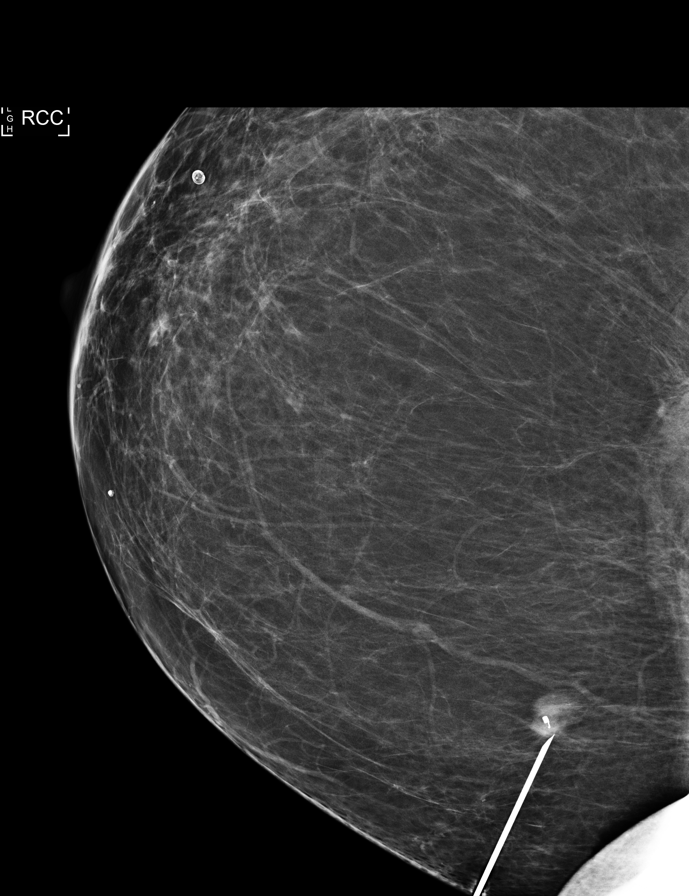

[R CC (2 of 3)]
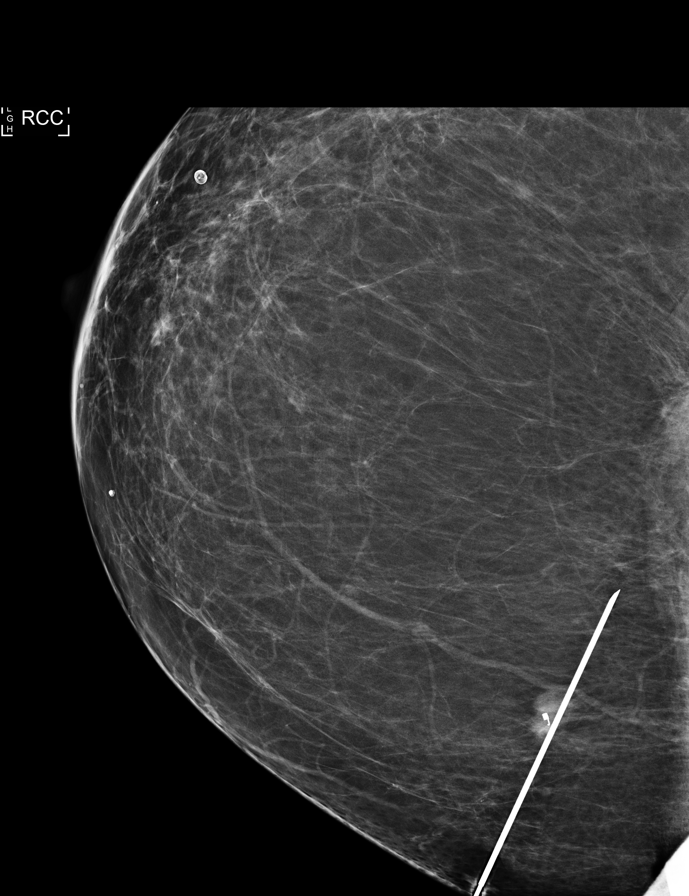

[R CC (3 of 3)]
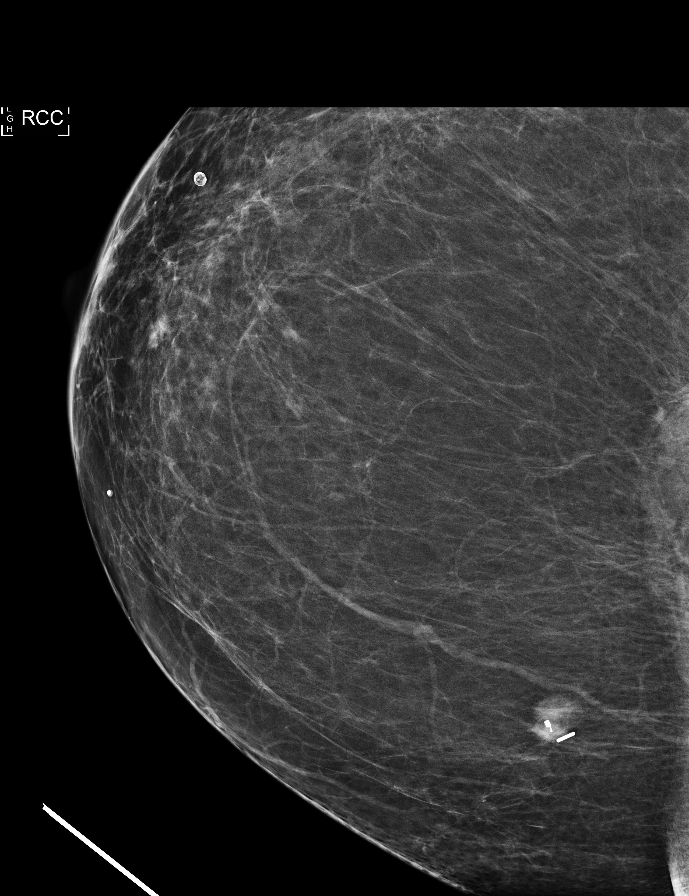

[R ML (1 of 2)]
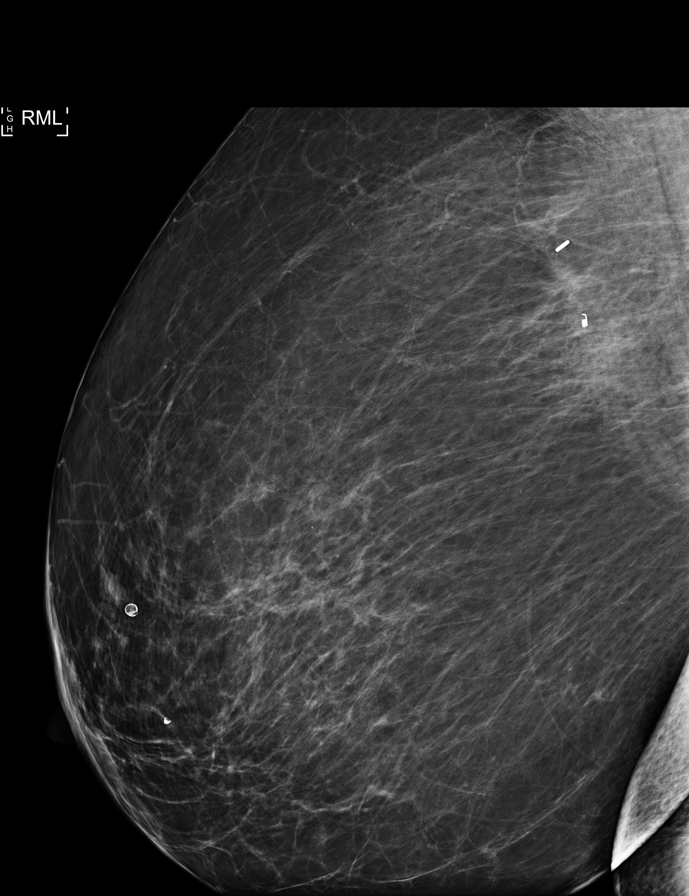

[R ML (2 of 2)]
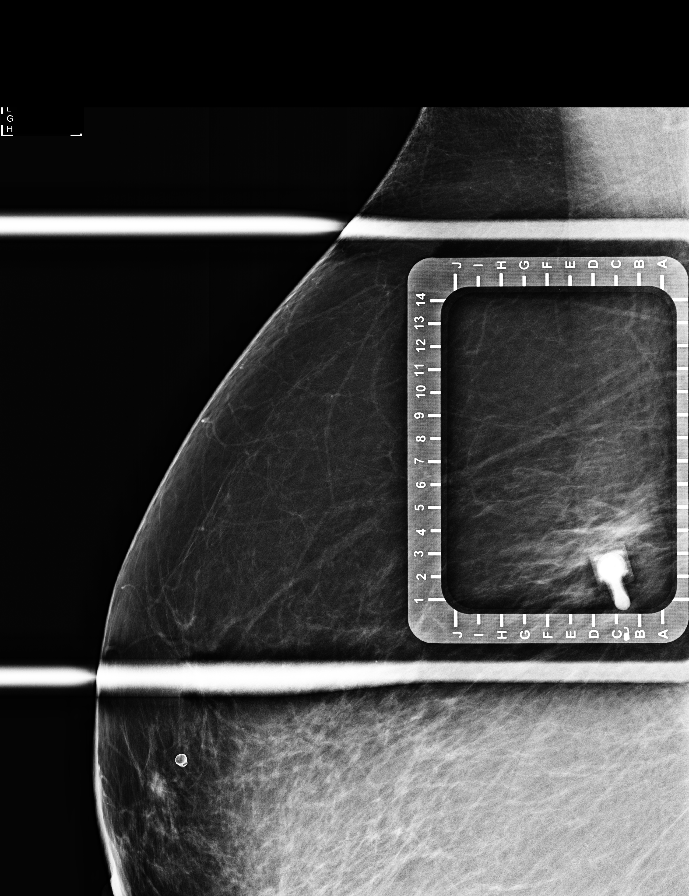

[R CC synth-2D (1 of 2)]
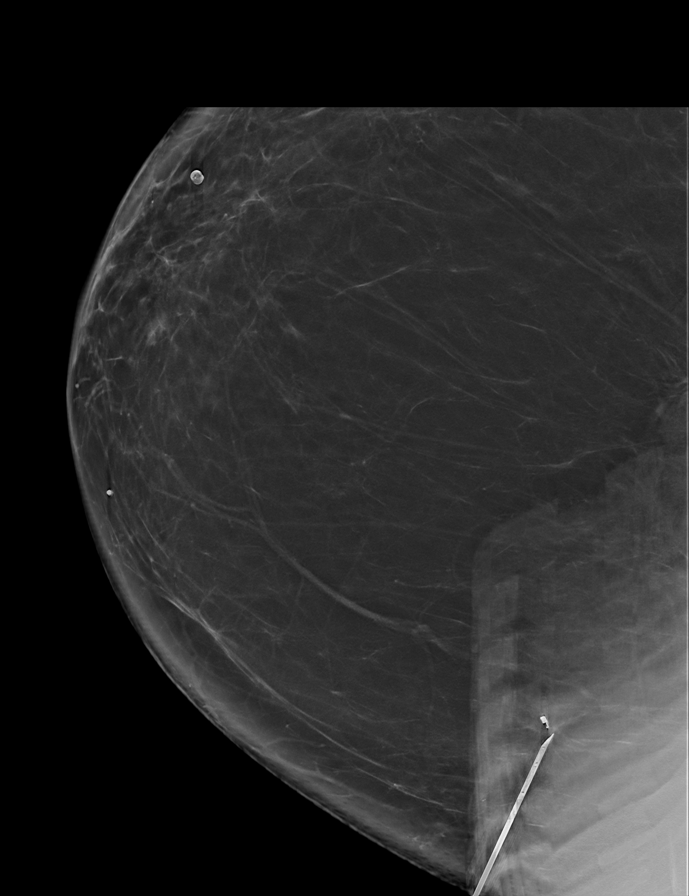

[R ML synth-2D]
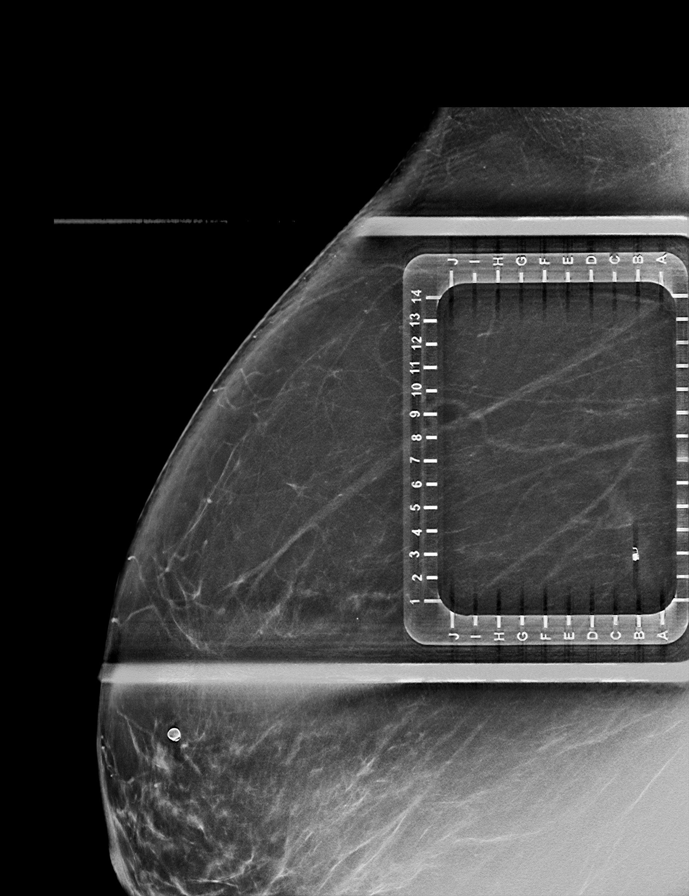

[R CC synth-2D (2 of 2)]
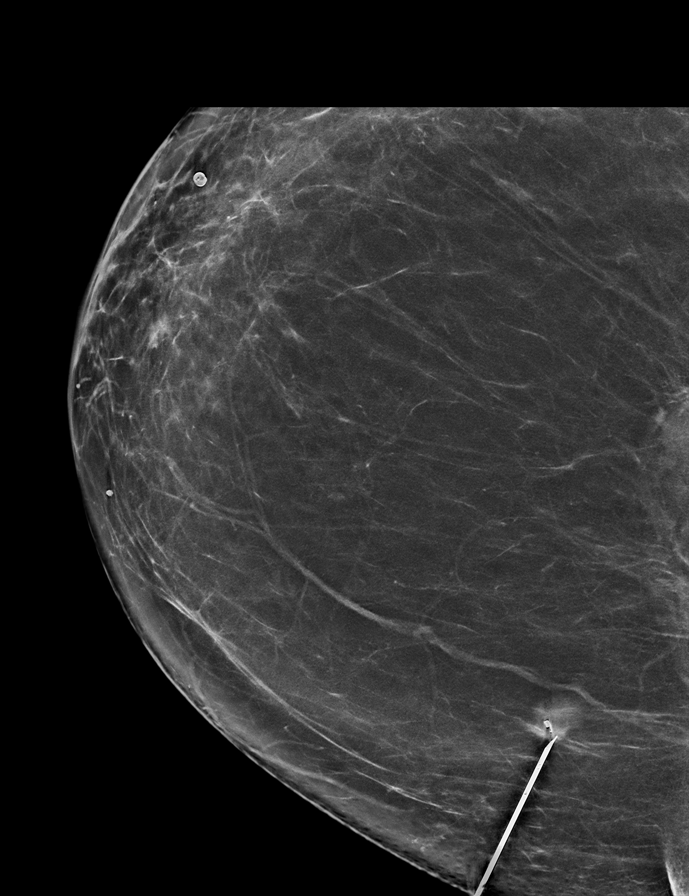

[8 of 35 positions shown; findings below may reference images not displayed]

FINDINGS: Patient presents for radioactive seed localization prior to
excisional biopsy. I met with the patient and we discussed the
procedure of seed localization including benefits and alternatives.
We discussed the high likelihood of a successful procedure. We
discussed the risks of the procedure including infection, bleeding,
tissue injury and further surgery. We discussed the low dose of
radioactivity involved in the procedure. Informed, written consent
was given.

The usual time-out protocol was performed immediately prior to the
procedure.

Using mammographic guidance, sterile technique, 1% lidocaine and an
[id number] radioactive seed, the lesion in the upper inner quadrant of
the right breast was localized using a medial approach. Note that
the biopsy marking clip was 3 cm inferiorly displaced at the time of
biopsy, and therefore the lesion itself was localized. The follow-up
mammogram images confirm the seed in the expected location and were
marked for Dr. Crider.

Follow-up survey of the patient confirms presence of the radioactive
seed.

Order number of Y-[id number] seed:  [id number].

Total activity:  0.257 millicuries reference Date: 07/15/2017

The patient tolerated the procedure well and was released from the
[REDACTED]. She was given instructions regarding seed removal.
IMPRESSION: Radioactive seed localization right breast. No apparent
complications.

## 2019-11-09 DIAGNOSIS — S29019A Strain of muscle and tendon of unspecified wall of thorax, initial encounter: Secondary | ICD-10-CM | POA: Diagnosis not present

## 2019-11-09 DIAGNOSIS — M25561 Pain in right knee: Secondary | ICD-10-CM | POA: Diagnosis not present

## 2019-11-12 ENCOUNTER — Other Ambulatory Visit: Payer: Self-pay | Admitting: Cardiology

## 2019-11-14 DIAGNOSIS — Z6841 Body Mass Index (BMI) 40.0 and over, adult: Secondary | ICD-10-CM | POA: Diagnosis not present

## 2019-11-14 DIAGNOSIS — E1129 Type 2 diabetes mellitus with other diabetic kidney complication: Secondary | ICD-10-CM | POA: Diagnosis not present

## 2019-11-14 DIAGNOSIS — E039 Hypothyroidism, unspecified: Secondary | ICD-10-CM | POA: Diagnosis not present

## 2019-11-14 DIAGNOSIS — E1165 Type 2 diabetes mellitus with hyperglycemia: Secondary | ICD-10-CM | POA: Diagnosis not present

## 2019-11-14 DIAGNOSIS — Z794 Long term (current) use of insulin: Secondary | ICD-10-CM | POA: Diagnosis not present

## 2019-11-14 DIAGNOSIS — R809 Proteinuria, unspecified: Secondary | ICD-10-CM | POA: Diagnosis not present

## 2019-11-14 DIAGNOSIS — N1831 Chronic kidney disease, stage 3a: Secondary | ICD-10-CM | POA: Diagnosis not present

## 2019-11-14 DIAGNOSIS — E1122 Type 2 diabetes mellitus with diabetic chronic kidney disease: Secondary | ICD-10-CM | POA: Diagnosis not present

## 2019-12-19 DIAGNOSIS — I509 Heart failure, unspecified: Secondary | ICD-10-CM | POA: Insufficient documentation

## 2019-12-19 DIAGNOSIS — E039 Hypothyroidism, unspecified: Secondary | ICD-10-CM | POA: Insufficient documentation

## 2019-12-19 DIAGNOSIS — I10 Essential (primary) hypertension: Secondary | ICD-10-CM | POA: Insufficient documentation

## 2019-12-19 DIAGNOSIS — E785 Hyperlipidemia, unspecified: Secondary | ICD-10-CM | POA: Insufficient documentation

## 2019-12-28 ENCOUNTER — Other Ambulatory Visit: Payer: Self-pay

## 2019-12-28 ENCOUNTER — Encounter: Payer: Self-pay | Admitting: Cardiology

## 2019-12-28 ENCOUNTER — Ambulatory Visit: Payer: PPO | Admitting: Cardiology

## 2019-12-28 VITALS — BP 102/64 | HR 72 | Ht 63.5 in | Wt 229.4 lb

## 2019-12-28 DIAGNOSIS — Z950 Presence of cardiac pacemaker: Secondary | ICD-10-CM | POA: Diagnosis not present

## 2019-12-28 DIAGNOSIS — I110 Hypertensive heart disease with heart failure: Secondary | ICD-10-CM | POA: Diagnosis not present

## 2019-12-28 DIAGNOSIS — Z7901 Long term (current) use of anticoagulants: Secondary | ICD-10-CM | POA: Diagnosis not present

## 2019-12-28 DIAGNOSIS — I480 Paroxysmal atrial fibrillation: Secondary | ICD-10-CM | POA: Diagnosis not present

## 2019-12-28 DIAGNOSIS — E782 Mixed hyperlipidemia: Secondary | ICD-10-CM

## 2019-12-28 DIAGNOSIS — I2510 Atherosclerotic heart disease of native coronary artery without angina pectoris: Secondary | ICD-10-CM

## 2019-12-28 DIAGNOSIS — I951 Orthostatic hypotension: Secondary | ICD-10-CM | POA: Diagnosis not present

## 2019-12-28 MED ORDER — APIXABAN 5 MG PO TABS: 5.0000 mg | ORAL_TABLET | Freq: Two times a day (BID) | ORAL | 3 refills | Status: DC

## 2019-12-28 NOTE — Progress Notes (Signed)
Cardiology Office Note:    Date:  12/28/2019   ID:  Carolyn [name], DOB 06-20-1950, MRN [id number]  PCP:  Myrlene Broker, MD  Cardiologist:  Shirlee More, MD    Referring MD: Myrlene Broker, MD    ASSESSMENT:    1. Orthostatic hypotension   2. Hypertensive heart disease with heart failure (HCC)   3. Paroxysmal atrial fibrillation ([place])   4. Chronic anticoagulation   5. Pacemaker   6. Mild CAD   7. Mixed hyperlipidemia    PLAN:    In order of problems listed above:  1. She is improved has no symptomatic orthostatic hypotension on midodrine and her congestive heart failure is compensated.  Continue current treatment 2. Stable atrial fibrillation continue anticoagulation is not an antiarrhythmic drug 3. Stable pacemaker function, she is rarely ventricularly paced and I do not see a problem with the modalities that are planned for neuropathy. 4. Stable CAD no anginal discomfort continue medical therapy including oral nitrate beta-blocker and high intensity statin 5. Strable  LDL at target continue treatment with high intensity statin fish oil 6. Type 2 diabetes managed by endocrinology   Next appointment: 6 months   Medication Adjustments/Labs and Tests Ordered: Current medicines are reviewed at length with the patient today.  Concerns regarding medicines are outlined above.  No orders of the defined types were placed in this encounter.  Meds ordered this encounter  Medications  . apixaban (ELIQUIS) 5 MG TABS tablet    Sig: Take 1 tablet (5 mg total) by mouth 2 (two) times daily.    Dispense:  180 tablet    Refill:  3    Chief Complaint  Patient presents with  . Follow-up  . Hypotension    History of Present Illness:    Carolyn [name] is a 69 y.o. female with a hx of paroxysmal atrial fibrillation with cryoablation in 2017 maintained on Multaq until July 2018 hypertension type 2 diabetes history of hyperthyroidism and sinus node dysfunction with  dual-chamber pacemaker.  Other problems include heart failure stage III CKD and symptomatic orthostatic hypotension.  She was last seen 09/28/2019.  Compliance with diet, lifestyle and medications: Yes  Her last device check 10/08/2019 showed AF burden of 0.2% otherwise normal function and parameters.  She is ventricularly paced less than 1% of the time  In general she feels much better she has had no orthostatic episodes but does not track her blood pressure at home.  Her biggest problem is neuropathy and frequent falls and she is seeing a Public affairs consultant in [place] and it sounds like she is going to have electric stimulatory therapy.  She is concerned about pacemaker interaction.  I told her I do not think it is a problem and she said that the physician will be contacting me.  She has had no bleeding from her anticoagulant she tolerates her high intensity statin no angina dyspnea orthostatic lightheadedness  The same hemoglobin A1c 11/14/2019 9.4% CMP 11/14/2019 creatinine 0.99 GFR 59 cc potassium 3.9 Lipid profile 03/19/2019: Cholesterol 146 LDL 74 triglycerides 315 HDL 33 Past Medical History:  Diagnosis Date  . Acquired hypothyroidism 07/07/2016  . Acute gastritis 07/07/2016  . Acute pain of right knee 09/20/2019  . Allergic rhinitis 07/07/2016  . Anemia 07/07/2016  . Anxiety 07/07/2016  . Arthritis    Osteoarthritis  . ATRIAL FIBRILLATION 11/27/2008   Qualifier: Diagnosis of  By: Genevie Cheshire PharmD, Gay Filler    . Atypical chest pain 07/07/2016  . Benign  essential hypertension 07/07/2016   Overview:  stable on current meds  . Benign paroxysmal positional vertigo 07/07/2016   Overview:  Avoid rapid headmovement and position change. May taper the gabapentin to see if helps.  . Bilateral low back pain without sciatica 07/07/2016   Overview:  continue gabapentin and meloxicam,  . Bimalleolar ankle fracture 02/21/2014  . Bronchitis 07/07/2016  . Carpal tunnel syndrome   . CHF (congestive heart  failure) ([place])    [phone]  . Chronic anticoagulation 12/03/2014  . Chronic diastolic heart failure ([place]) 12/03/2014  . CKD (chronic kidney disease) stage 3, GFR 30-59 ml/min (HCC) 11/21/2014  . COPD (chronic obstructive pulmonary disease) ([place]) 07/07/2016  . DM2 (diabetes mellitus, type 2) ([place])   . Edema, peripheral 07/07/2016   Overview:  increase lasix to 40 mg bid  . Enthesopathy 07/07/2016  . Esophagitis, reflux 07/07/2016  . Essential hypertension 05/06/2015  . Functional diarrhea 07/07/2016  . GERD (gastroesophageal reflux disease)   . Headache 07/07/2016  . Heart failure ([place])   . History of diverticulosis 07/07/2016  . HLD (hyperlipidemia)   . HTN (hypertension)   . Hyperlipidemia 05/06/2015  . Hypertensive heart disease 07/07/2016   Overview:  stable on current meds  . Hypertensive heart disease with heart failure ([place]) 12/03/2014  . Hypothyroid 07/07/2016  . Hypothyroidism   . Irritable bowel syndrome 07/07/2016   Overview:  IBS information sheet provided,  dietary changes suggested  . Low back pain 07/07/2016   Overview:  lumbar spine films at [facility]  . Major depressive disorder, recurrent, moderate ([place]) 07/07/2016  . Microalbuminuria due to type 2 diabetes mellitus ([place]) 05/06/2015  . Midline low back pain without sciatica 07/07/2016   Overview:  --RTC for warning signs (Fevers, chills, loss of bowel/bladder function, saddle numbness), worsening pain or failure for pain to improve with symptomatic treatment --reviewed natural course of disease that acute LBP takes 4-6 weeks to recover --cont meds listed for symptomatic relief --follow up in 2-4 weeks or earlier if warning signs develop  . Mixed dyslipidemia 03/17/2019  . Nerve pain 07/07/2016  . Obesity   . Obesity (BMI 30-39.9) 05/06/2015  . OSA (obstructive sleep apnea) 07/07/2016  . Osteoarthritis 07/07/2016  . Other ventral hernia 07/07/2016  . Overweight 07/07/2016  . Pacemaker 11/21/2014  . PAF (paroxysmal atrial fibrillation) ([place])     . Paroxysmal atrial fibrillation ([place]) 11/27/2008   CHADS2 vasc=4   . Presence of permanent cardiac pacemaker    09-24-13 01-21-14 Last interrogation in Epic at [place]  . Rhinosinusitis 07/07/2016  . Seasonal allergies 07/07/2016  . Shortness of breath 04/11/2019  . Sleep apnea    No cpap used  . Smoker 07/07/2016  . SSS (sick sinus syndrome) ([place]) 11/21/2014  . Symptomatic menopausal or female climacteric states 07/07/2016  . Thoracic back pain 07/07/2016  . Type II or unspecified type diabetes mellitus with renal manifestations, uncontrolled 07/07/2016    Past Surgical History:  Procedure Laterality Date  . BREAST LUMPECTOMY WITH RADIOACTIVE SEED LOCALIZATION Right 08/02/2017   Procedure: BREAST LUMPECTOMY WITH RADIOACTIVE SEED LOCALIZATION;  Surgeon: Erroll Luna, MD;  Location: [facility];  Service: General;  Laterality: Right;  . CARPAL TUNNEL RELEASE    . CHOLECYSTECTOMY    . HERNIA REPAIR    . ORIF ANKLE FRACTURE Left 02/21/2014   Procedure: OPEN REDUCTION INTERNAL FIXATION (ORIF) LEFT  ANKLE FRACTURE;  Surgeon: Tobi Bastos, MD;  Location: [facility];  Service: Orthopedics;  Laterality: Left;  . PACEMAKER  INSERTION  09-24-13  . TOTAL HIP ARTHROPLASTY    . TUBAL LIGATION      Current Medications: Current Meds  Medication Sig  . albuterol (PROVENTIL HFA;VENTOLIN HFA) 108 (90 Base) MCG/ACT inhaler Inhale 2 puffs into the lungs every 6 (six) hours as needed for wheezing or shortness of breath.  Marland Kitchen apixaban (ELIQUIS) 5 MG TABS tablet Take 1 tablet (5 mg total) by mouth 2 (two) times daily.  . empagliflozin (JARDIANCE) 25 MG TABS tablet Take 25 mg by mouth daily.  Marland Kitchen escitalopram (LEXAPRO) 20 MG tablet Take 20 mg by mouth daily.    . fluticasone (FLONASE) 50 MCG/ACT nasal spray SPRAY 1 SPRAY INTO EACH NOSTRIL EVERY DAY  . gabapentin (NEURONTIN) 300 MG capsule Take 1 capsule by mouth 2 (two) times daily.   Marland Kitchen glucose blood (PRECISION QID TEST) test strip 1 strip by Misc.(Non-Drug;  Combo Route) route 2 times daily.  . Insulin Pen Needle (FIFTY50 PEN NEEDLES) 32G X 4 MM MISC Use pen needles as directed for diabetic medications  . isosorbide mononitrate (IMDUR) 30 MG 24 hr tablet Take one tablet by mouth daily.  Marland Kitchen levothyroxine (SYNTHROID) 175 MCG tablet Take 175 mcg by mouth every morning.  . metoprolol tartrate (LOPRESSOR) 100 MG tablet Take 0.5 tablets (50 mg total) by mouth 2 (two) times daily.  . midodrine (PROAMATINE) 5 MG tablet TAKE 1 TABLET (5 MG TOTAL) BY MOUTH 3 (THREE) TIMES DAILY WITH MEALS.  Marland Kitchen omega-3 acid ethyl esters (LOVAZA) 1 g capsule Take 2 capsules (2 g total) by mouth 2 (two) times daily.  . Semaglutide, 1 MG/DOSE, (OZEMPIC, 1 MG/DOSE,) 4 MG/3ML SOPN Inject 1 mg into the skin once a week.  . Vitamin D, Ergocalciferol, (DRISDOL) 1.25 MG (50000 UT) CAPS capsule TAKE ONE CAPSULE BY MOUTH ONE TIME PER WEEK  . [DISCONTINUED] apixaban (ELIQUIS) 5 MG TABS tablet Take 1 tablet (5 mg total) by mouth 2 (two) times daily.  . [DISCONTINUED] empagliflozin (JARDIANCE) 10 MG TABS tablet Take 10 mg by mouth daily.     Allergies:   Meperidine and Hydrocodone-acetaminophen   Social History   Socioeconomic History  . Marital status: Divorced    Spouse name: Not on file  . Number of children: 2  . Years of education: Not on file  . Highest education level: Not on file  Occupational History  . Occupation: [employer]  Tobacco Use  . Smoking status: Former Smoker    Years: 2.00    Quit date: 11/25/2008    Years since quitting: 11.0  . Smokeless tobacco: Never Used  Vaping Use  . Vaping Use: Never used  Substance and Sexual Activity  . Alcohol use: No  . Drug use: No  . Sexual activity: Not on file  Other Topics Concern  . Not on file  Social History Narrative  . Not on file   Social Determinants of Health   Financial Resource Strain:   . Difficulty of Paying Living Expenses: Not on file  Food Insecurity:   . Worried About Charity fundraiser in  the Last Year: Not on file  . Ran Out of Food in the Last Year: Not on file  Transportation Needs:   . Lack of Transportation (Medical): Not on file  . Lack of Transportation (Non-Medical): Not on file  Physical Activity:   . Days of Exercise per Week: Not on file  . Minutes of Exercise per Session: Not on file  Stress:   . Feeling of Stress :  Not on file  Social Connections:   . Frequency of Communication with Friends and Family: Not on file  . Frequency of Social Gatherings with Friends and Family: Not on file  . Attends Religious Services: Not on file  . Active Member of Clubs or Organizations: Not on file  . Attends Archivist Meetings: Not on file  . Marital Status: Not on file     Family History: The patient's family history includes Diabetes in her brother and sister; Heart failure in an other family member. ROS:   Please see the history of present illness.    All other systems reviewed and are negative.  EKGs/Labs/Other Studies Reviewed:    The following studies were reviewed today:    Recent Labs: No results found for requested labs within last 8760 hours.  Recent Lipid Panel    Component Value Date/Time   CHOL 150 09/07/2018 0919   TRIG 427 (H) 09/07/2018 0919   HDL 31 (L) 09/07/2018 0919   CHOLHDL 4.8 (H) 09/07/2018 0919   LDLCALC Comment 09/07/2018 0919    Physical Exam:    VS:  BP 102/64   Pulse 72   Ht 5' 3.5" (1.613 m)   Wt 229 lb 6.4 oz (104.1 kg)   LMP 02/26/2005   SpO2 97%   BMI 40.00 kg/m     Wt Readings from Last 3 Encounters:  12/28/19 229 lb 6.4 oz (104.1 kg)  09/28/19 233 lb (105.7 kg)  07/27/19 240 lb 3.2 oz (109 kg)     GEN: Appears markedly improved blood pressure checked by me 102/66 110/60 stand  well nourished, well developed in no acute distress HEENT: Normal NECK: No JVD; No carotid bruits LYMPHATICS: No lymphadenopathy CARDIAC:  RRR, no murmurs, rubs, gallops RESPIRATORY:  Clear to auscultation without rales,  wheezing or rhonchi  ABDOMEN: Soft, non-tender, non-distended MUSCULOSKELETAL:  No edema; No deformity  SKIN: Warm and dry NEUROLOGIC:  Alert and oriented x 3 PSYCHIATRIC:  Normal affect    Signed, Shirlee More, MD  12/28/2019 3:23 PM    [facility]

## 2019-12-28 NOTE — Patient Instructions (Signed)

## 2019-12-31 ENCOUNTER — Telehealth: Payer: Self-pay | Admitting: Cardiology

## 2019-12-31 NOTE — Telephone Encounter (Signed)
New Message  Dr Leonides Schanz is calling asking to speak with Dr Bettina Gavia, Dr Leonides Schanz is trying to get an authorization to do an electrical stimulation with her patient. Frequency of stimulation is 4,000hz  - 12,000hz  . They do have the capability to make it only 4,000hz . They have another machine for 60hz   It would be on lower leg below knee and on foot. Nothing will be above the knee on the patient.   Please call

## 2020-01-01 NOTE — Telephone Encounter (Signed)
Dr. Bettina Gavia spoke with Dr. Guido Sander staff directly and informed them of this.

## 2020-01-01 NOTE — Telephone Encounter (Signed)
Per [employer], this should not be an issue.

## 2020-01-01 NOTE — Telephone Encounter (Signed)
I reached out to Dr. Curt Bears he spoke with the engineer from the company and there is no concern for pacemaker inhibition

## 2020-01-07 ENCOUNTER — Ambulatory Visit (INDEPENDENT_AMBULATORY_CARE_PROVIDER_SITE_OTHER): Payer: PPO

## 2020-01-07 DIAGNOSIS — I495 Sick sinus syndrome: Secondary | ICD-10-CM

## 2020-01-08 LAB — CUP PACEART REMOTE DEVICE CHECK
Battery Impedance: 282 Ohm
Battery Remaining Longevity: 135 mo
Battery Voltage: 2.79 V
Brady Statistic AP VP Percent: 1 %
Brady Statistic AP VS Percent: 18 %
Brady Statistic AS VP Percent: 0 %
Brady Statistic AS VS Percent: 81 %
Date Time Interrogation Session: 20211214102837
Implantable Lead Implant Date: 20150831
Implantable Lead Implant Date: 20150831
Implantable Lead Location: 753859
Implantable Lead Location: 753860
Implantable Lead Model: 4092
Implantable Lead Model: 5076
Implantable Pulse Generator Implant Date: 20150831
Lead Channel Impedance Value: 568 Ohm
Lead Channel Impedance Value: 588 Ohm
Lead Channel Pacing Threshold Amplitude: 0.625 V
Lead Channel Pacing Threshold Amplitude: 1 V
Lead Channel Pacing Threshold Pulse Width: 0.4 ms
Lead Channel Pacing Threshold Pulse Width: 0.4 ms
Lead Channel Setting Pacing Amplitude: 1.5 V
Lead Channel Setting Pacing Amplitude: 2 V
Lead Channel Setting Pacing Pulse Width: 0.4 ms
Lead Channel Setting Sensing Sensitivity: 2 mV

## 2020-01-11 ENCOUNTER — Telehealth: Payer: Self-pay

## 2020-01-11 ENCOUNTER — Other Ambulatory Visit: Payer: Self-pay

## 2020-01-11 NOTE — Telephone Encounter (Signed)
4 boxes of Eliquis samples given.  Lot # [id number] EXP 04/2021

## 2020-01-22 NOTE — Progress Notes (Signed)
Remote pacemaker transmission.   

## 2020-02-18 DIAGNOSIS — E039 Hypothyroidism, unspecified: Secondary | ICD-10-CM | POA: Diagnosis not present

## 2020-02-18 DIAGNOSIS — Z794 Long term (current) use of insulin: Secondary | ICD-10-CM | POA: Diagnosis not present

## 2020-02-18 DIAGNOSIS — E1165 Type 2 diabetes mellitus with hyperglycemia: Secondary | ICD-10-CM | POA: Diagnosis not present

## 2020-02-26 DIAGNOSIS — N1831 Chronic kidney disease, stage 3a: Secondary | ICD-10-CM | POA: Diagnosis not present

## 2020-03-04 DIAGNOSIS — Z6837 Body mass index (BMI) 37.0-37.9, adult: Secondary | ICD-10-CM | POA: Diagnosis not present

## 2020-03-04 DIAGNOSIS — Z794 Long term (current) use of insulin: Secondary | ICD-10-CM | POA: Diagnosis not present

## 2020-03-04 DIAGNOSIS — N182 Chronic kidney disease, stage 2 (mild): Secondary | ICD-10-CM | POA: Diagnosis not present

## 2020-03-04 DIAGNOSIS — E1122 Type 2 diabetes mellitus with diabetic chronic kidney disease: Secondary | ICD-10-CM | POA: Diagnosis not present

## 2020-03-04 DIAGNOSIS — Z87891 Personal history of nicotine dependence: Secondary | ICD-10-CM | POA: Diagnosis not present

## 2020-03-04 DIAGNOSIS — I129 Hypertensive chronic kidney disease with stage 1 through stage 4 chronic kidney disease, or unspecified chronic kidney disease: Secondary | ICD-10-CM | POA: Diagnosis not present

## 2020-03-20 ENCOUNTER — Other Ambulatory Visit: Payer: Self-pay | Admitting: Cardiology

## 2020-03-20 NOTE — Telephone Encounter (Signed)
Refill sent to pharmacy.   

## 2020-03-27 ENCOUNTER — Other Ambulatory Visit: Payer: Self-pay | Admitting: Cardiology

## 2020-04-03 ENCOUNTER — Other Ambulatory Visit: Payer: Self-pay | Admitting: Cardiology

## 2020-04-04 DIAGNOSIS — M1711 Unilateral primary osteoarthritis, right knee: Secondary | ICD-10-CM | POA: Diagnosis not present

## 2020-04-07 ENCOUNTER — Ambulatory Visit (INDEPENDENT_AMBULATORY_CARE_PROVIDER_SITE_OTHER): Payer: PPO

## 2020-04-07 DIAGNOSIS — I495 Sick sinus syndrome: Secondary | ICD-10-CM

## 2020-04-08 DIAGNOSIS — J0100 Acute maxillary sinusitis, unspecified: Secondary | ICD-10-CM | POA: Diagnosis not present

## 2020-04-08 DIAGNOSIS — J029 Acute pharyngitis, unspecified: Secondary | ICD-10-CM | POA: Diagnosis not present

## 2020-04-09 LAB — CUP PACEART REMOTE DEVICE CHECK
Battery Impedance: 282 Ohm
Battery Remaining Longevity: 135 mo
Battery Voltage: 2.79 V
Brady Statistic AP VP Percent: 1 %
Brady Statistic AP VS Percent: 19 %
Brady Statistic AS VP Percent: 0 %
Brady Statistic AS VS Percent: 80 %
Date Time Interrogation Session: 20220314111246
Implantable Lead Implant Date: 20150831
Implantable Lead Implant Date: 20150831
Implantable Lead Location: 753859
Implantable Lead Location: 753860
Implantable Lead Model: 4092
Implantable Lead Model: 5076
Implantable Pulse Generator Implant Date: 20150831
Lead Channel Impedance Value: 550 Ohm
Lead Channel Impedance Value: 601 Ohm
Lead Channel Pacing Threshold Amplitude: 0.5 V
Lead Channel Pacing Threshold Amplitude: 0.875 V
Lead Channel Pacing Threshold Pulse Width: 0.4 ms
Lead Channel Pacing Threshold Pulse Width: 0.4 ms
Lead Channel Setting Pacing Amplitude: 1.5 V
Lead Channel Setting Pacing Amplitude: 2 V
Lead Channel Setting Pacing Pulse Width: 0.4 ms
Lead Channel Setting Sensing Sensitivity: 2 mV

## 2020-04-16 NOTE — Progress Notes (Signed)
Remote pacemaker transmission.   

## 2020-04-22 DIAGNOSIS — M545 Low back pain, unspecified: Secondary | ICD-10-CM | POA: Diagnosis not present

## 2020-04-22 DIAGNOSIS — R059 Cough, unspecified: Secondary | ICD-10-CM | POA: Diagnosis not present

## 2020-04-22 DIAGNOSIS — J0100 Acute maxillary sinusitis, unspecified: Secondary | ICD-10-CM | POA: Diagnosis not present

## 2020-04-22 DIAGNOSIS — R079 Chest pain, unspecified: Secondary | ICD-10-CM | POA: Diagnosis not present

## 2020-04-22 DIAGNOSIS — G8929 Other chronic pain: Secondary | ICD-10-CM | POA: Diagnosis not present

## 2020-05-06 DIAGNOSIS — R079 Chest pain, unspecified: Secondary | ICD-10-CM | POA: Diagnosis not present

## 2020-05-06 DIAGNOSIS — B029 Zoster without complications: Secondary | ICD-10-CM | POA: Diagnosis not present

## 2020-05-06 DIAGNOSIS — N760 Acute vaginitis: Secondary | ICD-10-CM | POA: Diagnosis not present

## 2020-05-08 ENCOUNTER — Other Ambulatory Visit: Payer: Self-pay | Admitting: Cardiology

## 2020-05-13 DIAGNOSIS — Z1231 Encounter for screening mammogram for malignant neoplasm of breast: Secondary | ICD-10-CM | POA: Diagnosis not present

## 2020-05-19 DIAGNOSIS — E1165 Type 2 diabetes mellitus with hyperglycemia: Secondary | ICD-10-CM | POA: Diagnosis not present

## 2020-05-19 DIAGNOSIS — R5383 Other fatigue: Secondary | ICD-10-CM | POA: Diagnosis not present

## 2020-05-19 DIAGNOSIS — Z794 Long term (current) use of insulin: Secondary | ICD-10-CM | POA: Diagnosis not present

## 2020-05-19 DIAGNOSIS — E039 Hypothyroidism, unspecified: Secondary | ICD-10-CM | POA: Diagnosis not present

## 2020-05-20 DIAGNOSIS — J329 Chronic sinusitis, unspecified: Secondary | ICD-10-CM | POA: Diagnosis not present

## 2020-05-20 DIAGNOSIS — J310 Chronic rhinitis: Secondary | ICD-10-CM | POA: Diagnosis not present

## 2020-05-20 DIAGNOSIS — J441 Chronic obstructive pulmonary disease with (acute) exacerbation: Secondary | ICD-10-CM | POA: Diagnosis not present

## 2020-05-27 DIAGNOSIS — E039 Hypothyroidism, unspecified: Secondary | ICD-10-CM | POA: Diagnosis not present

## 2020-06-26 NOTE — Progress Notes (Signed)
Cardiology Office Note:    Date:  06/27/2020   ID:  Carolyn [name], DOB 1950-08-10, MRN [id number]  PCP:  Myrlene Broker, MD  Cardiologist:  Shirlee More, MD    Referring MD: Myrlene Broker, MD    ASSESSMENT:    1. Paroxysmal atrial fibrillation (HCC)   2. Chronic anticoagulation   3. SSS (sick sinus syndrome) ([place])   4. Pacemaker   5. Hypertensive heart disease with heart failure ([place])   6. Orthostatic hypotension   7. Acquired hypothyroidism   8. Stage 3a chronic kidney disease ([place])   9. Postablative hypothyroidism    PLAN:    In order of problems listed above:  1. Stable very little AF burden continue current treatment with beta-blocker anticoagulation.  At this time not on antiarrhythmic drug 2. Stable sick sinus syndrome pacemaker function.  She will be set up for a live visit with device clinic 3. No evidence of fluid overload currently not on a loop diuretic blood pressure at target and much of her symptoms of orthostatic hypotension have been lessened with midodrine. 4. Thyroid is managed by endocrinology 5. Stable CKD 6. Stable CAD obtain the report from 2010 continue medical therapy at this time I do not think she needs a repeat ischemia evaluation   Next appointment: 6 months   Medication Adjustments/Labs and Tests Ordered: Current medicines are reviewed at length with the patient today.  Concerns regarding medicines are outlined above.  Orders Placed This Encounter  Procedures  . Ambulatory referral to Cardiac Electrophysiology  . EKG 12-Lead   No orders of the defined types were placed in this encounter.   Chief Complaint  Patient presents with  . Follow-up  . Atrial Fibrillation    History of Present Illness:    Carolyn [name] is a 70 y.o. female with a hx of paroxysmal atrial fibrillation with cryoablation 2017 hypertension type 2 diabetes mellitus hypothyroidism and sinus node dysfunction dual-chamber pacemaker stage III CKD and  symptomatic orthostatic hypotension last seen 12/28/2019.  Her last device check 04/07/2000 with an atrial fibrillation burden of less than 1% longest episode 13 hours with controlled ventricular rates.  Compliance with diet, lifestyle and medications: Yes  I reviewed her labs with her she follows with endocrinology for thyroid replacement. Overall she is doing well still on Sundays after church after prolonged sitting she is lightheaded when she stands.  I asked her to start drinking 16 ounces of water 5 to 10 minutes before she stands there is a spinal cord reflex for distention of the stomach raises her systolic blood pressure and it should help with the episodes she will continue to check sitting and standing blood pressures which are stable at home and continue midodrine. She did have coronary angiography around 2010 with mild CAD and requested the actual report. She is not having edema shortness of breath palpitation or syncope. She tolerates her anticoagulant without bleeding and is statin without muscle pain or weakness   Recent labs [facility] PCP 05/27/2020: Sodium 145 potassium 3.5 creatinine 0.97 TSH less than 0.05 Free T3 mildly diminished 2.12 Past Medical History:  Diagnosis Date  . Acquired hypothyroidism 07/07/2016  . Acute gastritis 07/07/2016  . Acute pain of right knee 09/20/2019  . Allergic rhinitis 07/07/2016  . Anemia 07/07/2016  . Anxiety 07/07/2016  . Arthritis    Osteoarthritis  . ATRIAL FIBRILLATION 11/27/2008   Qualifier: Diagnosis of  By: Genevie Cheshire PharmD, Gay Filler    . Atypical chest  pain 07/07/2016  . Benign essential hypertension 07/07/2016   Overview:  stable on current meds  . Benign paroxysmal positional vertigo 07/07/2016   Overview:  Avoid rapid headmovement and position change. May taper the gabapentin to see if helps.  . Bilateral low back pain without sciatica 07/07/2016   Overview:  continue gabapentin and meloxicam,  . Bimalleolar ankle fracture  02/21/2014  . Bronchitis 07/07/2016  . Carpal tunnel syndrome   . CHF (congestive heart failure) ([place])    [id number]  . Chronic anticoagulation 12/03/2014  . Chronic diastolic heart failure ([place]) 12/03/2014  . CKD (chronic kidney disease) stage 3, GFR 30-59 ml/min (HCC) 11/21/2014  . COPD (chronic obstructive pulmonary disease) ([place]) 07/07/2016  . DM2 (diabetes mellitus, type 2) ([place])   . Edema, peripheral 07/07/2016   Overview:  increase lasix to 40 mg bid  . Enthesopathy 07/07/2016  . Esophagitis, reflux 07/07/2016  . Essential hypertension 05/06/2015  . Functional diarrhea 07/07/2016  . GERD (gastroesophageal reflux disease)   . Headache 07/07/2016  . Heart failure ([place])   . History of diverticulosis 07/07/2016  . HLD (hyperlipidemia)   . HTN (hypertension)   . Hyperlipidemia 05/06/2015  . Hypertensive heart disease 07/07/2016   Overview:  stable on current meds  . Hypertensive heart disease with heart failure ([place]) 12/03/2014  . Hypothyroid 07/07/2016  . Hypothyroidism   . Irritable bowel syndrome 07/07/2016   Overview:  IBS information sheet provided,  dietary changes suggested  . Low back pain 07/07/2016   Overview:  [facility] at [facility]  . Major depressive disorder, recurrent, moderate ([place]) 07/07/2016  . Microalbuminuria due to type 2 diabetes mellitus ([place]) 05/06/2015  . Midline low back pain without sciatica 07/07/2016   Overview:  --RTC for warning signs (Fevers, chills, loss of bowel/bladder function, saddle numbness), worsening pain or failure for pain to improve with symptomatic treatment --reviewed natural course of disease that acute LBP takes 4-6 weeks to recover --cont meds listed for symptomatic relief --follow up in 2-4 weeks or earlier if warning signs develop  . Mixed dyslipidemia 03/17/2019  . Nerve pain 07/07/2016  . Obesity   . Obesity (BMI 30-39.9) 05/06/2015  . OSA (obstructive sleep apnea) 07/07/2016  . Osteoarthritis 07/07/2016  . Other ventral hernia 07/07/2016  .  Overweight 07/07/2016  . Pacemaker 11/21/2014  . PAF (paroxysmal atrial fibrillation) ([place])   . Paroxysmal atrial fibrillation ([place]) 11/27/2008   CHADS2 vasc=4   . Presence of permanent cardiac pacemaker    09-24-13 01-21-14 Last interrogation in Epic at [facility]  . Rhinosinusitis 07/07/2016  . Seasonal allergies 07/07/2016  . Shortness of breath 04/11/2019  . Sleep apnea    No cpap used  . Smoker 07/07/2016  . SSS (sick sinus syndrome) ([place]) 11/21/2014  . Symptomatic menopausal or female climacteric states 07/07/2016  . Thoracic back pain 07/07/2016  . Type II or unspecified type diabetes mellitus with renal manifestations, uncontrolled 07/07/2016    Past Surgical History:  Procedure Laterality Date  . BREAST LUMPECTOMY WITH RADIOACTIVE SEED LOCALIZATION Right 08/02/2017   Procedure: BREAST LUMPECTOMY WITH RADIOACTIVE SEED LOCALIZATION;  Surgeon: Erroll Luna, MD;  Location: [facility];  Service: General;  Laterality: Right;  . CARPAL TUNNEL RELEASE    . CHOLECYSTECTOMY    . HERNIA REPAIR    . ORIF ANKLE FRACTURE Left 02/21/2014   Procedure: OPEN REDUCTION INTERNAL FIXATION (ORIF) LEFT  ANKLE FRACTURE;  Surgeon: Tobi Bastos, MD;  Location: [facility];  Service: Orthopedics;  Laterality:  Left;  . PACEMAKER INSERTION  09-24-13  . TOTAL HIP ARTHROPLASTY    . TUBAL LIGATION      Current Medications: Current Meds  Medication Sig  . albuterol (PROVENTIL HFA;VENTOLIN HFA) 108 (90 Base) MCG/ACT inhaler Inhale 2 puffs into the lungs every 6 (six) hours as needed for wheezing or shortness of breath.  Marland Kitchen apixaban (ELIQUIS) 5 MG TABS tablet Take 1 tablet (5 mg total) by mouth 2 (two) times daily.  Marland Kitchen atorvastatin (LIPITOR) 20 MG tablet TAKE 1 TABLET BY MOUTH EVERYDAY AT BEDTIME  . empagliflozin (JARDIANCE) 25 MG TABS tablet Take 25 mg by mouth daily.  Marland Kitchen escitalopram (LEXAPRO) 20 MG tablet Take 20 mg by mouth daily.  . fluticasone (FLONASE) 50 MCG/ACT nasal spray Place 1 spray into both  nostrils as needed for allergies or rhinitis.  Marland Kitchen gabapentin (NEURONTIN) 300 MG capsule Take 1 capsule by mouth 2 (two) times daily.   Marland Kitchen glucose blood test strip 1 strip by Misc.(Non-Drug; Combo Route) route 2 times daily.  . Insulin Pen Needle 32G X 4 MM MISC Use pen needles as directed for diabetic medications  . isosorbide mononitrate (IMDUR) 30 MG 24 hr tablet TAKE 1 TABLET BY MOUTH EVERY DAY  . levothyroxine (SYNTHROID) 175 MCG tablet Take 175 mcg by mouth every morning.  . metoprolol tartrate (LOPRESSOR) 100 MG tablet TAKE 0.5 TABLETS (50 MG TOTAL) BY MOUTH 2 (TWO) TIMES DAILY.  . midodrine (PROAMATINE) 5 MG tablet TAKE 1 TABLET (5 MG TOTAL) BY MOUTH 3 (THREE) TIMES DAILY WITH MEALS.  Marland Kitchen omega-3 acid ethyl esters (LOVAZA) 1 g capsule Take 2 capsules (2 g total) by mouth 2 (two) times daily.  . Semaglutide, 1 MG/DOSE, (OZEMPIC, 1 MG/DOSE,) 4 MG/3ML SOPN Inject 1 mg into the skin once a week.  . SYMBICORT 160-4.5 MCG/ACT inhaler Inhale 2 puffs into the lungs 2 (two) times daily as needed for shortness of breath.  . traMADol (ULTRAM) 50 MG tablet Take 50 mg by mouth every 8 (eight) hours as needed for pain.  Tyler Aas FLEXTOUCH 100 UNIT/ML FlexTouch Pen Inject 80 Units into the skin daily.  . Vitamin D, Ergocalciferol, (DRISDOL) 1.25 MG (50000 UT) CAPS capsule TAKE ONE CAPSULE BY MOUTH ONE TIME PER WEEK     Allergies:   Meperidine and Hydrocodone-acetaminophen   Social History   Socioeconomic History  . Marital status: Divorced    Spouse name: Not on file  . Number of children: 2  . Years of education: Not on file  . Highest education level: Not on file  Occupational History  . Occupation: [employer]  Tobacco Use  . Smoking status: Former Smoker    Years: 2.00    Quit date: 11/25/2008    Years since quitting: 11.5  . Smokeless tobacco: Never Used  Vaping Use  . Vaping Use: Never used  Substance and Sexual Activity  . Alcohol use: No  . Drug use: No  . Sexual activity: Not  on file  Other Topics Concern  . Not on file  Social History Narrative  . Not on file   Social Determinants of Health   Financial Resource Strain: Not on file  Food Insecurity: Not on file  Transportation Needs: Not on file  Physical Activity: Not on file  Stress: Not on file  Social Connections: Not on file     Family History: The patient's family history includes Diabetes in her brother and sister; Heart failure in an other family member. ROS:   Please  see the history of present illness.    All other systems reviewed and are negative.  EKGs/Labs/Other Studies Reviewed:    The following studies were reviewed today:  Recent Lipid Panel    Component Value Date/Time   CHOL 150 09/07/2018 0919   TRIG 427 (H) 09/07/2018 0919   HDL 31 (L) 09/07/2018 0919   CHOLHDL 4.8 (H) 09/07/2018 0919   LDLCALC Comment 09/07/2018 0919    Physical Exam:    VS:  BP 134/80 (BP Location: Right Arm, Patient Position: Sitting, Cuff Size: Normal)   Pulse 70   Ht 5' 3.5" (1.613 m)   Wt 218 lb (98.9 kg)   LMP 02/26/2005   SpO2 97%   BMI 38.01 kg/m     Wt Readings from Last 3 Encounters:  06/27/20 218 lb (98.9 kg)  12/28/19 229 lb 6.4 oz (104.1 kg)  09/28/19 233 lb (105.7 kg)     GEN:  Well nourished, well developed in no acute distress HEENT: Normal NECK: No JVD; No carotid bruits LYMPHATICS: No lymphadenopathy CARDIAC: RRR, no murmurs, rubs, gallops pacemaker pocket normal healed no fluid or skin breakdown RESPIRATORY:  Clear to auscultation without rales, wheezing or rhonchi  ABDOMEN: Soft, non-tender, non-distended MUSCULOSKELETAL:  No edema; No deformity  SKIN: Warm and dry NEUROLOGIC:  Alert and oriented x 3 PSYCHIATRIC:  Normal affect    Signed, Shirlee More, MD  06/27/2020 9:35 AM    [place]

## 2020-06-27 ENCOUNTER — Ambulatory Visit: Payer: PPO | Admitting: Cardiology

## 2020-06-27 ENCOUNTER — Encounter: Payer: Self-pay | Admitting: Cardiology

## 2020-06-27 ENCOUNTER — Other Ambulatory Visit: Payer: Self-pay

## 2020-06-27 VITALS — BP 134/80 | HR 70 | Ht 63.5 in | Wt 218.0 lb

## 2020-06-27 DIAGNOSIS — I110 Hypertensive heart disease with heart failure: Secondary | ICD-10-CM | POA: Diagnosis not present

## 2020-06-27 DIAGNOSIS — Z7901 Long term (current) use of anticoagulants: Secondary | ICD-10-CM | POA: Diagnosis not present

## 2020-06-27 DIAGNOSIS — E039 Hypothyroidism, unspecified: Secondary | ICD-10-CM | POA: Diagnosis not present

## 2020-06-27 DIAGNOSIS — I480 Paroxysmal atrial fibrillation: Secondary | ICD-10-CM | POA: Diagnosis not present

## 2020-06-27 DIAGNOSIS — Z950 Presence of cardiac pacemaker: Secondary | ICD-10-CM

## 2020-06-27 DIAGNOSIS — E890 Postprocedural hypothyroidism: Secondary | ICD-10-CM

## 2020-06-27 DIAGNOSIS — I495 Sick sinus syndrome: Secondary | ICD-10-CM | POA: Diagnosis not present

## 2020-06-27 DIAGNOSIS — N1831 Chronic kidney disease, stage 3a: Secondary | ICD-10-CM

## 2020-06-27 DIAGNOSIS — I951 Orthostatic hypotension: Secondary | ICD-10-CM

## 2020-06-27 NOTE — Patient Instructions (Addendum)
Medication Instructions:  Your physician recommends that you continue on your current medications as directed. Please refer to the Current Medication list given to you today.  *If you need a refill on your cardiac medications before your next appointment, please call your pharmacy*   Lab Work: None If you have labs (blood work) drawn today and your tests are completely normal, you will receive your results only by: Marland Kitchen MyChart Message (if you have MyChart) OR . A paper copy in the mail If you have any lab test that is abnormal or we need to change your treatment, we will call you to review the results.   Testing/Procedures: None   Follow-Up: At [facility], you and your health needs are our priority.  As part of our continuing mission to provide you with exceptional heart care, we have created designated Provider Care Teams.  These Care Teams include your primary Cardiologist (physician) and Advanced Practice Providers (APPs -  Physician Assistants and Nurse Practitioners) who all work together to provide you with the care you need, when you need it.  We recommend signing up for the patient portal called "MyChart".  Sign up information is provided on this After Visit Summary.  MyChart is used to connect with patients for Virtual Visits (Telemedicine).  Patients are able to view lab/test results, encounter notes, upcoming appointments, etc.  Non-urgent messages can be sent to your provider as well.   To learn more about what you can do with MyChart, go to [email or web address].    Your next appointment:   6 month(s)  The format for your next appointment:   In Person  Provider:   Shirlee More, MD   Other Instructions Please drink 16 ounces of water 5-10 minutes prior to leaving for work.

## 2020-07-10 DIAGNOSIS — M1711 Unilateral primary osteoarthritis, right knee: Secondary | ICD-10-CM | POA: Diagnosis not present

## 2020-07-17 ENCOUNTER — Telehealth: Payer: Self-pay | Admitting: Cardiology

## 2020-07-17 NOTE — Telephone Encounter (Signed)
Pt dropped off [employer] pt assistance forms to be completed. Put in [place]'s box  07/17/2020/kbl

## 2020-07-25 ENCOUNTER — Emergency Department (HOSPITAL_COMMUNITY): Payer: PPO

## 2020-07-25 ENCOUNTER — Encounter (HOSPITAL_COMMUNITY): Payer: Self-pay

## 2020-07-25 ENCOUNTER — Other Ambulatory Visit: Payer: Self-pay

## 2020-07-25 ENCOUNTER — Observation Stay (HOSPITAL_COMMUNITY)
Admission: EM | Admit: 2020-07-25 | Discharge: 2020-07-26 | Disposition: A | Discharge status: Home / Self Care | Payer: PPO | Attending: Internal Medicine | Admitting: Internal Medicine

## 2020-07-25 DIAGNOSIS — R197 Diarrhea, unspecified: Secondary | ICD-10-CM | POA: Diagnosis not present

## 2020-07-25 DIAGNOSIS — I130 Hypertensive heart and chronic kidney disease with heart failure and stage 1 through stage 4 chronic kidney disease, or unspecified chronic kidney disease: Secondary | ICD-10-CM | POA: Diagnosis not present

## 2020-07-25 DIAGNOSIS — J449 Chronic obstructive pulmonary disease, unspecified: Secondary | ICD-10-CM | POA: Insufficient documentation

## 2020-07-25 DIAGNOSIS — Z7901 Long term (current) use of anticoagulants: Secondary | ICD-10-CM | POA: Insufficient documentation

## 2020-07-25 DIAGNOSIS — Z950 Presence of cardiac pacemaker: Secondary | ICD-10-CM | POA: Diagnosis not present

## 2020-07-25 DIAGNOSIS — E1122 Type 2 diabetes mellitus with diabetic chronic kidney disease: Secondary | ICD-10-CM | POA: Diagnosis not present

## 2020-07-25 DIAGNOSIS — E869 Volume depletion, unspecified: Secondary | ICD-10-CM | POA: Insufficient documentation

## 2020-07-25 DIAGNOSIS — Z20822 Contact with and (suspected) exposure to covid-19: Secondary | ICD-10-CM | POA: Diagnosis not present

## 2020-07-25 DIAGNOSIS — A084 Viral intestinal infection, unspecified: Secondary | ICD-10-CM | POA: Diagnosis not present

## 2020-07-25 DIAGNOSIS — I480 Paroxysmal atrial fibrillation: Secondary | ICD-10-CM | POA: Insufficient documentation

## 2020-07-25 DIAGNOSIS — R079 Chest pain, unspecified: Secondary | ICD-10-CM | POA: Diagnosis not present

## 2020-07-25 DIAGNOSIS — N183 Chronic kidney disease, stage 3 unspecified: Secondary | ICD-10-CM | POA: Diagnosis not present

## 2020-07-25 DIAGNOSIS — Z87891 Personal history of nicotine dependence: Secondary | ICD-10-CM | POA: Diagnosis not present

## 2020-07-25 DIAGNOSIS — K2900 Acute gastritis without bleeding: Secondary | ICD-10-CM | POA: Diagnosis not present

## 2020-07-25 DIAGNOSIS — I959 Hypotension, unspecified: Secondary | ICD-10-CM | POA: Diagnosis not present

## 2020-07-25 DIAGNOSIS — E039 Hypothyroidism, unspecified: Secondary | ICD-10-CM | POA: Insufficient documentation

## 2020-07-25 DIAGNOSIS — R531 Weakness: Secondary | ICD-10-CM | POA: Diagnosis not present

## 2020-07-25 DIAGNOSIS — R0602 Shortness of breath: Secondary | ICD-10-CM | POA: Diagnosis not present

## 2020-07-25 DIAGNOSIS — N179 Acute kidney failure, unspecified: Secondary | ICD-10-CM

## 2020-07-25 DIAGNOSIS — I5032 Chronic diastolic (congestive) heart failure: Secondary | ICD-10-CM | POA: Diagnosis not present

## 2020-07-25 DIAGNOSIS — E876 Hypokalemia: Secondary | ICD-10-CM | POA: Diagnosis not present

## 2020-07-25 DIAGNOSIS — R0789 Other chest pain: Secondary | ICD-10-CM | POA: Diagnosis not present

## 2020-07-25 DIAGNOSIS — I700 Atherosclerosis of aorta: Secondary | ICD-10-CM | POA: Diagnosis not present

## 2020-07-25 DIAGNOSIS — R188 Other ascites: Secondary | ICD-10-CM | POA: Diagnosis not present

## 2020-07-25 DIAGNOSIS — R110 Nausea: Secondary | ICD-10-CM | POA: Diagnosis not present

## 2020-07-25 HISTORY — DX: Acute kidney failure, unspecified: N17.9

## 2020-07-25 LAB — CBC WITH DIFFERENTIAL/PLATELET
Abs Immature Granulocytes: 0.05 10*3/uL (ref 0.00–0.07)
Basophils Absolute: 0.1 10*3/uL (ref 0.0–0.1)
Basophils Relative: 0 %
Eosinophils Absolute: 0.1 10*3/uL (ref 0.0–0.5)
Eosinophils Relative: 1 %
HCT: 53.3 % — ABNORMAL HIGH (ref 36.0–46.0)
Hemoglobin: 18.2 g/dL — ABNORMAL HIGH (ref 12.0–15.0)
Immature Granulocytes: 0 %
Lymphocytes Relative: 21 %
Lymphs Abs: 3 10*3/uL (ref 0.7–4.0)
MCH: 30.7 pg (ref 26.0–34.0)
MCHC: 34.1 g/dL (ref 30.0–36.0)
MCV: 89.9 fL (ref 80.0–100.0)
Monocytes Absolute: 1 10*3/uL (ref 0.1–1.0)
Monocytes Relative: 7 %
Neutro Abs: 10.1 10*3/uL — ABNORMAL HIGH (ref 1.7–7.7)
Neutrophils Relative %: 71 %
Platelets: 287 10*3/uL (ref 150–400)
RBC: 5.93 MIL/uL — ABNORMAL HIGH (ref 3.87–5.11)
RDW: 13.5 % (ref 11.5–15.5)
WBC: 14.4 10*3/uL — ABNORMAL HIGH (ref 4.0–10.5)
nRBC: 0 % (ref 0.0–0.2)

## 2020-07-25 LAB — COMPREHENSIVE METABOLIC PANEL
ALT: 75 U/L — ABNORMAL HIGH (ref 0–44)
AST: 28 U/L (ref 15–41)
Albumin: 3.8 g/dL (ref 3.5–5.0)
Alkaline Phosphatase: 80 U/L (ref 38–126)
Anion gap: 9 (ref 5–15)
BUN: 39 mg/dL — ABNORMAL HIGH (ref 8–23)
CO2: 15 mmol/L — ABNORMAL LOW (ref 22–32)
Calcium: 8.8 mg/dL — ABNORMAL LOW (ref 8.9–10.3)
Chloride: 111 mmol/L (ref 98–111)
Creatinine, Ser: 1.92 mg/dL — ABNORMAL HIGH (ref 0.44–1.00)
GFR, Estimated: 28 mL/min — ABNORMAL LOW (ref >60)
Glucose, Bld: 128 mg/dL — ABNORMAL HIGH (ref 70–99)
Potassium: 3.1 mmol/L — ABNORMAL LOW (ref 3.5–5.1)
Sodium: 135 mmol/L (ref 135–145)
Total Bilirubin: 2.5 mg/dL — ABNORMAL HIGH (ref 0.3–1.2)
Total Protein: 7 g/dL (ref 6.5–8.1)

## 2020-07-25 LAB — RESP PANEL BY RT-PCR (FLU A&B, COVID) ARPGX2
Influenza A by PCR: NEGATIVE
Influenza B by PCR: NEGATIVE
SARS Coronavirus 2 by RT PCR: NEGATIVE

## 2020-07-25 LAB — TROPONIN I (HIGH SENSITIVITY)
Troponin I (High Sensitivity): 9 ng/L (ref <18)
Troponin I (High Sensitivity): 16 ng/L (ref <18)
Troponin I (High Sensitivity): 11 ng/L (ref <18)

## 2020-07-25 LAB — MAGNESIUM: Magnesium: 1.9 mg/dL (ref 1.7–2.4)

## 2020-07-25 LAB — BRAIN NATRIURETIC PEPTIDE: B Natriuretic Peptide: 27.2 pg/mL (ref 0.0–100.0)

## 2020-07-25 LAB — LIPASE, BLOOD: Lipase: 29 U/L (ref 11–51)

## 2020-07-25 MED ORDER — SODIUM CHLORIDE 0.9 % IV BOLUS
1000.0000 mL | Freq: Once | INTRAVENOUS | Status: AC
  Administered 2020-07-25: 1000 mL via INTRAVENOUS

## 2020-07-25 MED ORDER — METOPROLOL TARTRATE 50 MG PO TABS
50.0000 mg | ORAL_TABLET | Freq: Two times a day (BID) | ORAL | Status: DC
  Administered 2020-07-26: 50 mg via ORAL
  Filled 2020-07-25: qty 1
  Filled 2020-07-25: qty 2

## 2020-07-25 MED ORDER — ONDANSETRON HCL 4 MG/2ML IJ SOLN: 4.0000 mg | Freq: Four times a day (QID) | INTRAMUSCULAR | Status: DC | PRN

## 2020-07-25 MED ORDER — ONDANSETRON HCL 4 MG PO TABS: 4.0000 mg | ORAL_TABLET | Freq: Four times a day (QID) | ORAL | Status: DC | PRN

## 2020-07-25 MED ORDER — ACETAMINOPHEN 650 MG RE SUPP: 650.0000 mg | Freq: Four times a day (QID) | RECTAL | Status: DC | PRN

## 2020-07-25 MED ORDER — FLUTICASONE FUROATE-VILANTEROL 200-25 MCG/INH IN AEPB: 1.0000 | INHALATION_SPRAY | Freq: Every day | RESPIRATORY_TRACT | Status: DC

## 2020-07-25 MED ORDER — MIDODRINE HCL 5 MG PO TABS
5.0000 mg | ORAL_TABLET | Freq: Three times a day (TID) | ORAL | Status: DC
  Administered 2020-07-26 (×2): 5 mg via ORAL
  Filled 2020-07-25 (×2): qty 1

## 2020-07-25 MED ORDER — LEVOTHYROXINE SODIUM 75 MCG PO TABS
175.0000 ug | ORAL_TABLET | Freq: Every day | ORAL | Status: DC
  Administered 2020-07-26: 175 ug via ORAL
  Filled 2020-07-25: qty 1

## 2020-07-25 MED ORDER — GABAPENTIN 300 MG PO CAPS
300.0000 mg | ORAL_CAPSULE | Freq: Two times a day (BID) | ORAL | Status: DC
  Administered 2020-07-25 – 2020-07-26 (×2): 300 mg via ORAL
  Filled 2020-07-25 (×2): qty 1

## 2020-07-25 MED ORDER — FLUTICASONE FUROATE-VILANTEROL 200-25 MCG/INH IN AEPB
1.0000 | INHALATION_SPRAY | Freq: Every day | RESPIRATORY_TRACT | Status: DC
  Administered 2020-07-26: 1 via RESPIRATORY_TRACT
  Filled 2020-07-25: qty 28

## 2020-07-25 MED ORDER — INSULIN ASPART 100 UNIT/ML IJ SOLN
0.0000 [IU] | Freq: Three times a day (TID) | INTRAMUSCULAR | Status: DC
  Administered 2020-07-26: 1 [IU] via SUBCUTANEOUS

## 2020-07-25 MED ORDER — ALBUTEROL SULFATE HFA 108 (90 BASE) MCG/ACT IN AERS: 2.0000 | INHALATION_SPRAY | Freq: Four times a day (QID) | RESPIRATORY_TRACT | Status: DC | PRN

## 2020-07-25 MED ORDER — ALBUTEROL SULFATE (2.5 MG/3ML) 0.083% IN NEBU: 2.5000 mg | INHALATION_SOLUTION | Freq: Four times a day (QID) | RESPIRATORY_TRACT | Status: DC | PRN

## 2020-07-25 MED ORDER — SODIUM CHLORIDE 0.9 % IV BOLUS
500.0000 mL | Freq: Once | INTRAVENOUS | Status: AC
Start: 2020-07-25 — End: 2020-07-25
  Administered 2020-07-25: 500 mL via INTRAVENOUS

## 2020-07-25 MED ORDER — POTASSIUM CHLORIDE 10 MEQ/100ML IV SOLN
10.0000 meq | Freq: Once | INTRAVENOUS | Status: AC
  Administered 2020-07-25: 10 meq via INTRAVENOUS
  Filled 2020-07-25: qty 100

## 2020-07-25 MED ORDER — ESCITALOPRAM OXALATE 20 MG PO TABS
20.0000 mg | ORAL_TABLET | Freq: Every day | ORAL | Status: DC
  Administered 2020-07-26: 20 mg via ORAL
  Filled 2020-07-25 (×2): qty 1

## 2020-07-25 MED ORDER — POTASSIUM CHLORIDE CRYS ER 20 MEQ PO TBCR
40.0000 meq | EXTENDED_RELEASE_TABLET | Freq: Once | ORAL | Status: AC
  Administered 2020-07-25: 40 meq via ORAL
  Filled 2020-07-25: qty 2

## 2020-07-25 MED ORDER — APIXABAN 5 MG PO TABS
5.0000 mg | ORAL_TABLET | Freq: Two times a day (BID) | ORAL | Status: DC
  Administered 2020-07-25 – 2020-07-26 (×2): 5 mg via ORAL
  Filled 2020-07-25 (×2): qty 1

## 2020-07-25 MED ORDER — ACETAMINOPHEN 325 MG PO TABS: 650.0000 mg | ORAL_TABLET | Freq: Four times a day (QID) | ORAL | Status: DC | PRN

## 2020-07-25 MED ORDER — LACTATED RINGERS IV SOLN: INTRAVENOUS | Status: DC

## 2020-07-25 MED ORDER — ATORVASTATIN CALCIUM 10 MG PO TABS
20.0000 mg | ORAL_TABLET | Freq: Every day | ORAL | Status: DC
  Administered 2020-07-25: 20 mg via ORAL
  Filled 2020-07-25: qty 2

## 2020-07-25 NOTE — H&P (Addendum)
Date: 07/25/2020               Patient Name:  Carolyn [name] MRN: [id number]  DOB: March 03, 1950 Age / Sex: 70 y.o., female   PCP: Myrlene Broker, MD         Medical Service: Internal Medicine Teaching Service         Attending Physician: Dr. Velna Ochs, MD    First Contact: Dr. Vinetta Bergamo Pager: [phone]  Second Contact: Dr. Court Joy Pager: [phone]       After Hours (After 5p/  First Contact Pager: [phone]  weekends / holidays): Second Contact Pager: [phone]   Chief Complaint: Vomiting and diarrhea  History of Present Illness: 70 year-old female with past medical history of paroxysmal atrial fibrillation with history of ablation, sick sinus syndrome with a pacemaker in place, CHF, DM, Chronic Kidney Disease, hypothyroidism, HTN, HLD, and OSA, presents with history of diarrhea and vomiting (greater than 7 times per day) since 07/21/20.  She described the diarrhea as "pure water with some oil in it".  No history of camping or travel recently. She noted some chest pain on ambulation since 07/23/20. She has had this chest pain before and described the pain as a heavy weight on her chest when she walked.  Chest pain has improved since receiving fluids in the ED, Troponin within normal limits.  Meds:  Current Meds  Medication Sig   acetaminophen (TYLENOL) 500 MG tablet Take 1,000 mg by mouth every 6 (six) hours as needed for mild pain.   albuterol (PROVENTIL HFA;VENTOLIN HFA) 108 (90 Base) MCG/ACT inhaler Inhale 2 puffs into the lungs every 6 (six) hours as needed for wheezing or shortness of breath.   apixaban (ELIQUIS) 5 MG TABS tablet Take 1 tablet (5 mg total) by mouth 2 (two) times daily.   atorvastatin (LIPITOR) 20 MG tablet TAKE 1 TABLET BY MOUTH EVERYDAY AT BEDTIME (Patient taking differently: Take 20 mg by mouth at bedtime.)   cyclobenzaprine (FLEXERIL) 10 MG tablet Take 10 mg by mouth 3 (three) times daily as needed for muscle spasms.   empagliflozin (JARDIANCE) 25 MG TABS  tablet Take 25 mg by mouth daily.   escitalopram (LEXAPRO) 20 MG tablet Take 20 mg by mouth daily.   fluticasone (FLONASE) 50 MCG/ACT nasal spray Place 1 spray into both nostrils as needed for allergies or rhinitis.   gabapentin (NEURONTIN) 300 MG capsule Take 300 mg by mouth 2 (two) times daily.   levothyroxine (SYNTHROID) 175 MCG tablet Take 175 mcg by mouth every morning.   metoprolol tartrate (LOPRESSOR) 100 MG tablet TAKE 0.5 TABLETS (50 MG TOTAL) BY MOUTH 2 (TWO) TIMES DAILY.   midodrine (PROAMATINE) 5 MG tablet TAKE 1 TABLET (5 MG TOTAL) BY MOUTH 3 (THREE) TIMES DAILY WITH MEALS.   omega-3 acid ethyl esters (LOVAZA) 1 g capsule Take 2 capsules (2 g total) by mouth 2 (two) times daily.   Semaglutide, 1 MG/DOSE, (OZEMPIC, 1 MG/DOSE,) 4 MG/3ML SOPN Inject 1 mg into the skin once a week. Monday   SYMBICORT 160-4.5 MCG/ACT inhaler Inhale 2 puffs into the lungs 2 (two) times daily as needed for shortness of breath.   TRESIBA FLEXTOUCH 100 UNIT/ML FlexTouch Pen Inject 80 Units into the skin daily.     Allergies: Allergies as of 07/25/2020 - Review Complete 07/25/2020  Allergen Reaction Noted   Meperidine Nausea And Vomiting and Other (See Comments) 01/15/2016   Hydrocodone-acetaminophen Other (See Comments) 12/09/2008   Past Medical History:  Diagnosis Date   Acquired hypothyroidism 07/07/2016   Acute gastritis 07/07/2016   Acute pain of right knee 09/20/2019   Allergic rhinitis 07/07/2016   Anemia 07/07/2016   Anxiety 07/07/2016   Arthritis    Osteoarthritis   ATRIAL FIBRILLATION 11/27/2008   Qualifier: Diagnosis of  By: Genevie Cheshire PharmD, Sally     Atypical chest pain 07/07/2016   Benign essential hypertension 07/07/2016   Overview:  stable on current meds   Benign paroxysmal positional vertigo 07/07/2016   Overview:  Avoid rapid headmovement and position change. May taper the gabapentin to see if helps.   Bilateral low back pain without sciatica 07/07/2016   Overview:  continue gabapentin and  meloxicam,   Bimalleolar ankle fracture 02/21/2014   Bronchitis 07/07/2016   Carpal tunnel syndrome    CHF (congestive heart failure) ([place])    08-2013   Chronic anticoagulation 12/03/2014   Chronic diastolic heart failure ([place]) 12/03/2014   CKD (chronic kidney disease) stage 3, GFR 30-59 ml/min (HCC) 11/21/2014   COPD (chronic obstructive pulmonary disease) ([place]) 07/07/2016   DM2 (diabetes mellitus, type 2) (HCC)    Edema, peripheral 07/07/2016   Overview:  increase lasix to 40 mg bid   Enthesopathy 07/07/2016   Esophagitis, reflux 07/07/2016   Essential hypertension 05/06/2015   Functional diarrhea 07/07/2016   GERD (gastroesophageal reflux disease)    Headache 07/07/2016   Heart failure ([place])    History of diverticulosis 07/07/2016   HLD (hyperlipidemia)    HTN (hypertension)    Hyperlipidemia 05/06/2015   Hypertensive heart disease 07/07/2016   Overview:  stable on current meds   Hypertensive heart disease with heart failure ([place]) 12/03/2014   Hypothyroid 07/07/2016   Hypothyroidism    Irritable bowel syndrome 07/07/2016   Overview:  IBS information sheet provided,  dietary changes suggested   Low back pain 07/07/2016   Overview:  [facility]   Major depressive disorder, recurrent, moderate ([place]) 07/07/2016   Microalbuminuria due to type 2 diabetes mellitus ([place]) 05/06/2015   Midline low back pain without sciatica 07/07/2016   Overview:  --RTC for warning signs (Fevers, chills, loss of bowel/bladder function, saddle numbness), worsening pain or failure for pain to improve with symptomatic treatment --reviewed natural course of disease that acute LBP takes 4-6 weeks to recover --cont meds listed for symptomatic relief --follow up in 2-4 weeks or earlier if warning signs develop   Mixed dyslipidemia 03/17/2019   Nerve pain 07/07/2016   Obesity    Obesity (BMI 30-39.9) 05/06/2015   OSA (obstructive sleep apnea) 07/07/2016   Osteoarthritis 07/07/2016   Other ventral hernia 07/07/2016    Overweight 07/07/2016   Pacemaker 11/21/2014   PAF (paroxysmal atrial fibrillation) (HCC)    Paroxysmal atrial fibrillation ([place]) 11/27/2008   CHADS2 vasc=4    Presence of permanent cardiac pacemaker    09-24-13 01-21-14 Last interrogation in Epic at [facility]   Rhinosinusitis 07/07/2016   Seasonal allergies 07/07/2016   Shortness of breath 04/11/2019   Sleep apnea    No cpap used   Smoker 07/07/2016   SSS (sick sinus syndrome) ([place]) 11/21/2014   Symptomatic menopausal or female climacteric states 07/07/2016   Thoracic back pain 07/07/2016   Type II or unspecified type diabetes mellitus with renal manifestations, uncontrolled 07/07/2016    Family History:  Family History  Problem Relation Age of Onset   Dementia Mother    Heart attack Father    Diabetes Sister    Dementia Sister  Diabetes Brother      Social History: resides in [place]. Lives independently. Denies alcohol, tobacco or illicit substance use.   Review of Systems: A complete ROS was negative except as per HPI.   Physical Exam: Blood pressure (!) 127/92, pulse 73, temperature 97.9 F (36.6 C), temperature source Oral, resp. rate 18, height 5\' 4"  (1.626 m), weight 94.8 kg, last menstrual period 02/26/2005, SpO2 97 %.  Gen: Well-developed, well-nourished, in no distress HENT: NAT, hearing intact, No external lesion of nose Eyes: Conjunctiva clear, sclera non-icteric Neck: Supple, with no lesions CV: No murmurs, rubs or gallops, regular rate and rhythm Pulm: CTAB Abd: No tenderness, no organomegaly or hernia Extm: BLE, peripheral pulses intact Neuro: CN 2-12 normal  EKG: First degree block with PR interval of 228 ms  CT Abd/ Pelvis: Fluid in colon consistent with diarrheal illness. Possible endometrial stripe thickening up to 1 cm.  Consider follow up with OBGYN.  Assessment & Plan by Problem: Active Problems:   AKI (acute kidney injury) [facility])  Patient presents with a past medical history of paroxysmal  atrial fibrillation with history of ablation in 2017, sick sinus syndrome with a pacemaker in place, CHF, DM, Chronic Kidney Disease, hypothyroidism, HTN, HLD, OSA, presents with history of chest pain since 07/23/20.  She is admitted due to AKI on CKD secondary to volume depletion from acute gastritis.  Acute Kidney Injury on CKD secondary to Volume Depletion from Acute Gastritis -Creatinine on 07/25/20 at 1.92.  Baseline creatinine of 0.97, will continue IVF.  -Follow up on CMP. She was hypokalemic on admission.  -Pt follows with Dr. Audie Clear with [facility] for CKD stage 2.  Acute Gastritis -Follow-up on C diff and GI panel sent in ED. WBC elevated at 14, most likely a reactive process.  -Advancing diet as tolerated  Paroxysmal Atrial Fibrillation -Continue Apixaban 5 mg bid and Metoprolol 100 mg bid -CHADSVASC score of 5 -hx of ablation in 2017 -Follows with Dr. Bettina Gavia at [facility] Pacemaker in place   COPD -Continue Symbicort, Fluticasone and albuterol  CHF  Orthostatic Hypotension - Continue Midodrine 5mg  tid  Diabetes Mellitus -SSI until patient able to tolerate diet -Will restart on home medications of semaglutide and [place].  Hyperlipidemia -Continue Atorvastatin 20 mg  Major Depressive Disorder -Continue Escitalopram 20 mg   Hypothyroidism -Continue Levothroxine 175 mcg  Peripheral Neuropathy -Continue Gabapentin 300 mg bid   Diet: Soft IVF: NS VTE: pt on chronic anticoagulation with Apixaban CODE: FULL  Dispo: Admit patient to Observation with expected length of stay less than 2 midnights.  Signed: Christiana Fuchs, DO 07/25/2020, 11:07 PM  Pager: [phone] After 5pm on weekdays and 1pm on weekends: On Call pager: [phone]

## 2020-07-25 NOTE — ED Triage Notes (Signed)
Pt bib [facility] from an urgent care. Pt experienced generalized weakness yesterday, and woke up with heavy chest pressure this morning. Pt also experienced SOB.  Pt's initial BP with EMS was 84/40, went up to 112/70 after given 265ml bolus. Hx of A fib. Pacemaker placed in 2014.   Cbg 180  HR 82  RA 100%

## 2020-07-25 NOTE — Progress Notes (Signed)
Patient admitted through ED with chest pain generalized weakness after ongoing 4 days of diarrhea. On arrival, patient AOx4 and MAE. Vital signs taken WNL. Oriented her to the room and unit. Made her comfortable and call bell placed with reach.

## 2020-07-25 NOTE — ED Provider Notes (Signed)
[facility] EMERGENCY DEPARTMENT Provider Note   CSN: [id number] Arrival date & time: 07/25/20  1110     History Chief Complaint  Patient presents with   Chest Pain    Carolyn [name] is a 70 y.o. female.  HPI  70 year old female with past medical history of paroxysmal atrial fibrillation, sick sinus syndrome with a pacemaker in place, HTN, HLD, CHF, CKD presents emergency department with chest pain.  Patient states for the last 4 days she has been suffering from fatigue and diarrhea.  When she woke up this morning she felt chest heaviness around 8 AM.  She states that this was constant for couple hours and then resolved, currently it is resolved.  She has had intermittent shortness of breath and extreme fatigue with having to go back and forth from the restroom for diarrhea multiple times.  No documented fever.  No genitourinary symptoms.  No productive cough or leg swelling.  Past Medical History:  Diagnosis Date   Acquired hypothyroidism 07/07/2016   Acute gastritis 07/07/2016   Acute pain of right knee 09/20/2019   Allergic rhinitis 07/07/2016   Anemia 07/07/2016   Anxiety 07/07/2016   Arthritis    Osteoarthritis   ATRIAL FIBRILLATION 11/27/2008   Qualifier: Diagnosis of  By: Genevie Cheshire PharmD, Sally     Atypical chest pain 07/07/2016   Benign essential hypertension 07/07/2016   Overview:  stable on current meds   Benign paroxysmal positional vertigo 07/07/2016   Overview:  Avoid rapid headmovement and position change. May taper the gabapentin to see if helps.   Bilateral low back pain without sciatica 07/07/2016   Overview:  continue gabapentin and meloxicam,   Bimalleolar ankle fracture 02/21/2014   Bronchitis 07/07/2016   Carpal tunnel syndrome    CHF (congestive heart failure) ([place])    08-2013   Chronic anticoagulation 12/03/2014   Chronic diastolic heart failure ([place]) 12/03/2014   CKD (chronic kidney disease) stage 3, GFR 30-59 ml/min (HCC) 11/21/2014   COPD  (chronic obstructive pulmonary disease) ([place]) 07/07/2016   DM2 (diabetes mellitus, type 2) (HCC)    Edema, peripheral 07/07/2016   Overview:  increase lasix to 40 mg bid   Enthesopathy 07/07/2016   Esophagitis, reflux 07/07/2016   Essential hypertension 05/06/2015   Functional diarrhea 07/07/2016   GERD (gastroesophageal reflux disease)    Headache 07/07/2016   Heart failure ([place])    History of diverticulosis 07/07/2016   HLD (hyperlipidemia)    HTN (hypertension)    Hyperlipidemia 05/06/2015   Hypertensive heart disease 07/07/2016   Overview:  stable on current meds   Hypertensive heart disease with heart failure ([place]) 12/03/2014   Hypothyroid 07/07/2016   Hypothyroidism    Irritable bowel syndrome 07/07/2016   Overview:  IBS information sheet provided,  dietary changes suggested   Low back pain 07/07/2016   Overview:  lumbar spine films at [facility]   Major depressive disorder, recurrent, moderate ([place]) 07/07/2016   Microalbuminuria due to type 2 diabetes mellitus ([place]) 05/06/2015   Midline low back pain without sciatica 07/07/2016   Overview:  --RTC for warning signs (Fevers, chills, loss of bowel/bladder function, saddle numbness), worsening pain or failure for pain to improve with symptomatic treatment --reviewed natural course of disease that acute LBP takes 4-6 weeks to recover --cont meds listed for symptomatic relief --follow up in 2-4 weeks or earlier if warning signs develop   Mixed dyslipidemia 03/17/2019   Nerve pain 07/07/2016   Obesity    Obesity (BMI 30-39.9)  05/06/2015   OSA (obstructive sleep apnea) 07/07/2016   Osteoarthritis 07/07/2016   Other ventral hernia 07/07/2016   Overweight 07/07/2016   Pacemaker 11/21/2014   PAF (paroxysmal atrial fibrillation) (HCC)    Paroxysmal atrial fibrillation ([place]) 11/27/2008   CHADS2 vasc=4    Presence of permanent cardiac pacemaker    09-24-13 01-21-14 Last interrogation in Epic at [place]   Rhinosinusitis 07/07/2016   Seasonal allergies  07/07/2016   Shortness of breath 04/11/2019   Sleep apnea    No cpap used   Smoker 07/07/2016   SSS (sick sinus syndrome) ([place]) 11/21/2014   Symptomatic menopausal or female climacteric states 07/07/2016   Thoracic back pain 07/07/2016   Type II or unspecified type diabetes mellitus with renal manifestations, uncontrolled 07/07/2016    Patient Active Problem List   Diagnosis Date Noted   Heart failure ([place])    HTN (hypertension)    HLD (hyperlipidemia)    Hypothyroidism    Sleep apnea    Presence of permanent cardiac pacemaker    PAF (paroxysmal atrial fibrillation) ([place])    Obesity    DM2 (diabetes mellitus, type 2) (HCC)    CHF (congestive heart failure) ([place])    Arthritis    Acute pain of right knee 09/20/2019   Shortness of breath 04/11/2019   Encounter for annual physical examination excluding gynecological examination in a patient older than 17 years 03/17/2019   Mixed dyslipidemia 03/17/2019   Great toe pain, left 05/25/2018   Mild CAD 04/06/2018   Diarrhea due to malabsorption 08/17/2017   Abnormal mammogram 05/10/2017   Acquired hypothyroidism 07/07/2016   Acute gastritis 07/07/2016   Allergic rhinitis 07/07/2016   Anemia 07/07/2016   Anxiety 07/07/2016   Atypical chest pain 07/07/2016   Hypertensive heart disease 07/07/2016   Benign paroxysmal positional vertigo 07/07/2016   Bilateral low back pain without sciatica 07/07/2016   Bronchitis 07/07/2016   Carpal tunnel syndrome 07/07/2016   COPD (chronic obstructive pulmonary disease) ([place]) 07/07/2016   Edema, peripheral 07/07/2016   Enthesopathy 07/07/2016   Esophagitis, reflux 07/07/2016   Functional diarrhea 07/07/2016   GERD (gastroesophageal reflux disease) 07/07/2016   Headache 07/07/2016   History of diverticulosis 07/07/2016   Hypothyroid 07/07/2016   Irritable bowel syndrome 07/07/2016   Low back pain 07/07/2016   Major depressive disorder, recurrent, moderate ([place]) 07/07/2016   Midline low back pain  without sciatica 07/07/2016   Nerve pain 07/07/2016   OSA (obstructive sleep apnea) 07/07/2016   Osteoarthritis 07/07/2016   Other ventral hernia 07/07/2016   Overweight 07/07/2016   Rhinosinusitis 07/07/2016   Seasonal allergies 07/07/2016   Smoker 07/07/2016   Symptomatic menopausal or female climacteric states 07/07/2016   Thoracic back pain 07/07/2016   Type II or unspecified type diabetes mellitus with renal manifestations, uncontrolled 07/07/2016   Benign essential hypertension 07/07/2016   Essential hypertension 05/06/2015   Hyperlipidemia 05/06/2015   Microalbuminuria due to type 2 diabetes mellitus ([place]) 05/06/2015   Obesity (BMI 30-39.9) 05/06/2015   Chronic anticoagulation 12/03/2014   Chronic diastolic heart failure ([place]) 12/03/2014   Hypertensive heart disease with heart failure ([place]) 12/03/2014   CKD (chronic kidney disease) stage 3, GFR 30-59 ml/min ([place]) 11/21/2014   Pacemaker 11/21/2014   SSS (sick sinus syndrome) ([place]) 11/21/2014   Bimalleolar ankle fracture 02/21/2014   Paroxysmal atrial fibrillation ([place]) 11/27/2008   ATRIAL FIBRILLATION 11/27/2008    Past Surgical History:  Procedure Laterality Date   BREAST LUMPECTOMY WITH RADIOACTIVE SEED LOCALIZATION Right 08/02/2017  Procedure: BREAST LUMPECTOMY WITH RADIOACTIVE SEED LOCALIZATION;  Surgeon: Erroll Luna, MD;  Location: [place];  Service: General;  Laterality: Right;   CARPAL TUNNEL RELEASE     CHOLECYSTECTOMY     HERNIA REPAIR     ORIF ANKLE FRACTURE Left 02/21/2014   Procedure: OPEN REDUCTION INTERNAL FIXATION (ORIF) LEFT  ANKLE FRACTURE;  Surgeon: Tobi Bastos, MD;  Location: [facility];  Service: Orthopedics;  Laterality: Left;   PACEMAKER INSERTION  09-24-13   TOTAL HIP ARTHROPLASTY     TUBAL LIGATION       OB History   No obstetric history on file.     Family History  Problem Relation Age of Onset   Heart failure Other    Diabetes Sister    Diabetes Brother     Social History    Tobacco Use   Smoking status: Former    Years: 2.00    Pack years: 0.00    Types: Cigarettes    Quit date: 11/25/2008    Years since quitting: 11.6   Smokeless tobacco: Never  Vaping Use   Vaping Use: Never used  Substance Use Topics   Alcohol use: No   Drug use: No    Home Medications Prior to Admission medications   Medication Sig Start Date End Date Taking? Authorizing Provider  albuterol (PROVENTIL HFA;VENTOLIN HFA) 108 (90 Base) MCG/ACT inhaler Inhale 2 puffs into the lungs every 6 (six) hours as needed for wheezing or shortness of breath.    [provider]  apixaban (ELIQUIS) 5 MG TABS tablet Take 1 tablet (5 mg total) by mouth 2 (two) times daily. 12/28/19   Richardo Priest, MD  atorvastatin (LIPITOR) 20 MG tablet TAKE 1 TABLET BY MOUTH EVERYDAY AT BEDTIME 03/27/20   Richardo Priest, MD  empagliflozin (JARDIANCE) 25 MG TABS tablet Take 25 mg by mouth daily. 11/14/19   [provider]  escitalopram (LEXAPRO) 20 MG tablet Take 20 mg by mouth daily.    [provider]  fluticasone (FLONASE) 50 MCG/ACT nasal spray Place 1 spray into both nostrils as needed for allergies or rhinitis.    [provider]  gabapentin (NEURONTIN) 300 MG capsule Take 1 capsule by mouth 2 (two) times daily.  03/19/19   [provider]  glucose blood test strip 1 strip by Misc.(Non-Drug; Combo Route) route 2 times daily. 03/31/17   [provider]  Insulin Pen Needle 32G X 4 MM MISC Use pen needles as directed for diabetic medications 12/19/17   [provider]  isosorbide mononitrate (IMDUR) 30 MG 24 hr tablet TAKE 1 TABLET BY MOUTH EVERY DAY 03/20/20   Richardo Priest, MD  levothyroxine (SYNTHROID) 175 MCG tablet Take 175 mcg by mouth every morning. 08/28/19   [provider]  metoprolol tartrate (LOPRESSOR) 100 MG tablet TAKE 0.5 TABLETS (50 MG TOTAL) BY MOUTH 2 (TWO) TIMES DAILY. 05/08/20   Richardo Priest, MD  midodrine (PROAMATINE) 5 MG  tablet TAKE 1 TABLET (5 MG TOTAL) BY MOUTH 3 (THREE) TIMES DAILY WITH MEALS. 11/14/19   Richardo Priest, MD  omega-3 acid ethyl esters (LOVAZA) 1 g capsule Take 2 capsules (2 g total) by mouth 2 (two) times daily. 05/15/19   Richardo Priest, MD  Semaglutide, 1 MG/DOSE, (OZEMPIC, 1 MG/DOSE,) 4 MG/3ML SOPN Inject 1 mg into the skin once a week. 09/24/19   [provider]  SYMBICORT 160-4.5 MCG/ACT inhaler Inhale 2 puffs into the lungs 2 (two) times daily  as needed for shortness of breath. 07/04/17   [provider]  traMADol (ULTRAM) 50 MG tablet Take 50 mg by mouth every 8 (eight) hours as needed for pain. 05/06/20   [provider]  TRESIBA FLEXTOUCH 100 UNIT/ML FlexTouch Pen Inject 80 Units into the skin daily. 05/21/20   [provider]  Vitamin D, Ergocalciferol, (DRISDOL) 1.25 MG (50000 UT) CAPS capsule TAKE ONE CAPSULE BY MOUTH ONE TIME PER WEEK 09/19/17   [provider]    Allergies    Meperidine and Hydrocodone-acetaminophen  Review of Systems   Review of Systems  Constitutional:  Positive for fatigue. Negative for chills and fever.  HENT:  Negative for congestion.   Eyes:  Negative for visual disturbance.  Respiratory:  Positive for shortness of breath.   Cardiovascular:  Positive for chest pain. Negative for palpitations and leg swelling.  Gastrointestinal:  Positive for diarrhea. Negative for abdominal pain, blood in stool and vomiting.  Genitourinary:  Negative for dysuria.  Skin:  Negative for rash.  Neurological:  Negative for light-headedness and headaches.   Physical Exam Updated Vital Signs BP 95/72   Pulse 78   Temp 98.5 F (36.9 C)   Resp 16   Ht 5\' 4"  (1.626 m)   Wt 93 kg   LMP 02/26/2005   SpO2 94%   BMI 35.19 kg/m   Physical Exam Vitals and nursing note reviewed.  Constitutional:      Appearance: Normal appearance.  HENT:     Head: Normocephalic.     Mouth/Throat:     Mouth: Mucous membranes are moist.   Cardiovascular:     Rate and Rhythm: Normal rate.  Pulmonary:     Effort: Pulmonary effort is normal. No respiratory distress.     Breath sounds: Examination of the right-lower field reveals decreased breath sounds and rales. Examination of the left-lower field reveals decreased breath sounds and rales. Decreased breath sounds and rales present.  Abdominal:     Palpations: Abdomen is soft.     Tenderness: There is no abdominal tenderness.  Musculoskeletal:     Right lower leg: No edema.     Left lower leg: No edema.  Skin:    General: Skin is warm.  Neurological:     Mental Status: She is alert and oriented to person, place, and time. Mental status is at baseline.  Psychiatric:        Mood and Affect: Mood normal.    ED Results / Procedures / Treatments   Labs (all labs ordered are listed, but only abnormal results are displayed) Labs Reviewed  RESP PANEL BY RT-PCR (FLU A&B, COVID) ARPGX2  CBC WITH DIFFERENTIAL/PLATELET  LIPASE, BLOOD  COMPREHENSIVE METABOLIC PANEL  MAGNESIUM  BRAIN NATRIURETIC PEPTIDE  TROPONIN I (HIGH SENSITIVITY)    EKG EKG Interpretation  Date/Time:  Friday July 25 2020 11:18:17 EDT Ventricular Rate:  78 PR Interval:  228 QRS Duration: 102 QT Interval:  365 QTC Calculation: 416 R Axis:   31 Text Interpretation: Sinus or ectopic atrial rhythm Prolonged PR interval Inferior infarct, age indeterminate Sinus rhythm, first degree block Confirmed by Lavenia Atlas [phone]) on 07/25/2020 11:35:31 AM  Radiology No results found.  Procedures Procedures   Medications Ordered in ED Medications  sodium chloride 0.9 % bolus 500 mL (500 mLs Intravenous New Bag/Given 07/25/20 1223)    ED Course  I have reviewed the triage vital signs and the nursing notes.  Pertinent labs & imaging results that were available during my  care of the patient were reviewed by me and considered in my medical decision making (see chart for details).    MDM  Rules/Calculators/A&P                          70 year old female presents to the emergency department with chest heaviness/fatigue/shortness of breath that started this morning at 8 AM.  She has had profuse diarrhea for the past 4 days and has felt significantly fatigued, especially with ambulation and exertion having to go back and forth from the bathroom.  No blood in the diarrhea.  She has diffuse intermittent abdominal cramping but no specific abdominal pain.  Currently she has no chest discomfort.  Blood pressure is on the soft side on arrival but otherwise vitals are normal.  Abdomen is soft but mildly uncomfortable to palpation.  EKG looks similar to previous, she does have PR prolongation and slight downsloping of the ST segment in the lateral leads but no other acute ischemic changes.  Troponins are negative with minimal delta.  Chest x-ray is clear.  Blood work shows a new AKI with hypokalemia and elevated bilirubin/ALT.  Plan for CT of the abdomen and pelvis to rule out any acute/surgical pathology.  Following this plan for admission for AKI, hypokalemia in the setting of ongoing diarrhea. Patient signed out pending CT.  Final Clinical Impression(s) / ED Diagnoses Final diagnoses:  None    Rx / DC Orders ED Discharge Orders     None        Lorelle Gibbs, DO 07/25/20 1558

## 2020-07-25 NOTE — Hospital Course (Addendum)
Ms. Carolyn [name] is a 70 yo female PMHx of CHF, pAF, SSS s/p pacemaker placement, CKD3, COPD, T2DM, OSA who presented to the ED with 5d hx of n/v/d found to have an AKI. Patient also complained of chest pain on exertion for the last 3 days.   AKI on CKD 3, hypokalemia--likely in setting of dehydration 2/2 GI sx -s/p 2.5L -cont maintenance fluids -additional 1L fluid bolus given 7/2  -replete electrolytes  N/v/d--likely viral gastroenteritis--improving over the past couple days. C Diff negative -GI panel sent by EDP -ADAT  Diastolic heart failure--euvolemic on exam PAF, hx of SSS s/p pacemaker--in sinus rhythm  -continue metoprolol for rate control EKG 7/2 with 1st Degree AV blood--PR interval 228  History of orthostasis -on midodrine TID  COPD--chronic and stable. Continue symbicort and albuterol  T2DM -holding home meds in the setting of poor oral intake -sensitive SSI, resume home meds as able  Hypothyroidism. Continue synthroid.

## 2020-07-25 NOTE — ED Notes (Signed)
Floor nurse made aware that patient will be coming to the floor and will remain on our stretcher until they are able to locate a bed for the room. House supervisor is aware and approves.

## 2020-07-26 DIAGNOSIS — N179 Acute kidney failure, unspecified: Principal | ICD-10-CM

## 2020-07-26 DIAGNOSIS — E876 Hypokalemia: Secondary | ICD-10-CM | POA: Diagnosis not present

## 2020-07-26 DIAGNOSIS — N189 Chronic kidney disease, unspecified: Secondary | ICD-10-CM

## 2020-07-26 LAB — CBC
HCT: 48 % — ABNORMAL HIGH (ref 36.0–46.0)
Hemoglobin: 16.4 g/dL — ABNORMAL HIGH (ref 12.0–15.0)
MCH: 30.4 pg (ref 26.0–34.0)
MCHC: 34.2 g/dL (ref 30.0–36.0)
MCV: 88.9 fL (ref 80.0–100.0)
Platelets: 242 10*3/uL (ref 150–400)
RBC: 5.4 MIL/uL — ABNORMAL HIGH (ref 3.87–5.11)
RDW: 13.5 % (ref 11.5–15.5)
WBC: 14 10*3/uL — ABNORMAL HIGH (ref 4.0–10.5)
nRBC: 0 % (ref 0.0–0.2)

## 2020-07-26 LAB — URINALYSIS, ROUTINE W REFLEX MICROSCOPIC
Bacteria, UA: NONE SEEN
Bilirubin Urine: NEGATIVE
Glucose, UA: 150 mg/dL — AB
Hgb urine dipstick: NEGATIVE
Ketones, ur: NEGATIVE mg/dL
Nitrite: NEGATIVE
Protein, ur: 30 mg/dL — AB
Specific Gravity, Urine: 1.009 (ref 1.005–1.030)
pH: 6 (ref 5.0–8.0)

## 2020-07-26 LAB — BASIC METABOLIC PANEL
Anion gap: 7 (ref 5–15)
BUN: 32 mg/dL — ABNORMAL HIGH (ref 8–23)
CO2: 15 mmol/L — ABNORMAL LOW (ref 22–32)
Calcium: 8.2 mg/dL — ABNORMAL LOW (ref 8.9–10.3)
Chloride: 114 mmol/L — ABNORMAL HIGH (ref 98–111)
Creatinine, Ser: 1.5 mg/dL — ABNORMAL HIGH (ref 0.44–1.00)
GFR, Estimated: 38 mL/min — ABNORMAL LOW (ref >60)
Glucose, Bld: 172 mg/dL — ABNORMAL HIGH (ref 70–99)
Potassium: 3.1 mmol/L — ABNORMAL LOW (ref 3.5–5.1)
Sodium: 136 mmol/L (ref 135–145)

## 2020-07-26 LAB — C DIFFICILE QUICK SCREEN W PCR REFLEX
C Diff antigen: NEGATIVE
C Diff interpretation: NOT DETECTED
C Diff toxin: NEGATIVE

## 2020-07-26 LAB — COMPREHENSIVE METABOLIC PANEL
ALT: 68 U/L — ABNORMAL HIGH (ref 0–44)
AST: 35 U/L (ref 15–41)
Albumin: 3.5 g/dL (ref 3.5–5.0)
Alkaline Phosphatase: 78 U/L (ref 38–126)
Anion gap: 8 (ref 5–15)
BUN: 37 mg/dL — ABNORMAL HIGH (ref 8–23)
CO2: 15 mmol/L — ABNORMAL LOW (ref 22–32)
Calcium: 8.4 mg/dL — ABNORMAL LOW (ref 8.9–10.3)
Chloride: 113 mmol/L — ABNORMAL HIGH (ref 98–111)
Creatinine, Ser: 1.76 mg/dL — ABNORMAL HIGH (ref 0.44–1.00)
GFR, Estimated: 31 mL/min — ABNORMAL LOW (ref >60)
Glucose, Bld: 130 mg/dL — ABNORMAL HIGH (ref 70–99)
Potassium: 3.2 mmol/L — ABNORMAL LOW (ref 3.5–5.1)
Sodium: 136 mmol/L (ref 135–145)
Total Bilirubin: 2.5 mg/dL — ABNORMAL HIGH (ref 0.3–1.2)
Total Protein: 6.3 g/dL — ABNORMAL LOW (ref 6.5–8.1)

## 2020-07-26 LAB — HIV ANTIBODY (ROUTINE TESTING W REFLEX): HIV Screen 4th Generation wRfx: NONREACTIVE

## 2020-07-26 LAB — GLUCOSE, CAPILLARY
Glucose-Capillary: 113 mg/dL — ABNORMAL HIGH (ref 70–99)
Glucose-Capillary: 144 mg/dL — ABNORMAL HIGH (ref 70–99)

## 2020-07-26 LAB — MAGNESIUM: Magnesium: 1.7 mg/dL (ref 1.7–2.4)

## 2020-07-26 MED ORDER — LACTATED RINGERS IV BOLUS
1000.0000 mL | Freq: Once | INTRAVENOUS | Status: AC
  Administered 2020-07-26: 1000 mL via INTRAVENOUS

## 2020-07-26 MED ORDER — POTASSIUM CHLORIDE CRYS ER 20 MEQ PO TBCR
40.0000 meq | EXTENDED_RELEASE_TABLET | ORAL | Status: AC
  Administered 2020-07-26 (×2): 40 meq via ORAL
  Filled 2020-07-26 (×2): qty 2

## 2020-07-26 MED ORDER — POTASSIUM CHLORIDE CRYS ER 20 MEQ PO TBCR
40.0000 meq | EXTENDED_RELEASE_TABLET | Freq: Once | ORAL | Status: AC
  Administered 2020-07-26: 40 meq via ORAL
  Filled 2020-07-26: qty 2

## 2020-07-26 NOTE — Progress Notes (Signed)
Patient has ordered for discharge. Given discharge instructions with paper to the patient. Iv removed. Given all belongings to the patient. 

## 2020-07-26 NOTE — Discharge Instructions (Addendum)
You were hospitalized for a gastrointestinal illness. Also you had what doctors call an acute kidney injury, this is a sign of dehydration from your gastrointestinal illness on your lab work.  This improved with IV fluids.  We recommend following up with your primary care doctor in 1 week to have your lab work checked.  We also obtained a GI panel and will follow-up these results and call you if we need to prescribe further treatment. Thank you for allowing [place] to be part of your care.   We did not make any changes to your home medication regimen.   Please call our clinic if you have any questions or concerns, we may be able to help and keep you from a long and expensive emergency room wait. Our clinic and after hours phone number is [phone], the best time to call is Monday through Friday 9 am to 4 pm but there is always someone available 24/7 if you have an emergency.

## 2020-07-26 NOTE — Discharge Summary (Signed)
Name: Carolyn [name] MRN: [id number] DOB: Jun 01, 1950 70 y.o. PCP: Myrlene Broker, MD  Date of Admission: 07/25/2020 11:10 AM Date of Discharge:  Attending Physician: Velna Ochs, MD  Discharge Diagnosis: 1. Acute Kidney Injury on CKD secondary to Volume Depletion from Acute GI Illness 2. Viral Gastroenteritis 3. Hypokalemia 4. Elevated Hgb  Discharge Medications: Allergies as of 07/26/2020       Reactions   Meperidine Nausea And Vomiting, Other (See Comments)   Was given patch behind her ear and was better   Hydrocodone-acetaminophen Other (See Comments)   REACTION: Patient states she doesnt break in hives or anything. Just feels bad when she takes it.        Medication List     STOP taking these medications    isosorbide mononitrate 30 MG 24 hr tablet Commonly known as: IMDUR       TAKE these medications    acetaminophen 500 MG tablet Commonly known as: TYLENOL Take 1,000 mg by mouth every 6 (six) hours as needed for mild pain.   albuterol 108 (90 Base) MCG/ACT inhaler Commonly known as: VENTOLIN HFA Inhale 2 puffs into the lungs every 6 (six) hours as needed for wheezing or shortness of breath.   apixaban 5 MG Tabs tablet Commonly known as: Eliquis Take 1 tablet (5 mg total) by mouth 2 (two) times daily.   atorvastatin 20 MG tablet Commonly known as: LIPITOR TAKE 1 TABLET BY MOUTH EVERYDAY AT BEDTIME What changed: See the new instructions.   cyclobenzaprine 10 MG tablet Commonly known as: FLEXERIL Take 10 mg by mouth 3 (three) times daily as needed for muscle spasms.   empagliflozin 25 MG Tabs tablet Commonly known as: JARDIANCE Take 25 mg by mouth daily.   escitalopram 20 MG tablet Commonly known as: LEXAPRO Take 20 mg by mouth daily.   fluticasone 50 MCG/ACT nasal spray Commonly known as: FLONASE Place 1 spray into both nostrils as needed for allergies or rhinitis.   gabapentin 300 MG capsule Commonly known as: NEURONTIN Take 300  mg by mouth 2 (two) times daily.   glucose blood test strip 1 strip by Misc.(Non-Drug; Combo Route) route 2 times daily.   Insulin Pen Needle 32G X 4 MM Misc Use pen needles as directed for diabetic medications   levothyroxine 175 MCG tablet Commonly known as: SYNTHROID Take 175 mcg by mouth every morning.   metoprolol tartrate 100 MG tablet Commonly known as: LOPRESSOR TAKE 0.5 TABLETS (50 MG TOTAL) BY MOUTH 2 (TWO) TIMES DAILY.   midodrine 5 MG tablet Commonly known as: PROAMATINE TAKE 1 TABLET (5 MG TOTAL) BY MOUTH 3 (THREE) TIMES DAILY WITH MEALS.   omega-3 acid ethyl esters 1 g capsule Commonly known as: LOVAZA Take 2 capsules (2 g total) by mouth 2 (two) times daily.   Ozempic (1 MG/DOSE) 4 MG/3ML Sopn Generic drug: Semaglutide (1 MG/DOSE) Inject 1 mg into the skin once a week. Monday   Symbicort 160-4.5 MCG/ACT inhaler Generic drug: budesonide-formoterol Inhale 2 puffs into the lungs 2 (two) times daily as needed for shortness of breath.   Tyler Aas FlexTouch 100 UNIT/ML FlexTouch Pen Generic drug: insulin degludec Inject 80 Units into the skin daily.   Vitamin D (Ergocalciferol) 1.25 MG (50000 UNIT) Caps capsule Commonly known as: DRISDOL Take 50,000 Units by mouth every 7 (seven) days. Sunday        Disposition and follow-up:   Carolyn [name] was discharged from [facility] in Stable condition.  At the hospital follow up visit please address:  1.  Acute Kidney Injury on CKD secondary to Volume Depletion from Acute Gastritis, Viral Gastroenterits, Hypokalemia, Elevated Hemoglobin, incidental finding of thickened endometrial stripe  2.  Labs / imaging needed at time of follow-up: BMP to follow up on kidney function and electrolytes  3.  Pending labs/ test needing follow-up: follow up on EPO  Follow-up Appointments:  Follow-up Information     Myrlene Broker, MD. Schedule an appointment as soon as possible for a visit in 1 week(s).    Specialty: Family Medicine Contact information: [place]  [id number] [phone]         Constance Haw, MD .   Specialty: Cardiology Contact information: [place] [address] [place] Hospital Course by problem list:  1. Acute Kidney Injury on CKD secondary to Volume Depletion from Acute GI Illness Patient follows with Dr. Audie Clear with [facility] for CKD stage 2. On arrival to the ED the patient was noted to have labs significant for Cr of 1.92 which is elevated above her baseline of 1.0. Her AKI is likely pre-renal due to hypovolemia in the setting of recent GI fluid loss from acute GI illness. Patient received 2.5L of fluids in the ED and received another 1L bolus the following morning. She also received maintenance fluids while in the hospital. With fluid resuscitation her creatine did improve to 1.5 prior to discharge. She was deemed ready for discharge and discharged on 7/2. Patient was counseled to increase her PO fluid intake at home. Patient will follow up with PCP to follow up on Creatinine levels to ensure she is fully back to baseline.    2. Viral Gastroenteritis Patient presented to ED with 5 day history of nausea, vomiting, and diarrhea. Patient was having 7 liquid Bms per day earlier in the week but her frequency has decreased the last two days. Patient having one BM per day of firm stool at discharge. Patient is tolerating PO and has not had nausea or vomiting while on our service. She did not require PRNs for nausea. Given symptoms and time course this was likely a self limiting viral gastroenteritis. We will follow up on GI panel.    3. Hypokalemia in the setting of acute GI Illness Patient's potassium was 3.1 on initial labs in the ED. This is likely due to GI fluid losses from acute gastroenteritis. Patient's potassium stayed stable at 3.1. We gave additional potassium repletion prior to  discharge. Patient will follow up with PCP to follow up on electrolyte levels.   4. Chest Pain on Exertion Hx of pAfib s/p ablation in 2017, SSS with pacemaker in place  Patient presented with 3 day history of chest pain when walking. In the ED the Patients EKG showed first degree block with PR interval of 267ms. Patient's troponins were normal. CHADSVASC score of 5. Patient's chest rsolved in the ED after fluid resuscitation, suggesting possible demand ischemia 2/2 hypovolemia. She has remained in sinus rhythm this hospitalization. Patient was continued on home Apixaban 5mg  BID and Metoprolol 100 mg BID. The patient follows with Dr. Bettina [facility]avia at [facility]. Patient will continue with her home medications for anticoagulation and rate control after discharge.   5. Diastolic Heart Failure Patient has history of diastolic heart failure and is not currently on diuretics or other heart failure medications  at home. Patient appeared euvolemic on exam. She did not require diuresis while in the hospital.   6. History of Orthostatic Hypotension  Patient was continued on midodrine for blood pressure support. Patient denied dizziness when transitioning from lying to standing or during   7. COPD Patient has chronic, stable COPD. Patient has not had respiratory complaints this hospitalization. We continued her Albuterol PRN and Breo Ellipta daily during her hospital stay. She has not required any PRN albuterol. She will continue her home COPD mediations after discharge.  8. Diabetes Mellitus Patient on Semaglutide and Tresiba at home. Last Hgb A1c 06/04/2019 11.6 (H). Patient was started on SSI for blood glucose management while on our service.   9. Hyperlipidemia Patient was continued on home medication, Atorvastatin 20 mg, this hospitalization  10. Major Depressive Disorder Patient was continued on home medication, Escitalopram 20 mg, this hospitalization  11. Hypothyroidism Patient was  continued on home medication, Levothyroxine 175mg , this hospitalization  12. Peripheral Neuropathy Patient was continued on home medication, Gabapentin 300 mg BID, this hospitalization  13. Incidental Finding on CT ABD PEV of thickened endometrial stripe Patient should schedule follow up with OBGYN to further workup incidental findings.  14. Elevated Hemoglobin EPO level ordered. Can be worked up in outpatient setting if EPO outside normal limits. Could also be due to fluid loss with concentrated blood.  Subjective: Patient was seen and examined this morning with the patient lying in bed. Patient reports she is feeling better. She endorses only one BM overnight that was firm. She denies nausea and has not needed nausea PRNs.She is tolerating PO intake. She denies abdominal pain. She has been able to ambulate around the room without dizziness.    Discharge Exam:   BP (!) 101/57 (BP Location: Left Arm)   Pulse 72   Temp 98 F (36.7 C) (Oral)   Resp 16   Ht 5\' 4"  (1.626 m)   Wt 94.7 kg   LMP 02/26/2005   SpO2 97%   BMI 35.86 kg/m  Discharge exam:  Gen: Well-developed, well-nourished, in no distress HENT: NAT, hearing intact, No external lesion of nose Eyes: Conjunctiva clear, sclera non-icteric Neck: Supple, with no lesions CV: No murmurs, rubs or gallops, regular rate and rhythm Pulm: CTAB Abd: No tenderness, no organomegaly or hernia Extm: BLE, peripheral pulses intact Neuro: CN 2-12 normal  Pertinent Labs, Studies, and Procedures:  CBC Latest Ref Rng & Units 07/26/2020 07/25/2020 09/07/2018  WBC 4.0 - 10.5 K/uL 14.0(H) 14.4(H) 8.4  Hemoglobin 12.0 - 15.0 g/dL 16.4(H) 18.2(H) 13.2  Hematocrit 36.0 - 46.0 % 48.0(H) 53.3(H) 40.8  Platelets 150 - 400 K/uL 242 287 232    BMP Latest Ref Rng & Units 07/26/2020 07/26/2020 07/25/2020  Glucose 70 - 99 mg/dL 172(H) 130(H) 128(H)  BUN 8 - 23 mg/dL 32(H) 37(H) 39(H)  Creatinine 0.44 - 1.00 mg/dL 1.50(H) 1.76(H) 1.92(H)  BUN/Creat Ratio 12 -  28 - - -  Sodium 135 - 145 mmol/L 136 136 135  Potassium 3.5 - 5.1 mmol/L 3.1(L) 3.2(L) 3.1(L)  Chloride 98 - 111 mmol/L 114(H) 113(H) 111  CO2 22 - 32 mmol/L 15(L) 15(L) 15(L)  Calcium 8.9 - 10.3 mg/dL 8.2(L) 8.4(L) 8.8(L)    CT Abdomen Pelvis Wo Contrast  Result Date: 07/25/2020 CLINICAL DATA:  Diarrhea weakness EXAM: CT ABDOMEN AND PELVIS WITHOUT CONTRAST TECHNIQUE: Multidetector CT imaging of the abdomen and pelvis was performed following the standard protocol without IV contrast. COMPARISON:  CT 11/01/2014 FINDINGS: Lower chest: Lung  bases demonstrate linear scarring or atelectasis at the right base. No acute consolidation or effusion. Partially visualized intracardiac pacing leads. Hepatobiliary: Status post cholecystectomy. Similar slight prominence of extrahepatic common bile duct likely due to postsurgical change. Pancreas: Unremarkable. No pancreatic ductal dilatation or surrounding inflammatory changes. Spleen: Normal in size without focal abnormality. Adrenals/Urinary Tract: Adrenal glands are unremarkable. Kidneys are normal, without renal calculi, focal lesion, or hydronephrosis. Bladder is unremarkable. Stomach/Bowel: Stomach nonenlarged. Scattered fluid-filled small bowel without dilatation. Fluid within the colon. No acute bowel wall thickening. Nonvisualized appendix Vascular/Lymphatic: Mild aortic atherosclerosis. No aneurysm. No suspicious nodes Reproductive: No adnexal mass possible endometrial stripe thickening up to 1 cm. Other: Negative for pelvic effusion or free air Musculoskeletal: No acute or significant osseous findings. IMPRESSION: 1. No CT evidence for acute intra-abdominal or pelvic abnormality. Fluid in the colon consistent with diarrheal illness. No wall thickening to suggest colitis. 2. Possible endometrial stripe thickening up to 1 cm. Consider correlation with nonemergent ultrasound Electronically Signed   By: Donavan Foil M.D.   On: 07/25/2020 17:16   DG Chest Port 1  View  Result Date: 07/25/2020 CLINICAL DATA:  Short of breath and chest pressure EXAM: PORTABLE CHEST 1 VIEW COMPARISON:  04/22/2020 FINDINGS: Dual lead pacemaker unchanged. Heart size and vascularity normal. Lungs are clear without infiltrate or effusion. No interval change. IMPRESSION: No active disease. Electronically Signed   By: Franchot Gallo M.D.   On: 07/25/2020 12:36     Discharge Instructions: Discharge Instructions     Diet - low sodium heart healthy   Complete by: As directed    Increase activity slowly   Complete by: As directed        Signed: Wayland Denis, MD 07/26/2020, 3:01 PM   Pager: [phone]

## 2020-07-27 LAB — GASTROINTESTINAL PANEL BY PCR, STOOL (REPLACES STOOL CULTURE)

## 2020-07-29 LAB — ERYTHROPOIETIN: Erythropoietin: 9.8 m[IU]/mL (ref 2.6–18.5)

## 2020-08-07 DIAGNOSIS — N179 Acute kidney failure, unspecified: Secondary | ICD-10-CM | POA: Diagnosis not present

## 2020-08-07 DIAGNOSIS — N189 Chronic kidney disease, unspecified: Secondary | ICD-10-CM | POA: Diagnosis not present

## 2020-08-07 DIAGNOSIS — Z09 Encounter for follow-up examination after completed treatment for conditions other than malignant neoplasm: Secondary | ICD-10-CM | POA: Diagnosis not present

## 2020-08-07 DIAGNOSIS — R9389 Abnormal findings on diagnostic imaging of other specified body structures: Secondary | ICD-10-CM | POA: Diagnosis not present

## 2020-08-07 DIAGNOSIS — K2970 Gastritis, unspecified, without bleeding: Secondary | ICD-10-CM | POA: Diagnosis not present

## 2020-08-07 DIAGNOSIS — E876 Hypokalemia: Secondary | ICD-10-CM | POA: Diagnosis not present

## 2020-08-11 ENCOUNTER — Telehealth: Payer: Self-pay | Admitting: Cardiology

## 2020-08-11 ENCOUNTER — Other Ambulatory Visit: Payer: Self-pay

## 2020-08-11 ENCOUNTER — Ambulatory Visit (INDEPENDENT_AMBULATORY_CARE_PROVIDER_SITE_OTHER): Payer: PPO

## 2020-08-11 VITALS — BP 129/81 | Ht 63.5 in | Wt 221.6 lb

## 2020-08-11 DIAGNOSIS — I110 Hypertensive heart disease with heart failure: Secondary | ICD-10-CM | POA: Diagnosis not present

## 2020-08-11 DIAGNOSIS — I951 Orthostatic hypotension: Secondary | ICD-10-CM | POA: Diagnosis not present

## 2020-08-11 DIAGNOSIS — I5032 Chronic diastolic (congestive) heart failure: Secondary | ICD-10-CM

## 2020-08-11 NOTE — Telephone Encounter (Signed)
Pt c/o Syncope: STAT if syncope occurred within 30 minutes and pt complains of lightheadedness High Priority if episode of passing out, completely, today or in last 24 hours   Did you pass out today? no  When is the last time you passed out? Patient has not passed out.   Has this occurred multiple times? Yes   Did you have any symptoms prior to passing out? Patient said she just gets dizzy and her legs get weak.   STAT if patient feels like he/she is going to faint   Are you dizzy now? No   Do you feel faint or have you passed out? No.  Do you have any other symptoms?  Patient gets weak at the legs and has to sit down but has not passed out. This usually happens upon standing   Have you checked your HR and BP (record if available)? Patient has a machine at home but does not have any readings  Patient said this has happened the last two sundays she was at [facility]. When it happened yesterday she was able to sit down. Last week her Brother had to catch her and help her to the floor because she was not near a chair. She was wondering if her BP meds need to be adjusted

## 2020-08-11 NOTE — Telephone Encounter (Signed)
Spoke to the patient just now and let her know these recommendations. She is going to come in today for the nurse visit and labs. She is going to do a pacemaker download at this time as well.

## 2020-08-11 NOTE — Addendum Note (Signed)
Addended by: Resa Miner I on: 08/11/2020 11:10 AM   Modules accepted: Orders

## 2020-08-11 NOTE — Telephone Encounter (Signed)
Spoke to the patient just now and she let me know that she has been getting very dizzy and is having to sit down so that she does not fall. This has only happened the past two Sunday's while she was at church. She states that she has been staying very hydrated. She is taking her midodrine 5 mg three times daily. She has not taken her blood pressure today nor has she taken it recently. She took her blood pressure for me while I was on the phone with her and her blood pressure was 174/95 sitting and standing 142/78. She denies any other symptoms such as SOB, Chest pain, or any kind of swelling.   She states that on Thursday when she went to her PCP they did orthostatic vitals and she was told that they were normal.   She is concerned that her "salt level" might be low and that is what is causing this but I advised that on 07/26/2020 her sodium was normal.   I will route to Dr. Bettina Gavia to get his recommendations as the patient is stable at this time and is not having any symptoms.

## 2020-08-11 NOTE — Progress Notes (Signed)
Patient came in for orthostatic vitals per Dr. Joya Gaskins recommendation (See phone note from earlier today). Patients blood pressure sitting is 129/81, lying 133/85, and standing 112/72.   Patient denies any symptoms. She is not dizzy, lightheaded, SOB, and does not have any pain.   I will send this over to Dr. Bettina Gavia to review upon his return to the office tomorrow.

## 2020-08-12 ENCOUNTER — Telehealth: Payer: Self-pay

## 2020-08-12 LAB — BASIC METABOLIC PANEL
BUN/Creatinine Ratio: 11 — ABNORMAL LOW (ref 12–28)
BUN: 14 mg/dL (ref 8–27)
CO2: 23 mmol/L (ref 20–29)
Calcium: 9 mg/dL (ref 8.7–10.3)
Chloride: 100 mmol/L (ref 96–106)
Creatinine, Ser: 1.22 mg/dL — ABNORMAL HIGH (ref 0.57–1.00)
Glucose: 238 mg/dL — ABNORMAL HIGH (ref 65–99)
Potassium: 4.4 mmol/L (ref 3.5–5.2)
Sodium: 140 mmol/L (ref 134–144)
eGFR: 48 mL/min/{1.73_m2} — ABNORMAL LOW (ref >59)

## 2020-08-12 LAB — CBC
Hematocrit: 43.6 % (ref 34.0–46.6)
Hemoglobin: 14.2 g/dL (ref 11.1–15.9)
MCH: 30.3 pg (ref 26.6–33.0)
MCHC: 32.6 g/dL (ref 31.5–35.7)
MCV: 93 fL (ref 79–97)
Platelets: 246 10*3/uL (ref 150–450)
RBC: 4.69 x10E6/uL (ref 3.77–5.28)
RDW: 13.1 % (ref 11.7–15.4)
WBC: 8.8 10*3/uL (ref 3.4–10.8)

## 2020-08-12 NOTE — Telephone Encounter (Signed)
-----   Message from Richardo Priest, MD sent at 08/12/2020 12:00 PM EDT ----- Kermit Balo results no changes ----- Message ----- From: Gita Kudo, RN Sent: 08/11/2020   2:30 PM EDT To: Richardo Priest, MD  Patient came in for orthostatic vitals per your recommendation (See phone note from earlier today). Patients blood pressure sitting is 129/81, lying 133/85, and standing 112/72.  Patient denies any symptoms. She is not dizzy, lightheaded, SOB, and does not have any pain.  Labs were completed today as well.   Please review upon your return to the office tomorrow.

## 2020-08-12 NOTE — Telephone Encounter (Signed)
Spoke with patient regarding results and recommendation.  Patient verbalizes understanding and is agreeable to plan of care. Advised patient to call back with any issues or concerns.  

## 2020-08-20 ENCOUNTER — Other Ambulatory Visit: Payer: Self-pay | Admitting: Cardiology

## 2020-08-20 DIAGNOSIS — E1165 Type 2 diabetes mellitus with hyperglycemia: Secondary | ICD-10-CM | POA: Diagnosis not present

## 2020-08-20 DIAGNOSIS — R5383 Other fatigue: Secondary | ICD-10-CM | POA: Diagnosis not present

## 2020-08-20 DIAGNOSIS — Z794 Long term (current) use of insulin: Secondary | ICD-10-CM | POA: Diagnosis not present

## 2020-08-20 DIAGNOSIS — E039 Hypothyroidism, unspecified: Secondary | ICD-10-CM | POA: Diagnosis not present

## 2020-08-28 DIAGNOSIS — N182 Chronic kidney disease, stage 2 (mild): Secondary | ICD-10-CM | POA: Diagnosis not present

## 2020-09-02 DIAGNOSIS — Z794 Long term (current) use of insulin: Secondary | ICD-10-CM | POA: Diagnosis not present

## 2020-09-02 DIAGNOSIS — N182 Chronic kidney disease, stage 2 (mild): Secondary | ICD-10-CM | POA: Diagnosis not present

## 2020-09-02 DIAGNOSIS — I129 Hypertensive chronic kidney disease with stage 1 through stage 4 chronic kidney disease, or unspecified chronic kidney disease: Secondary | ICD-10-CM | POA: Diagnosis not present

## 2020-09-02 DIAGNOSIS — E1165 Type 2 diabetes mellitus with hyperglycemia: Secondary | ICD-10-CM | POA: Diagnosis not present

## 2020-09-02 DIAGNOSIS — Z6838 Body mass index (BMI) 38.0-38.9, adult: Secondary | ICD-10-CM | POA: Diagnosis not present

## 2020-09-02 DIAGNOSIS — E1122 Type 2 diabetes mellitus with diabetic chronic kidney disease: Secondary | ICD-10-CM | POA: Diagnosis not present

## 2020-09-02 DIAGNOSIS — Z7984 Long term (current) use of oral hypoglycemic drugs: Secondary | ICD-10-CM | POA: Diagnosis not present

## 2020-09-02 DIAGNOSIS — Z87891 Personal history of nicotine dependence: Secondary | ICD-10-CM | POA: Diagnosis not present

## 2020-09-15 DIAGNOSIS — R9389 Abnormal findings on diagnostic imaging of other specified body structures: Secondary | ICD-10-CM | POA: Diagnosis not present

## 2020-09-25 DIAGNOSIS — N858 Other specified noninflammatory disorders of uterus: Secondary | ICD-10-CM | POA: Diagnosis not present

## 2020-09-25 DIAGNOSIS — R9389 Abnormal findings on diagnostic imaging of other specified body structures: Secondary | ICD-10-CM | POA: Diagnosis not present

## 2020-10-02 DIAGNOSIS — M1711 Unilateral primary osteoarthritis, right knee: Secondary | ICD-10-CM | POA: Diagnosis not present

## 2020-10-20 ENCOUNTER — Encounter: Payer: Self-pay | Admitting: Cardiology

## 2020-10-20 ENCOUNTER — Ambulatory Visit (INDEPENDENT_AMBULATORY_CARE_PROVIDER_SITE_OTHER): Payer: PPO | Admitting: Cardiology

## 2020-10-20 ENCOUNTER — Other Ambulatory Visit: Payer: Self-pay

## 2020-10-20 VITALS — BP 130/82 | HR 90 | Ht 63.5 in | Wt 226.0 lb

## 2020-10-20 DIAGNOSIS — I495 Sick sinus syndrome: Secondary | ICD-10-CM

## 2020-10-20 NOTE — Progress Notes (Signed)
Electrophysiology Office Note   Date:  10/20/2020   ID:  Carolyn [name], DOB 10-May-1950, MRN [id number]  PCP:  Myrlene Broker, MD  Cardiologist:  Bettina Gavia Primary Electrophysiologist:  Hildagard Sobecki Meredith Leeds, MD    Chief Complaint  Patient presents with   Atrial Fibrillation      History of Present Illness: Carolyn [name] is a 70 y.o. female who is being seen today for the evaluation of sick sinus syndrome at the request of Shirlee More. Presenting today for electrophysiology evaluation.    She has a history significant for paroxysmal atrial fibrillation, hypertension, diastolic heart failure, and hyperlipidemia.  She had ablation for atrial fibrillation December 2017.  She was initially on Multaq but this was stopped in 2018.  She is status post Medtronic dual-chamber pacemaker implanted for sick sinus syndrome.  Today, denies symptoms of palpitations, chest pain, shortness of breath, orthopnea, PND, lower extremity edema, claudication, dizziness, presyncope, syncope, bleeding, or neurologic sequela. The patient is tolerating medications without difficulties.  Since being seen she has done well.  She has no chest pain or shortness of breath.  She is able to all of her daily activities.  Unfortunately she is having significant knee pain between the fourth radius.  Aside from that, she has no major complaints.   Past Medical History:  Diagnosis Date   Acquired hypothyroidism 07/07/2016   Acute gastritis 07/07/2016   Acute pain of right knee 09/20/2019   Allergic rhinitis 07/07/2016   Anemia 07/07/2016   Anxiety 07/07/2016   Arthritis    Osteoarthritis   ATRIAL FIBRILLATION 11/27/2008   Qualifier: Diagnosis of  By: Genevie Cheshire PharmD, Sally     Atypical chest pain 07/07/2016   Benign essential hypertension 07/07/2016   Overview:  stable on current meds   Benign paroxysmal positional vertigo 07/07/2016   Overview:  Avoid rapid headmovement and position change. May taper the gabapentin to see  if helps.   Bilateral low back pain without sciatica 07/07/2016   Overview:  continue gabapentin and meloxicam,   Bimalleolar ankle fracture 02/21/2014   Bronchitis 07/07/2016   Carpal tunnel syndrome    CHF (congestive heart failure) ([place])    08-2013   Chronic anticoagulation 12/03/2014   Chronic diastolic heart failure ([place]) 12/03/2014   CKD (chronic kidney disease) stage 3, GFR 30-59 ml/min (HCC) 11/21/2014   COPD (chronic obstructive pulmonary disease) ([place]) 07/07/2016   DM2 (diabetes mellitus, type 2) (HCC)    Edema, peripheral 07/07/2016   Overview:  increase lasix to 40 mg bid   Enthesopathy 07/07/2016   Esophagitis, reflux 07/07/2016   Essential hypertension 05/06/2015   Functional diarrhea 07/07/2016   GERD (gastroesophageal reflux disease)    Headache 07/07/2016   Heart failure ([place])    History of diverticulosis 07/07/2016   HLD (hyperlipidemia)    HTN (hypertension)    Hyperlipidemia 05/06/2015   Hypertensive heart disease 07/07/2016   Overview:  stable on current meds   Hypertensive heart disease with heart failure ([place]) 12/03/2014   Hypothyroid 07/07/2016   Hypothyroidism    Irritable bowel syndrome 07/07/2016   Overview:  IBS information sheet provided,  dietary changes suggested   Low back pain 07/07/2016   Overview:  lumbar spine films at [facility]   Major depressive disorder, recurrent, moderate ([place]) 07/07/2016   Microalbuminuria due to type 2 diabetes mellitus ([place]) 05/06/2015   Midline low back pain without sciatica 07/07/2016   Overview:  --RTC for warning signs (Fevers, chills, loss of bowel/bladder function, saddle  numbness), worsening pain or failure for pain to improve with symptomatic treatment --reviewed natural course of disease that acute LBP takes 4-6 weeks to recover --cont meds listed for symptomatic relief --follow up in 2-4 weeks or earlier if warning signs develop   Mixed dyslipidemia 03/17/2019   Nerve pain 07/07/2016   Obesity    Obesity (BMI 30-39.9) 05/06/2015   OSA  (obstructive sleep apnea) 07/07/2016   Osteoarthritis 07/07/2016   Other ventral hernia 07/07/2016   Overweight 07/07/2016   Pacemaker 11/21/2014   PAF (paroxysmal atrial fibrillation) (HCC)    Paroxysmal atrial fibrillation ([place]) 11/27/2008   CHADS2 vasc=4    Presence of permanent cardiac pacemaker    09-24-13 01-21-14 Last interrogation in Epic at [facility]   Rhinosinusitis 07/07/2016   Seasonal allergies 07/07/2016   Shortness of breath 04/11/2019   Sleep apnea    No cpap used   Smoker 07/07/2016   SSS (sick sinus syndrome) ([place]) 11/21/2014   Symptomatic menopausal or female climacteric states 07/07/2016   Thoracic back pain 07/07/2016   Type II or unspecified type diabetes mellitus with renal manifestations, uncontrolled 07/07/2016   Past Surgical History:  Procedure Laterality Date   BREAST LUMPECTOMY WITH RADIOACTIVE SEED LOCALIZATION Right 08/02/2017   Procedure: BREAST LUMPECTOMY WITH RADIOACTIVE SEED LOCALIZATION;  Surgeon: Erroll Luna, MD;  Location: [facility];  Service: General;  Laterality: Right;   CARPAL TUNNEL RELEASE     CHOLECYSTECTOMY     HERNIA REPAIR     ORIF ANKLE FRACTURE Left 02/21/2014   Procedure: OPEN REDUCTION INTERNAL FIXATION (ORIF) LEFT  ANKLE FRACTURE;  Surgeon: Tobi Bastos, MD;  Location: [facility];  Service: Orthopedics;  Laterality: Left;   PACEMAKER INSERTION  09-24-13   TOTAL HIP ARTHROPLASTY     TUBAL LIGATION       Current Outpatient Medications  Medication Sig Dispense Refill   acetaminophen (TYLENOL) 500 MG tablet Take 1,000 mg by mouth every 6 (six) hours as needed for mild pain.     albuterol (PROVENTIL HFA;VENTOLIN HFA) 108 (90 Base) MCG/ACT inhaler Inhale 2 puffs into the lungs every 6 (six) hours as needed for wheezing or shortness of breath.     apixaban (ELIQUIS) 5 MG TABS tablet Take 1 tablet (5 mg total) by mouth 2 (two) times daily. 180 tablet 3   atorvastatin (LIPITOR) 20 MG tablet TAKE 1 TABLET BY MOUTH EVERYDAY AT BEDTIME 90  tablet 2   empagliflozin (JARDIANCE) 25 MG TABS tablet Take 25 mg by mouth daily.     escitalopram (LEXAPRO) 20 MG tablet Take 20 mg by mouth daily.     fluticasone (FLONASE) 50 MCG/ACT nasal spray Place 1 spray into both nostrils as needed for allergies or rhinitis.     gabapentin (NEURONTIN) 300 MG capsule Take 300 mg by mouth 2 (two) times daily.     glucose blood test strip 1 strip by Misc.(Non-Drug; Combo Route) route 2 times daily.     Insulin Pen Needle 32G X 4 MM MISC Use pen needles as directed for diabetic medications     levothyroxine (SYNTHROID) 175 MCG tablet Take 175 mcg by mouth every morning.     metoprolol tartrate (LOPRESSOR) 100 MG tablet TAKE 0.5 TABLETS (50 MG TOTAL) BY MOUTH 2 (TWO) TIMES DAILY. 90 tablet 3   midodrine (PROAMATINE) 5 MG tablet TAKE 1 TABLET (5 MG TOTAL) BY MOUTH 3 (THREE) TIMES DAILY WITH MEALS. 270 tablet 2   omega-3 acid ethyl esters (LOVAZA) 1 g capsule Take  2 capsules (2 g total) by mouth 2 (two) times daily. 360 capsule 1   Semaglutide, 1 MG/DOSE, (OZEMPIC, 1 MG/DOSE,) 4 MG/3ML SOPN Inject 1 mg into the skin once a week. Monday     SYMBICORT 160-4.5 MCG/ACT inhaler Inhale 2 puffs into the lungs 2 (two) times daily as needed for shortness of breath.  1   TRESIBA FLEXTOUCH 100 UNIT/ML FlexTouch Pen Inject 80 Units into the skin daily.     Vitamin D, Ergocalciferol, (DRISDOL) 1.25 MG (50000 UT) CAPS capsule Take 50,000 Units by mouth every 7 (seven) days. Sunday     cyclobenzaprine (FLEXERIL) 10 MG tablet Take 10 mg by mouth 3 (three) times daily as needed for muscle spasms. (Patient not taking: Reported on 10/20/2020)     No current facility-administered medications for this visit.    Allergies:   Meperidine and Hydrocodone-acetaminophen   Social History:  The patient  reports that she quit smoking about 11 years ago. Her smoking use included cigarettes. She has never used smokeless tobacco. She reports that she does not drink alcohol and does not use  drugs.   Family History:  The patient's family history includes Dementia in her mother and sister; Diabetes in her brother and sister; Heart attack in her father.   ROS:  Please see the history of present illness.   Otherwise, review of systems is positive for none.   All other systems are reviewed and negative.   PHYSICAL EXAM: VS:  BP 130/82   Pulse 90   Ht 5' 3.5" (1.613 m)   Wt 226 lb (102.5 kg)   LMP 02/26/2005   SpO2 94%   BMI 39.41 kg/m  , BMI Body mass index is 39.41 kg/m. GEN: Well nourished, well developed, in no acute distress  HEENT: normal  Neck: no JVD, carotid bruits, or masses Cardiac: RRR; no murmurs, rubs, or gallops,no edema  Respiratory:  clear to auscultation bilaterally, normal work of breathing GI: soft, nontender, nondistended, + BS MS: no deformity or atrophy  Skin: warm and dry, device site well healed Neuro:  Strength and sensation are intact Psych: euthymic mood, full affect  EKG:  EKG is not ordered today. Personal review of the ekg ordered 07/25/20 shows sinus rhythm, first-degree block  Personal review of the device interrogation today. Results in [place]: 07/25/2020: B Natriuretic Peptide 27.2 07/26/2020: ALT 68; Magnesium 1.7 08/11/2020: BUN 14; Creatinine, Ser 1.22; Hemoglobin 14.2; Platelets 246; Potassium 4.4; Sodium 140    Lipid Panel     Component Value Date/Time   CHOL 150 09/07/2018 0919   TRIG 427 (H) 09/07/2018 0919   HDL 31 (L) 09/07/2018 0919   CHOLHDL 4.8 (H) 09/07/2018 0919   LDLCALC Comment 09/07/2018 0919     Wt Readings from Last 3 Encounters:  10/20/20 226 lb (102.5 kg)  08/11/20 221 lb 9.6 oz (100.5 kg)  07/26/20 208 lb 14.2 oz (94.7 kg)      Other studies Reviewed: Additional studies/ records that were reviewed today include: TTE 2017  Review of the above records today demonstrates:  Normal left ventricular size and systolic function with no appreciable segmental abnormality. EF 55% There was no  evidence of spontaneous echo contrast or thrombus in the left atrium or left atrial appendage. No evidence of patent foramen ovale by doppler evaluation .   ASSESSMENT AND PLAN:  1.  Paroxysmal atrial fibrillation: Currently on metoprolol 100 mg twice daily, Eliquis 5 mg twice daily.  They are CHA2DS2-VASc  4.  She has had minimal episodes of atrial fibrillation.  She just we Wister Hoefle continue with current management.  2.  Sick sinus syndrome: Status post Medtronic dual-chamber pacemaker clinic device functioning appropriately.  No changes at this time.  3.  Hypertension: Currently well controlled  4.  Diastolic heart failure: No obvious volume overload  5.  Hyperlipidemia: Continue atorvastatin 20 mg daily.  Primary cardiology.   Current medicines are reviewed at length with the patient today.   The patient does not have concerns regarding her medicines.  The following changes were made today: None  Labs/ tests ordered today include:  No orders of the defined types were placed in this encounter.    Disposition:   FU with Edahi Kroening 1 year  Signed, Yusef Lamp Meredith Leeds, MD  10/20/2020 9:23 AM     [facility] [address] [place] [address] [phone] (office) [phone] (fax)

## 2020-10-20 NOTE — Patient Instructions (Signed)
Medication Instructions:  Your physician recommends that you continue on your current medications as directed. Please refer to the Current Medication list given to you today.  *If you need a refill on your cardiac medications before your next appointment, please call your pharmacy*   Lab Work: None ordered   Testing/Procedures: None ordered   Follow-Up: At [facility], you and your health needs are our priority.  As part of our continuing mission to provide you with exceptional heart care, we have created designated Provider Care Teams.  These Care Teams include your primary Cardiologist (physician) and Advanced Practice Providers (APPs -  Physician Assistants and Nurse Practitioners) who all work together to provide you with the care you need, when you need it.  Remote monitoring is used to monitor your Pacemaker or ICD from home. This monitoring reduces the number of office visits required to check your device to one time per year. It allows [place] to keep an eye on the functioning of your device to ensure it is working properly. You are scheduled for a device check from home on 01/05/2021. You may send your transmission at any time that day. If you have a wireless device, the transmission will be sent automatically. After your physician reviews your transmission, you will receive a postcard with your next transmission date.  Your next appointment:   1 year(s)  The format for your next appointment:   In Person  Provider:   Allegra Lai, MD   Thank you for choosing [facility]!!   Trinidad Curet, RN [phone]

## 2020-10-31 DIAGNOSIS — J309 Allergic rhinitis, unspecified: Secondary | ICD-10-CM | POA: Diagnosis not present

## 2020-11-05 DIAGNOSIS — R9389 Abnormal findings on diagnostic imaging of other specified body structures: Secondary | ICD-10-CM | POA: Diagnosis not present

## 2020-11-12 DIAGNOSIS — J0100 Acute maxillary sinusitis, unspecified: Secondary | ICD-10-CM | POA: Diagnosis not present

## 2020-11-12 DIAGNOSIS — Z20828 Contact with and (suspected) exposure to other viral communicable diseases: Secondary | ICD-10-CM | POA: Diagnosis not present

## 2020-11-12 DIAGNOSIS — J209 Acute bronchitis, unspecified: Secondary | ICD-10-CM | POA: Diagnosis not present

## 2020-11-12 DIAGNOSIS — R509 Fever, unspecified: Secondary | ICD-10-CM | POA: Diagnosis not present

## 2020-11-19 DIAGNOSIS — Z794 Long term (current) use of insulin: Secondary | ICD-10-CM | POA: Diagnosis not present

## 2020-11-19 DIAGNOSIS — Z79899 Other long term (current) drug therapy: Secondary | ICD-10-CM | POA: Diagnosis not present

## 2020-11-19 DIAGNOSIS — E039 Hypothyroidism, unspecified: Secondary | ICD-10-CM | POA: Diagnosis not present

## 2020-11-19 DIAGNOSIS — Z7985 Long-term (current) use of injectable non-insulin antidiabetic drugs: Secondary | ICD-10-CM | POA: Diagnosis not present

## 2020-11-19 DIAGNOSIS — Z7984 Long term (current) use of oral hypoglycemic drugs: Secondary | ICD-10-CM | POA: Diagnosis not present

## 2020-11-19 DIAGNOSIS — E1165 Type 2 diabetes mellitus with hyperglycemia: Secondary | ICD-10-CM | POA: Diagnosis not present

## 2020-11-24 ENCOUNTER — Other Ambulatory Visit: Payer: Self-pay | Admitting: Cardiology

## 2020-11-26 ENCOUNTER — Telehealth: Payer: Self-pay

## 2020-11-26 DIAGNOSIS — R1310 Dysphagia, unspecified: Secondary | ICD-10-CM | POA: Diagnosis not present

## 2020-11-26 DIAGNOSIS — Z7901 Long term (current) use of anticoagulants: Secondary | ICD-10-CM | POA: Diagnosis not present

## 2020-11-26 DIAGNOSIS — K649 Unspecified hemorrhoids: Secondary | ICD-10-CM | POA: Diagnosis not present

## 2020-11-26 DIAGNOSIS — R1013 Epigastric pain: Secondary | ICD-10-CM | POA: Diagnosis not present

## 2020-11-26 DIAGNOSIS — R17 Unspecified jaundice: Secondary | ICD-10-CM | POA: Diagnosis not present

## 2020-11-26 NOTE — Telephone Encounter (Addendum)
   Name: Carolyn [name]  DOB: 04-Apr-1950  MRN: [id number]   Primary Cardiologist: Shirlee More, MD  Chart reviewed as part of pre-operative protocol coverage. Patient was contacted 11/27/2020 in reference to pre-operative risk assessment for pending surgery as outlined below.  Carolyn [name] was last seen on 10/20/20 by Dr. Curt Bears.  Since that day, Carolyn [name] has done well.  She can complete 4.0 METS without angina. She had a reassuring stress test in June 2021.   Per our clinical pharmacist: Per office protocol, patient can hold Eliquis for 2 days prior to procedure.   Patient will not need bridging with Lovenox (enoxaparin) around procedure.    Therefore, based on ACC/AHA guidelines, the patient would be at acceptable risk for the planned procedure without further cardiovascular testing.   The patient was advised that if she develops new symptoms prior to surgery to contact our office to arrange for a follow-up visit, and she verbalized understanding.  I will route this recommendation to the requesting party via Epic fax function and remove from pre-op pool. Please call with questions.  Tami Lin Ardyn Forge, PA 11/27/2020, 7:30 AM

## 2020-11-26 NOTE — Telephone Encounter (Signed)
   [facility] Pre-operative Risk Assessment    Patient Name: Carolyn [name]  DOB: 11/18/1950 MRN: [id number]  HEARTCARE STAFF:  - IMPORTANT!!!!!! Under Visit Info/Reason for Call, type in Other and utilize the format Clearance MM/DD/YY or Clearance TBD. Do not use dashes or single digits. - Please review there is not already an duplicate clearance open for this procedure. - If request is for dental extraction, please clarify the # of teeth to be extracted. - If the patient is currently at the dentist's office, call Pre-Op Callback Staff (MA/nurse) to input urgent request.  - If the patient is not currently in the dentist office, please route to the Pre-Op pool.  Request for surgical clearance:  What type of surgery is being performed? Colonoscopy/EGD  When is this surgery scheduled? 12/30/20  What type of clearance is required (medical clearance vs. Pharmacy clearance to hold med vs. Both)? Both  Are there any medications that need to be held prior to surgery and how long? Eliquis on December 4th  Practice name and name of physician performing surgery? [facility]. Dr. Lyda Jester  What is the office phone number? [phone]   7.   What is the office fax number? [phone]  8.   Anesthesia type (None, local, MAC, general) ? General   Gita Kudo 11/26/2020, 12:58 PM  _________________________________________________________________   (provider comments below)

## 2020-11-26 NOTE — Telephone Encounter (Signed)
Patient with diagnosis of atrial fibrillation on Eliquis for anticoagulation.    Procedure: colonoscopy/EGD Date of procedure: 12/30/20   CHA2DS2-VASc Score = 6   This indicates a 9.7% annual risk of stroke. The patient's score is based upon: CHF History: 1 HTN History: 1 Diabetes History: 1 Stroke History: 0 Vascular Disease History: 1 Age Score: 1 Gender Score: 1   CrCl 51 (with adjusted body weight) Platelet count 246  Per office protocol, patient can hold Eliquis for 2 days prior to procedure.   Patient will not need bridging with Lovenox (enoxaparin) around procedure.

## 2020-12-03 DIAGNOSIS — R9389 Abnormal findings on diagnostic imaging of other specified body structures: Secondary | ICD-10-CM | POA: Diagnosis not present

## 2020-12-03 DIAGNOSIS — N858 Other specified noninflammatory disorders of uterus: Secondary | ICD-10-CM | POA: Diagnosis not present

## 2020-12-05 DIAGNOSIS — Z7901 Long term (current) use of anticoagulants: Secondary | ICD-10-CM | POA: Diagnosis not present

## 2020-12-05 DIAGNOSIS — I480 Paroxysmal atrial fibrillation: Secondary | ICD-10-CM | POA: Diagnosis not present

## 2020-12-05 DIAGNOSIS — Z87891 Personal history of nicotine dependence: Secondary | ICD-10-CM | POA: Diagnosis not present

## 2020-12-05 DIAGNOSIS — I5032 Chronic diastolic (congestive) heart failure: Secondary | ICD-10-CM | POA: Diagnosis not present

## 2020-12-05 DIAGNOSIS — E782 Mixed hyperlipidemia: Secondary | ICD-10-CM | POA: Diagnosis not present

## 2020-12-05 DIAGNOSIS — Z79899 Other long term (current) drug therapy: Secondary | ICD-10-CM | POA: Diagnosis not present

## 2020-12-05 DIAGNOSIS — I110 Hypertensive heart disease with heart failure: Secondary | ICD-10-CM | POA: Diagnosis not present

## 2020-12-05 DIAGNOSIS — K219 Gastro-esophageal reflux disease without esophagitis: Secondary | ICD-10-CM | POA: Diagnosis not present

## 2020-12-05 DIAGNOSIS — F331 Major depressive disorder, recurrent, moderate: Secondary | ICD-10-CM | POA: Diagnosis not present

## 2020-12-05 DIAGNOSIS — Z23 Encounter for immunization: Secondary | ICD-10-CM | POA: Diagnosis not present

## 2020-12-07 ENCOUNTER — Other Ambulatory Visit: Payer: Self-pay | Admitting: Cardiology

## 2020-12-11 ENCOUNTER — Telehealth: Payer: Self-pay

## 2020-12-11 NOTE — Telephone Encounter (Signed)
PAF renewal for Eliquis completed and faxed

## 2020-12-30 DIAGNOSIS — D122 Benign neoplasm of ascending colon: Secondary | ICD-10-CM | POA: Diagnosis not present

## 2020-12-30 DIAGNOSIS — Z1211 Encounter for screening for malignant neoplasm of colon: Secondary | ICD-10-CM | POA: Diagnosis not present

## 2020-12-30 DIAGNOSIS — K219 Gastro-esophageal reflux disease without esophagitis: Secondary | ICD-10-CM | POA: Diagnosis not present

## 2020-12-30 DIAGNOSIS — R1310 Dysphagia, unspecified: Secondary | ICD-10-CM | POA: Diagnosis not present

## 2020-12-30 DIAGNOSIS — K209 Esophagitis, unspecified without bleeding: Secondary | ICD-10-CM | POA: Diagnosis not present

## 2020-12-30 DIAGNOSIS — K635 Polyp of colon: Secondary | ICD-10-CM | POA: Diagnosis not present

## 2020-12-30 DIAGNOSIS — Z7901 Long term (current) use of anticoagulants: Secondary | ICD-10-CM | POA: Diagnosis not present

## 2020-12-30 DIAGNOSIS — K5730 Diverticulosis of large intestine without perforation or abscess without bleeding: Secondary | ICD-10-CM | POA: Diagnosis not present

## 2020-12-30 DIAGNOSIS — Z86010 Personal history of colonic polyps: Secondary | ICD-10-CM | POA: Diagnosis not present

## 2020-12-30 DIAGNOSIS — K644 Residual hemorrhoidal skin tags: Secondary | ICD-10-CM | POA: Diagnosis not present

## 2021-01-02 DIAGNOSIS — I10 Essential (primary) hypertension: Secondary | ICD-10-CM | POA: Diagnosis not present

## 2021-01-02 DIAGNOSIS — N858 Other specified noninflammatory disorders of uterus: Secondary | ICD-10-CM | POA: Diagnosis not present

## 2021-01-02 DIAGNOSIS — E119 Type 2 diabetes mellitus without complications: Secondary | ICD-10-CM | POA: Diagnosis not present

## 2021-01-02 DIAGNOSIS — R9389 Abnormal findings on diagnostic imaging of other specified body structures: Secondary | ICD-10-CM | POA: Diagnosis not present

## 2021-01-02 DIAGNOSIS — N840 Polyp of corpus uteri: Secondary | ICD-10-CM | POA: Diagnosis not present

## 2021-01-06 ENCOUNTER — Telehealth: Payer: Self-pay

## 2021-01-06 NOTE — Telephone Encounter (Signed)
The patient states her monitor was not working. I ordered her a new monitor and it will be delivered in 7-10 business days. I also ordered her a return kit for her old monitor. I told her she can send a transmission when she receives her new monitor.

## 2021-01-14 DIAGNOSIS — N9489 Other specified conditions associated with female genital organs and menstrual cycle: Secondary | ICD-10-CM | POA: Diagnosis not present

## 2021-01-14 DIAGNOSIS — R9389 Abnormal findings on diagnostic imaging of other specified body structures: Secondary | ICD-10-CM | POA: Diagnosis not present

## 2021-01-20 LAB — CUP PACEART REMOTE DEVICE CHECK
Battery Impedance: 381 Ohm
Battery Remaining Longevity: 110 mo
Battery Voltage: 2.79 V
Brady Statistic AP VP Percent: 0 %
Brady Statistic AP VS Percent: 31 %
Brady Statistic AS VP Percent: 0 %
Brady Statistic AS VS Percent: 69 %
Date Time Interrogation Session: 20221227105520
Implantable Lead Implant Date: 20150831
Implantable Lead Implant Date: 20150831
Implantable Lead Location: 753859
Implantable Lead Location: 753860
Implantable Lead Model: 4092
Implantable Lead Model: 5076
Implantable Pulse Generator Implant Date: 20150831
Lead Channel Impedance Value: 526 Ohm
Lead Channel Impedance Value: 597 Ohm
Lead Channel Pacing Threshold Amplitude: 0.625 V
Lead Channel Pacing Threshold Amplitude: 1.25 V
Lead Channel Pacing Threshold Pulse Width: 0.4 ms
Lead Channel Pacing Threshold Pulse Width: 0.4 ms
Lead Channel Setting Pacing Amplitude: 1.5 V
Lead Channel Setting Pacing Amplitude: 2.5 V
Lead Channel Setting Pacing Pulse Width: 0.4 ms
Lead Channel Setting Sensing Sensitivity: 2.8 mV

## 2021-01-21 ENCOUNTER — Ambulatory Visit (INDEPENDENT_AMBULATORY_CARE_PROVIDER_SITE_OTHER): Payer: PPO

## 2021-01-21 DIAGNOSIS — I495 Sick sinus syndrome: Secondary | ICD-10-CM

## 2021-02-02 NOTE — Progress Notes (Signed)
Remote pacemaker transmission.   

## 2021-02-04 ENCOUNTER — Ambulatory Visit: Payer: PPO | Admitting: Cardiology

## 2021-02-04 ENCOUNTER — Other Ambulatory Visit: Payer: Self-pay

## 2021-02-04 ENCOUNTER — Encounter: Payer: Self-pay | Admitting: Cardiology

## 2021-02-04 VITALS — BP 116/64 | HR 76 | Ht 63.5 in | Wt 212.2 lb

## 2021-02-04 DIAGNOSIS — N1831 Chronic kidney disease, stage 3a: Secondary | ICD-10-CM

## 2021-02-04 DIAGNOSIS — I110 Hypertensive heart disease with heart failure: Secondary | ICD-10-CM

## 2021-02-04 DIAGNOSIS — I5032 Chronic diastolic (congestive) heart failure: Secondary | ICD-10-CM

## 2021-02-04 DIAGNOSIS — I480 Paroxysmal atrial fibrillation: Secondary | ICD-10-CM

## 2021-02-04 DIAGNOSIS — I951 Orthostatic hypotension: Secondary | ICD-10-CM

## 2021-02-04 DIAGNOSIS — Z950 Presence of cardiac pacemaker: Secondary | ICD-10-CM

## 2021-02-04 DIAGNOSIS — E890 Postprocedural hypothyroidism: Secondary | ICD-10-CM

## 2021-02-04 DIAGNOSIS — I495 Sick sinus syndrome: Secondary | ICD-10-CM

## 2021-02-04 DIAGNOSIS — Z7901 Long term (current) use of anticoagulants: Secondary | ICD-10-CM

## 2021-02-04 MED ORDER — METOPROLOL TARTRATE 50 MG PO TABS: 50.0000 mg | ORAL_TABLET | Freq: Two times a day (BID) | ORAL | 3 refills | Status: DC

## 2021-02-04 NOTE — Progress Notes (Signed)
Cardiology Office Note:    Date:  02/04/2021   ID:  Carolyn [name], DOB May 30, 1950, MRN [id number]  PCP:  Myrlene Broker, MD  Cardiologist:  Shirlee More, MD    Referring MD: Myrlene Broker, MD    ASSESSMENT:    1. Paroxysmal atrial fibrillation (HCC)   2. SSS (sick sinus syndrome) ([place])   3. Pacemaker   4. Chronic anticoagulation   5. Hypertensive heart disease with chronic diastolic congestive heart failure (HCC)   6. Stage 3a chronic kidney disease ([place])   7. Orthostatic hypotension   8. Postablative hypothyroidism    PLAN:    In order of problems listed above:  Cardiology perspective She Is Doing Well Maintaining Sinus Rhythm overnight vein isolation and continue her beta-blocker and current anticoagulant Stable device function followed in our clinic BP at target she has had no symptomatic orthostatic hypotension we will continue monitoring and stable CKD At times of seeing individuals nonresponders to Synthroid.  Told her if her TSH remains severely elevated discussed taking active thyroid hormone Cytomel or referral to endocrinology   Next appointment: 9 months   Medication Adjustments/Labs and Tests Ordered: Current medicines are reviewed at length with the patient today.  Concerns regarding medicines are outlined above.  No orders of the defined types were placed in this encounter.  Meds ordered this encounter  Medications   metoprolol tartrate (LOPRESSOR) 50 MG tablet    Sig: Take 1 tablet (50 mg total) by mouth 2 (two) times daily.    Dispense:  180 tablet    Refill:  3    Chief Complaint  Patient presents with   Follow-up   Atrial Fibrillation    History of Present Illness:    Carolyn [name] is a 71 y.o. female with a hx of paroxysmal atrial fibrillation with cryoablation 2017 hypertension type 2 diabetes mellitus hypothyroidism and sinus node dysfunction dual-chamber pacemaker stage III CKD and symptomatic orthostatic hypotension   last  seen 06/27/2020.  Compliance with diet, lifestyle and medications: Yes  His Synthroid was increased to 200 mcg a day she still feels hypothyroid and fatigue and changes in skin nail care. Has had no breakthrough episodes of rapid heart rhythm compliant with her anticoagulant without bleeding intolerance or statin without muscle pain or weakness Has had no orthostatic lightheadedness edema shortness of breath chest pain or syncope She continues to follow in our pacemaker device clinic. Most recent labs 11/19/2020 [facility] PCP TSH 85.4 potassium 3.8 creatinine 0.95 hemoglobin 15.5 Past Medical History:  Diagnosis Date   Acquired hypothyroidism 07/07/2016   Acute gastritis 07/07/2016   Acute pain of right knee 09/20/2019   Allergic rhinitis 07/07/2016   Anemia 07/07/2016   Anxiety 07/07/2016   Arthritis    Osteoarthritis   ATRIAL FIBRILLATION 11/27/2008   Qualifier: Diagnosis of  By: Genevie Cheshire PharmD, Sally     Atypical chest pain 07/07/2016   Benign essential hypertension 07/07/2016   Overview:  stable on current meds   Benign paroxysmal positional vertigo 07/07/2016   Overview:  Avoid rapid headmovement and position change. May taper the gabapentin to see if helps.   Bilateral low back pain without sciatica 07/07/2016   Overview:  continue gabapentin and meloxicam,   Bimalleolar ankle fracture 02/21/2014   Bronchitis 07/07/2016   Carpal tunnel syndrome    CHF (congestive heart failure) ([place])    08-2013   Chronic anticoagulation 12/03/2014   Chronic diastolic heart failure ([place]) 12/03/2014   CKD (chronic  kidney disease) stage 3, GFR 30-59 ml/min (HCC) 11/21/2014   COPD (chronic obstructive pulmonary disease) ([place]) 07/07/2016   DM2 (diabetes mellitus, type 2) (HCC)    Edema, peripheral 07/07/2016   Overview:  increase lasix to 40 mg bid   Enthesopathy 07/07/2016   Esophagitis, reflux 07/07/2016   Essential hypertension 05/06/2015   Functional diarrhea 07/07/2016   GERD (gastroesophageal  reflux disease)    Headache 07/07/2016   Heart failure ([place])    History of diverticulosis 07/07/2016   HLD (hyperlipidemia)    HTN (hypertension)    Hyperlipidemia 05/06/2015   Hypertensive heart disease 07/07/2016   Overview:  stable on current meds   Hypertensive heart disease with heart failure ([place]) 12/03/2014   Hypothyroid 07/07/2016   Hypothyroidism    Irritable bowel syndrome 07/07/2016   Overview:  IBS information sheet provided,  dietary changes suggested   Low back pain 07/07/2016   Overview:  lumbar spine films at [facility]   Major depressive disorder, recurrent, moderate ([place]) 07/07/2016   Microalbuminuria due to type 2 diabetes mellitus ([place]) 05/06/2015   Midline low back pain without sciatica 07/07/2016   Overview:  --RTC for warning signs (Fevers, chills, loss of bowel/bladder function, saddle numbness), worsening pain or failure for pain to improve with symptomatic treatment --reviewed natural course of disease that acute LBP takes 4-6 weeks to recover --cont meds listed for symptomatic relief --follow up in 2-4 weeks or earlier if warning signs develop   Mixed dyslipidemia 03/17/2019   Nerve pain 07/07/2016   Obesity    Obesity (BMI 30-39.9) 05/06/2015   OSA (obstructive sleep apnea) 07/07/2016   Osteoarthritis 07/07/2016   Other ventral hernia 07/07/2016   Overweight 07/07/2016   Pacemaker 11/21/2014   PAF (paroxysmal atrial fibrillation) (HCC)    Paroxysmal atrial fibrillation ([place]) 11/27/2008   CHADS2 vasc=4    Presence of permanent cardiac pacemaker    09-24-13 01-21-14 Last interrogation in Epic at [facility]   Rhinosinusitis 07/07/2016   Seasonal allergies 07/07/2016   Shortness of breath 04/11/2019   Sleep apnea    No cpap used   Smoker 07/07/2016   SSS (sick sinus syndrome) ([place]) 11/21/2014   Symptomatic menopausal or female climacteric states 07/07/2016   Thoracic back pain 07/07/2016   Type II or unspecified type diabetes mellitus with renal manifestations, uncontrolled  07/07/2016    Past Surgical History:  Procedure Laterality Date   BREAST LUMPECTOMY WITH RADIOACTIVE SEED LOCALIZATION Right 08/02/2017   Procedure: BREAST LUMPECTOMY WITH RADIOACTIVE SEED LOCALIZATION;  Surgeon: Erroll Luna, MD;  Location: [place];  Service: General;  Laterality: Right;   CARPAL TUNNEL RELEASE     CHOLECYSTECTOMY     HERNIA REPAIR     ORIF ANKLE FRACTURE Left 02/21/2014   Procedure: OPEN REDUCTION INTERNAL FIXATION (ORIF) LEFT  ANKLE FRACTURE;  Surgeon: Tobi Bastos, MD;  Location: [facility];  Service: Orthopedics;  Laterality: Left;   PACEMAKER INSERTION  09-24-13   TOTAL HIP ARTHROPLASTY     TUBAL LIGATION      Current Medications: Current Meds  Medication Sig   acetaminophen (TYLENOL) 500 MG tablet Take 1,000 mg by mouth every 6 (six) hours as needed for mild pain.   albuterol (PROVENTIL HFA;VENTOLIN HFA) 108 (90 Base) MCG/ACT inhaler Inhale 2 puffs into the lungs every 6 (six) hours as needed for wheezing or shortness of breath.   apixaban (ELIQUIS) 5 MG TABS tablet Take 1 tablet (5 mg total) by mouth 2 (two) times daily.  atorvastatin (LIPITOR) 20 MG tablet TAKE 1 TABLET BY MOUTH EVERYDAY AT BEDTIME   cyclobenzaprine (FLEXERIL) 10 MG tablet Take 10 mg by mouth 3 (three) times daily as needed for muscle spasms.   empagliflozin (JARDIANCE) 25 MG TABS tablet Take 25 mg by mouth daily.   escitalopram (LEXAPRO) 20 MG tablet Take 20 mg by mouth daily.   fluticasone (FLONASE) 50 MCG/ACT nasal spray Place 1 spray into both nostrils as needed for allergies or rhinitis.   gabapentin (NEURONTIN) 300 MG capsule Take 300 mg by mouth 2 (two) times daily.   glucose blood test strip 1 strip by Misc.(Non-Drug; Combo Route) route 2 times daily.   Insulin Pen Needle 32G X 4 MM MISC Use pen needles as directed for diabetic medications   levothyroxine (SYNTHROID) 200 MCG tablet Take 200 mcg by mouth every morning.   metoprolol tartrate (LOPRESSOR) 50 MG tablet Take 1 tablet (50 mg  total) by mouth 2 (two) times daily.   midodrine (PROAMATINE) 5 MG tablet TAKE 1 TABLET (5 MG TOTAL) BY MOUTH 3 (THREE) TIMES DAILY WITH MEALS.   omega-3 acid ethyl esters (LOVAZA) 1 g capsule Take 2 capsules (2 g total) by mouth 2 (two) times daily.   Semaglutide, 1 MG/DOSE, (OZEMPIC, 1 MG/DOSE,) 4 MG/3ML SOPN Inject 1 mg into the skin once a week. Monday   SYMBICORT 160-4.5 MCG/ACT inhaler Inhale 2 puffs into the lungs 2 (two) times daily as needed for shortness of breath.   TRESIBA FLEXTOUCH 100 UNIT/ML FlexTouch Pen Inject 80 Units into the skin daily.   Vitamin D, Ergocalciferol, (DRISDOL) 1.25 MG (50000 UT) CAPS capsule Take 50,000 Units by mouth every 7 (seven) days. Sunday   [DISCONTINUED] metoprolol tartrate (LOPRESSOR) 100 MG tablet TAKE 0.5 TABLETS (50 MG TOTAL) BY MOUTH 2 (TWO) TIMES DAILY.     Allergies:   Meperidine and Hydrocodone-acetaminophen   Social History   Socioeconomic History   Marital status: Divorced    Spouse name: Not on file   Number of children: 2   Years of education: Not on file   Highest education level: Not on file  Occupational History   Occupation: [employer]  Tobacco Use   Smoking status: Former    Years: 2.00    Types: Cigarettes    Quit date: 11/25/2008    Years since quitting: 12.2    Passive exposure: Past   Smokeless tobacco: Never  Vaping Use   Vaping Use: Never used  Substance and Sexual Activity   Alcohol use: No   Drug use: No   Sexual activity: Not on file  Other Topics Concern   Not on file  Social History Narrative   Not on file   Social Determinants of Health   Financial Resource Strain: Not on file  Food Insecurity: Not on file  Transportation Needs: Not on file  Physical Activity: Not on file  Stress: Not on file  Social Connections: Not on file     Family History: The patient's family history includes Dementia in her mother and sister; Diabetes in her brother and sister; Heart attack in her father. ROS:    Please see the history of present illness.    All other systems reviewed and are negative.  EKGs/Labs/Other Studies Reviewed:    The following studies were reviewed today:  Pacemaker download 01/20/2021 shows a projected battery life of 110 months normal lead and device function.  She is ventricularly paced 0% and atrially paced 31% of the time no breakthrough episodes  of atrial fibrillation  Recent Labs: 07/25/2020: B Natriuretic Peptide 27.2 07/26/2020: ALT 68; Magnesium 1.7 08/11/2020: BUN 14; Creatinine, Ser 1.22; Hemoglobin 14.2; Platelets 246; Potassium 4.4; Sodium 140  Recent Lipid Panel    Component Value Date/Time   CHOL 150 09/07/2018 0919   TRIG 427 (H) 09/07/2018 0919   HDL 31 (L) 09/07/2018 0919   CHOLHDL 4.8 (H) 09/07/2018 0919   LDLCALC Comment 09/07/2018 0919    Physical Exam:    VS:  BP 116/64 (BP Location: Left Arm)    Pulse 76    Ht 5' 3.5" (1.613 m)    Wt 212 lb 3.2 oz (96.3 kg)    LMP 02/26/2005    SpO2 98%    BMI 37.00 kg/m     Wt Readings from Last 3 Encounters:  02/04/21 212 lb 3.2 oz (96.3 kg)  10/20/20 226 lb (102.5 kg)  08/11/20 221 lb 9.6 oz (100.5 kg)     GEN:  Well nourished, well developed in no acute distress HEENT: Normal NECK: No JVD; No carotid bruits LYMPHATICS: No lymphadenopathy CARDIAC: RRR, no murmurs, rubs, gallops RESPIRATORY:  Clear to auscultation without rales, wheezing or rhonchi  ABDOMEN: Soft, non-tender, non-distended MUSCULOSKELETAL:  No edema; No deformity  SKIN: Warm and dry NEUROLOGIC:  Alert and oriented x 3 PSYCHIATRIC:  Normal affect    Signed, Shirlee More, MD  02/04/2021 8:03 AM    [place]

## 2021-02-04 NOTE — Patient Instructions (Signed)
Medication Instructions:  Your physician recommends that you continue on your current medications as directed. Please refer to the Current Medication list given to you today.  *If you need a refill on your cardiac medications before your next appointment, please call your pharmacy*   Lab Work: NONE If you have labs (blood work) drawn today and your tests are completely normal, you will receive your results only by: [place] (if you have MyChart) OR A paper copy in the mail If you have any lab test that is abnormal or we need to change your treatment, we will call you to review the results.   Testing/Procedures: NONE   Follow-Up: At [facility], you and your health needs are our priority.  As part of our continuing mission to provide you with exceptional heart care, we have created designated Provider Care Teams.  These Care Teams include your primary Cardiologist (physician) and Advanced Practice Providers (APPs -  Physician Assistants and Nurse Practitioners) who all work together to provide you with the care you need, when you need it.  We recommend signing up for the patient portal called "MyChart".  Sign up information is provided on this After Visit Summary.  MyChart is used to connect with patients for Virtual Visits (Telemedicine).  Patients are able to view lab/test results, encounter notes, upcoming appointments, etc.  Non-urgent messages can be sent to your provider as well.   To learn more about what you can do with MyChart, go to [email or web address].    Your next appointment:   9 month(s)  The format for your next appointment:   In Person  Provider:   Shirlee More, MD    Other Instructions

## 2021-03-31 ENCOUNTER — Other Ambulatory Visit: Payer: Self-pay | Admitting: Cardiology

## 2021-03-31 NOTE — Telephone Encounter (Signed)
Prescription refill request for Carolyn [name] received. ?Indication: Afib  ?Last office visit:02/04/21 [facility])  ?Scr:0.95 (08/28/20)  ?Age: 70 ?Weight:96.3kg ? ?Appropriate dose and refill sent to requested pharmacy.  ?

## 2021-04-22 ENCOUNTER — Ambulatory Visit (INDEPENDENT_AMBULATORY_CARE_PROVIDER_SITE_OTHER): Payer: PPO

## 2021-04-22 DIAGNOSIS — I495 Sick sinus syndrome: Secondary | ICD-10-CM | POA: Diagnosis not present

## 2021-04-22 LAB — CUP PACEART REMOTE DEVICE CHECK
Battery Impedance: 430 Ohm
Battery Remaining Longevity: 112 mo
Battery Voltage: 2.79 V
Brady Statistic AP VP Percent: 1 %
Brady Statistic AP VS Percent: 24 %
Brady Statistic AS VP Percent: 0 %
Brady Statistic AS VS Percent: 75 %
Date Time Interrogation Session: 20230329075402
Implantable Lead Implant Date: 20150831
Implantable Lead Implant Date: 20150831
Implantable Lead Location: 753859
Implantable Lead Location: 753860
Implantable Lead Model: 4092
Implantable Lead Model: 5076
Implantable Pulse Generator Implant Date: 20150831
Lead Channel Impedance Value: 517 Ohm
Lead Channel Impedance Value: 647 Ohm
Lead Channel Pacing Threshold Amplitude: 0.625 V
Lead Channel Pacing Threshold Amplitude: 1.625 V
Lead Channel Pacing Threshold Pulse Width: 0.4 ms
Lead Channel Pacing Threshold Pulse Width: 0.4 ms
Lead Channel Setting Pacing Amplitude: 1.5 V
Lead Channel Setting Pacing Amplitude: 3.25 V
Lead Channel Setting Pacing Pulse Width: 0.46 ms
Lead Channel Setting Sensing Sensitivity: 2.8 mV

## 2021-05-05 NOTE — Progress Notes (Signed)
Remote pacemaker transmission.   

## 2021-05-28 ENCOUNTER — Other Ambulatory Visit: Payer: Self-pay | Admitting: Cardiology

## 2021-06-02 ENCOUNTER — Telehealth: Payer: Self-pay

## 2021-06-02 MED ORDER — APIXABAN 5 MG PO TABS: 5.0000 mg | ORAL_TABLET | Freq: Two times a day (BID) | ORAL | 3 refills | Status: DC

## 2021-06-02 NOTE — Telephone Encounter (Signed)
Patient completed [place] patient assistance application for Eliquis assistance. I have completed providers portion of application. Once application and written Rx is signed by provider I will fax completed forms.  ?

## 2021-06-09 ENCOUNTER — Telehealth: Payer: Self-pay

## 2021-06-09 ENCOUNTER — Other Ambulatory Visit: Payer: Self-pay | Admitting: Cardiology

## 2021-06-09 NOTE — Telephone Encounter (Signed)
Received confirmation from [place] that patient has been approved for Eliquis free of charge from 06/09/2021 through 01/24/2022.  ?Patient has been notified and copy of letter placed in chart.  ?

## 2021-07-02 ENCOUNTER — Other Ambulatory Visit: Payer: Self-pay | Admitting: Cardiology

## 2021-07-02 NOTE — Telephone Encounter (Signed)
Prescription refill request for Eliquis received. Indication:Afib Last office visit:1/23 Scr:0.9 Age: 71 Weight:96.3 kg  Prescription refilled

## 2021-07-10 ENCOUNTER — Telehealth: Payer: Self-pay

## 2021-07-10 NOTE — Telephone Encounter (Signed)
   [facility] Pre-operative Risk Assessment    Request for surgical clearance:  What type of surgery is being performed? Total Knee replacement, site not specified    When is this surgery scheduled? TBD   What type of clearance is required (medical clearance vs. Pharmacy clearance to hold med vs. Both)? Both  Are there any medications that need to be held prior to surgery and how long?Not specified, left to the discretion of the cardiologist.    Practice name and name of physician performing surgery? Dr. Lara Mulch at [facility]   What is your office phone number: [phone]    7.   What is your office fax number: [phone]  8.   Anesthesia type (None, local, MAC, general) ? Not specified   Carolyn [name] 07/10/2021, 4:26 PM  _________________________________________________________________   (provider comments below)

## 2021-07-13 ENCOUNTER — Telehealth: Payer: Self-pay | Admitting: *Deleted

## 2021-07-13 NOTE — Telephone Encounter (Signed)
S/w the pt and she is agreeable to plan of care for tele pre op appt 07/14/21 @ 11:402 per pt request for time slot. Med rec and consent are done. Pt did tell me she just had an EKG done with Dr. Unk Lightning. I can see EKG in Care EveryWhere. I assured the pt that if the provider needs to see the EKG they can log into the system used for EKG. Pt thanked me for the help.

## 2021-07-13 NOTE — Telephone Encounter (Signed)
Patient with diagnosis of A fib on Eliquis for anticoagulation.    Procedure: Total Knee replacement, site not specified   Date of procedure: TBD   CHA2DS2-VASc Score = 6  his indicates a 9.7% annual risk of stroke. The patient's score is based upon: CHF History: 1 HTN History: 1 Diabetes History: 1 Stroke History: 0 Vascular Disease History: 1 Age Score: 1 Gender Score: 1   CrCl 64 mL/min using adjusted body weight Platelet count 219K   Per office protocol, patient can hold Eliquis for 3 days prior to procedure.

## 2021-07-13 NOTE — Telephone Encounter (Signed)
S/w the pt and she is agreeable to plan of care for tele pre op appt 07/14/21 @ 11:402 per pt request for time slot. Med rec and consent are done. Pt did tell me she just had an EKG done with Dr. Unk Lightning. I can see EKG in Care EveryWhere. I assured the pt that if the provider needs to see the EKG they can log into the system used for EKG. Pt thanked me for the help.      Patient Consent for Virtual Visit        Carolyn [name] has provided verbal consent on 07/13/2021 for a virtual visit (video or telephone).   CONSENT FOR VIRTUAL VISIT FOR:  Carolyn [name]  By participating in this virtual visit I agree to the following:  I hereby voluntarily request, consent and authorize [facility] and its employed or contracted physicians, physician assistants, nurse practitioners or other licensed health care professionals (the Practitioner), to provide me with telemedicine health care services (the "Services") as deemed necessary by the treating Practitioner. I acknowledge and consent to receive the Services by the Practitioner via telemedicine. I understand that the telemedicine visit will involve communicating with the Practitioner through live audiovisual communication technology and the disclosure of certain medical information by electronic transmission. I acknowledge that I have been given the opportunity to request an in-person assessment or other available alternative prior to the telemedicine visit and am voluntarily participating in the telemedicine visit.  I understand that I have the right to withhold or withdraw my consent to the use of telemedicine in the course of my care at any time, without affecting my right to future care or treatment, and that the Practitioner or I may terminate the telemedicine visit at any time. I understand that I have the right to inspect all information obtained and/or recorded in the course of the telemedicine visit and may receive copies of available information  for a reasonable fee.  I understand that some of the potential risks of receiving the Services via telemedicine include:  Delay or interruption in medical evaluation due to technological equipment failure or disruption; Information transmitted may not be sufficient (e.g. poor resolution of images) to allow for appropriate medical decision making by the Practitioner; and/or  In rare instances, security protocols could fail, causing a breach of personal health information.  Furthermore, I acknowledge that it is my responsibility to provide information about my medical history, conditions and care that is complete and accurate to the best of my ability. I acknowledge that Practitioner's advice, recommendations, and/or decision may be based on factors not within their control, such as incomplete or inaccurate data provided by me or distortions of diagnostic images or specimens that may result from electronic transmissions. I understand that the practice of medicine is not an exact science and that Practitioner makes no warranties or guarantees regarding treatment outcomes. I acknowledge that a copy of this consent can be made available to me via my patient portal ([place]), or I can request a printed copy by calling the office of [facility].    I understand that my insurance will be billed for this visit.   I have read or had this consent read to me. I understand the contents of this consent, which adequately explains the benefits and risks of the Services being provided via telemedicine.  I have been provided ample opportunity to ask questions regarding this consent and the Services and have had my questions answered to my  satisfaction. I give my informed consent for the services to be provided through the use of telemedicine in my medical care

## 2021-07-13 NOTE — Telephone Encounter (Signed)
Anticoagulation has been addressed.  Preoperative team, please contact this patient and set up a phone call appointment for further cardiac evaluation.  Thank you for your help.  Jossie Ng. Adhya Cocco NP-C    07/13/2021, [facility] [place] [address] Office [phone] Fax [phone]

## 2021-07-14 ENCOUNTER — Ambulatory Visit (INDEPENDENT_AMBULATORY_CARE_PROVIDER_SITE_OTHER): Payer: PPO | Admitting: Physician Assistant

## 2021-07-14 DIAGNOSIS — Z7901 Long term (current) use of anticoagulants: Secondary | ICD-10-CM | POA: Diagnosis not present

## 2021-07-14 DIAGNOSIS — Z01810 Encounter for preprocedural cardiovascular examination: Secondary | ICD-10-CM

## 2021-07-14 NOTE — Progress Notes (Signed)
Virtual Visit via Telephone Note   Because of Carolyn [name]'s co-morbid illnesses, she is at least at moderate risk for complications without adequate follow up.  This format is felt to be most appropriate for this patient at this time.  The patient did not have access to video technology/had technical difficulties with video requiring transitioning to audio format only (telephone).  All issues noted in this document were discussed and addressed.  No physical exam could be performed with this format.  Please refer to the patient's chart for her consent to telehealth for [facility].  Evaluation Performed:  Preoperative cardiovascular risk assessment _____________   Date:  07/14/2021   Patient ID:  Carolyn [name], DOB [birth year], MRN [id number] Patient Location:  Home Provider location:   Office  Primary Care Provider:  Myrlene Broker, MD Primary Cardiologist:  Shirlee More, MD  Chief Complaint / Patient Profile   71 y.o. y/o female with a h/o PAF, SSS s/p PPM, HTN, HFpEF, CKD 3a, orthostatic hypotension, and chronic anticoagulation who is pending TKA and presents today for telephonic preoperative cardiovascular risk assessment.  Past Medical History    Past Medical History:  Diagnosis Date   Acquired hypothyroidism 07/07/2016   Acute gastritis 07/07/2016   Acute pain of right knee 09/20/2019   Allergic rhinitis 07/07/2016   Anemia 07/07/2016   Anxiety 07/07/2016   Arthritis    Osteoarthritis   ATRIAL FIBRILLATION 11/27/2008   Qualifier: Diagnosis of  By: Genevie Cheshire PharmD, Sally     Atypical chest pain 07/07/2016   Benign essential hypertension 07/07/2016   Overview:  stable on current meds   Benign paroxysmal positional vertigo 07/07/2016   Overview:  Avoid rapid headmovement and position change. May taper the gabapentin to see if helps.   Bilateral low back pain without sciatica 07/07/2016   Overview:  continue gabapentin and meloxicam,   Bimalleolar ankle fracture 02/21/2014    Bronchitis 07/07/2016   Carpal tunnel syndrome    CHF (congestive heart failure) ([place])    08-2013   Chronic anticoagulation 12/03/2014   Chronic diastolic heart failure ([place]) 12/03/2014   CKD (chronic kidney disease) stage 3, GFR 30-59 ml/min (HCC) 11/21/2014   COPD (chronic obstructive pulmonary disease) ([place]) 07/07/2016   DM2 (diabetes mellitus, type 2) (HCC)    Edema, peripheral 07/07/2016   Overview:  increase lasix to 40 mg bid   Enthesopathy 07/07/2016   Esophagitis, reflux 07/07/2016   Essential hypertension 05/06/2015   Functional diarrhea 07/07/2016   GERD (gastroesophageal reflux disease)    Headache 07/07/2016   Heart failure ([place])    History of diverticulosis 07/07/2016   HLD (hyperlipidemia)    HTN (hypertension)    Hyperlipidemia 05/06/2015   Hypertensive heart disease 07/07/2016   Overview:  stable on current meds   Hypertensive heart disease with heart failure ([place]) 12/03/2014   Hypothyroid 07/07/2016   Hypothyroidism    Irritable bowel syndrome 07/07/2016   Overview:  IBS information sheet provided,  dietary changes suggested   Low back pain 07/07/2016   Overview:  lumbar spine films at [facility]   Major depressive disorder, recurrent, moderate ([place]) 07/07/2016   Microalbuminuria due to type 2 diabetes mellitus ([place]) 05/06/2015   Midline low back pain without sciatica 07/07/2016   Overview:  --RTC for warning signs (Fevers, chills, loss of bowel/bladder function, saddle numbness), worsening pain or failure for pain to improve with symptomatic treatment --reviewed natural course of disease that acute LBP takes 4-6 weeks to recover --cont meds  listed for symptomatic relief --follow up in 2-4 weeks or earlier if warning signs develop   Mixed dyslipidemia 03/17/2019   Nerve pain 07/07/2016   Obesity    Obesity (BMI 30-39.9) 05/06/2015   OSA (obstructive sleep apnea) 07/07/2016   Osteoarthritis 07/07/2016   Other ventral hernia 07/07/2016   Overweight 07/07/2016   Pacemaker 11/21/2014   PAF  (paroxysmal atrial fibrillation) (HCC)    Paroxysmal atrial fibrillation ([place]) 11/27/2008   CHADS2 vasc=4    Presence of permanent cardiac pacemaker    09-24-13 01-21-14 Last interrogation in Epic at [facility]   Rhinosinusitis 07/07/2016   Seasonal allergies 07/07/2016   Shortness of breath 04/11/2019   Sleep apnea    No cpap used   Smoker 07/07/2016   SSS (sick sinus syndrome) ([place]) 11/21/2014   Symptomatic menopausal or female climacteric states 07/07/2016   Thoracic back pain 07/07/2016   Type II or unspecified type diabetes mellitus with renal manifestations, uncontrolled 07/07/2016   Past Surgical History:  Procedure Laterality Date   BREAST LUMPECTOMY WITH RADIOACTIVE SEED LOCALIZATION Right 08/02/2017   Procedure: BREAST LUMPECTOMY WITH RADIOACTIVE SEED LOCALIZATION;  Surgeon: Erroll Luna, MD;  Location: [facility];  Service: General;  Laterality: Right;   CARPAL TUNNEL RELEASE     CHOLECYSTECTOMY     HERNIA REPAIR     ORIF ANKLE FRACTURE Left 02/21/2014   Procedure: OPEN REDUCTION INTERNAL FIXATION (ORIF) LEFT  ANKLE FRACTURE;  Surgeon: Tobi Bastos, MD;  Location: [facility];  Service: Orthopedics;  Laterality: Left;   PACEMAKER INSERTION  09-24-13   TOTAL HIP ARTHROPLASTY     TUBAL LIGATION      Allergies  Allergies  Allergen Reactions   Meperidine Nausea And Vomiting and Other (See Comments)    Was given patch behind her ear and was better   Hydrocodone-Acetaminophen Other (See Comments)    REACTION: Patient states she doesnt break in hives or anything. Just feels bad when she takes it.    History of Present Illness    Carolyn [name] is a 71 y.o. female who presents via audio/video conferencing for a telehealth visit today.  Pt was last seen in cardiology clinic on 02/04/21 by Dr. Bettina Gavia.  At that time Carolyn [name] was doing well well.  The patient is now pending procedure as outlined above. Since her last visit, she has done well with no new cardiac symptoms. She  can complete more than 4.0 METS without angina.  She had nonobstructive disease on CT coronary 03/2018 and a low risk nuclear stress test 06/2019.  Home Medications    Prior to Admission medications   Medication Sig Start Date End Date Taking? Authorizing Provider  acetaminophen (TYLENOL) 500 MG tablet Take 1,000 mg by mouth every 6 (six) hours as needed for mild pain.    [provider]  albuterol (PROVENTIL HFA;VENTOLIN HFA) 108 (90 Base) MCG/ACT inhaler Inhale 2 puffs into the lungs every 6 (six) hours as needed for wheezing or shortness of breath.    [provider]  apixaban (ELIQUIS) 5 MG TABS tablet Take 1 tablet (5 mg total) by mouth 2 (two) times daily. 06/02/21   Richardo Priest, MD  atorvastatin (LIPITOR) 20 MG tablet TAKE 1 TABLET BY MOUTH EVERYDAY AT BEDTIME 11/24/20   Richardo Priest, MD  cyclobenzaprine (FLEXERIL) 10 MG tablet Take 10 mg by mouth 3 (three) times daily as needed for muscle spasms. 07/10/20   [provider]  ELIQUIS 5 MG TABS  tablet TAKE 1 TABLET BY MOUTH TWICE A DAY 07/02/21   Richardo Priest, MD  empagliflozin (JARDIANCE) 25 MG TABS tablet Take 25 mg by mouth daily. 11/14/19   [provider]  escitalopram (LEXAPRO) 20 MG tablet Take 20 mg by mouth daily.    [provider]  fluticasone (FLONASE) 50 MCG/ACT nasal spray Place 1 spray into both nostrils as needed for allergies or rhinitis.    [provider]  gabapentin (NEURONTIN) 300 MG capsule Take 300 mg by mouth 2 (two) times daily. 03/19/19   [provider]  glucose blood test strip 1 strip by Misc.(Non-Drug; Combo Route) route 2 times daily. 03/31/17   [provider]  Insulin Pen Needle 32G X 4 MM MISC Use pen needles as directed for diabetic medications 12/19/17   [provider]  levothyroxine (SYNTHROID) 200 MCG tablet Take 200 mcg by mouth every morning. 11/19/20   [provider]  metoprolol tartrate (LOPRESSOR) 50 MG  tablet Take 1 tablet (50 mg total) by mouth 2 (two) times daily. 02/04/21 07/13/21  Richardo Priest, MD  midodrine (PROAMATINE) 5 MG tablet TAKE 1 TABLET (5 MG TOTAL) BY MOUTH 3 (THREE) TIMES DAILY WITH MEALS. 05/28/21   Richardo Priest, MD  omega-3 acid ethyl esters (LOVAZA) 1 g capsule TAKE 2 CAPSULES BY MOUTH 2 TIMES DAILY. 06/09/21   Richardo Priest, MD  Semaglutide, 1 MG/DOSE, (OZEMPIC, 1 MG/DOSE,) 4 MG/3ML SOPN Inject 1 mg into the skin once a week. Monday 09/24/19   [provider]  SYMBICORT 160-4.5 MCG/ACT inhaler Inhale 2 puffs into the lungs 2 (two) times daily as needed for shortness of breath. 07/04/17   [provider]  TRESIBA FLEXTOUCH 100 UNIT/ML FlexTouch Pen Inject 80 Units into the skin daily. 05/21/20   [provider]  Vitamin D, Ergocalciferol, (DRISDOL) 1.25 MG (50000 UT) CAPS capsule Take 50,000 Units by mouth every 7 (seven) days. Sunday 09/19/17   [provider]    Physical Exam    Vital Signs:  Carolyn [name] does not have vital signs available for review today.  Given telephonic nature of communication, physical exam is limited. AAOx3. NAD. Normal affect.  Speech and respirations are unlabored.  Accessory Clinical Findings    None  Assessment & Plan    1.  Preoperative Cardiovascular Risk Assessment:  She has a history of nonobstructive CAD and requires insulin.  She is able to complete 4.0 METS without angina.  She is primarily limited by her knee pain.  Because she does not have a history of ischemia, she has a 0.9% risk of [place] during the perioperative period.    Therefore, based on ACC/AHA guidelines, the patient would be at acceptable risk for the planned procedure without further cardiovascular testing.   The patient was advised that if she develops new symptoms prior to surgery to contact our office to arrange for a follow-up visit, and she verbalized understanding.   2. Anticoagulation hold  Patient with diagnosis of  A fib on Eliquis for anticoagulation.     Procedure: Total Knee replacement, site not specified   Date of procedure: TBD     CHA2DS2-VASc Score = 6  his indicates a 9.7% annual risk of stroke. The patient's score is based upon: CHF History: 1 HTN History: 1 Diabetes History: 1 Stroke History: 0 Vascular Disease History: 1 Age Score: 1 Gender Score: 1   CrCl 64 mL/min using adjusted body weight Platelet count 219K  Per office protocol, patient can hold Eliquis for 3 days prior to procedure.   A copy of this note will be routed to requesting surgeon.  Time:   Today, I have spent 10 minutes with the patient with telehealth technology discussing medical history, symptoms, and management plan.     Tami Lin Keiran Gaffey, PA  07/14/2021, 11:42 AM

## 2021-07-22 ENCOUNTER — Ambulatory Visit (INDEPENDENT_AMBULATORY_CARE_PROVIDER_SITE_OTHER): Payer: PPO

## 2021-07-22 DIAGNOSIS — I495 Sick sinus syndrome: Secondary | ICD-10-CM

## 2021-07-22 LAB — CUP PACEART REMOTE DEVICE CHECK
Battery Impedance: 480 Ohm
Battery Remaining Longevity: 108 mo
Battery Voltage: 2.79 V
Brady Statistic AP VP Percent: 1 %
Brady Statistic AP VS Percent: 21 %
Brady Statistic AS VP Percent: 0 %
Brady Statistic AS VS Percent: 77 %
Date Time Interrogation Session: 20230627092846
Implantable Lead Implant Date: 20150831
Implantable Lead Implant Date: 20150831
Implantable Lead Location: 753859
Implantable Lead Location: 753860
Implantable Lead Model: 4092
Implantable Lead Model: 5076
Implantable Pulse Generator Implant Date: 20150831
Lead Channel Impedance Value: 502 Ohm
Lead Channel Impedance Value: 605 Ohm
Lead Channel Pacing Threshold Amplitude: 0.625 V
Lead Channel Pacing Threshold Amplitude: 1.5 V
Lead Channel Pacing Threshold Pulse Width: 0.4 ms
Lead Channel Pacing Threshold Pulse Width: 0.4 ms
Lead Channel Setting Pacing Amplitude: 1.5 V
Lead Channel Setting Pacing Amplitude: 3 V
Lead Channel Setting Pacing Pulse Width: 0.4 ms
Lead Channel Setting Sensing Sensitivity: 2 mV

## 2021-08-10 ENCOUNTER — Other Ambulatory Visit: Payer: Self-pay | Admitting: Orthopedic Surgery

## 2021-08-10 DIAGNOSIS — G8929 Other chronic pain: Secondary | ICD-10-CM

## 2021-08-11 NOTE — Progress Notes (Signed)
Remote pacemaker transmission.   

## 2021-08-25 NOTE — Progress Notes (Addendum)
COVID Vaccine Completed:  Yes  Date of COVID positive in last 90 days:  No  PCP - Keturah Barre, MD Cardiologist - Norman Herrlich, MD Electrophysiologist - Loman Brooklyn, MD Nephrologist - Barnabas Lister, MD  Cardiac clearance in Epic dated 07-14-21 by Genelle Gather, PA  Medical clearance in [facility] dated 07-10-21 by Keturah Barre, MD  Chest x-ray -  EKG - 07-10-21 On chart Stress Test -  greater than 2 years Epic ECHO - greater than 2 years [facility] Cardiac Cath - greater than 2 years Pacemaker/ICD device last checked: 07-21-21.  Device orders in Epic Spinal Cord Stimulator: Coronary CT - 2020 Epic  Bowel Prep - N/A  Sleep Study - Yes, +sleep apnea CPAP - No  Fasting Blood Sugar - 80 to 120 Checks Blood Sugar - 2 times a day  Blood Thinner Instructions: Eliquis.  Hold x3 days per clearance. Patient is aware and will take last dose on 08-27-21 Aspirin Instructions: Last Dose:  Activity level:   Can go up a flight of stairs and perform activities of daily living without stopping and without symptoms of chest pain or shortness of breath.  Anesthesia review:  Afib, CHF, SSS, Pacemaker, mild CAD, COPD, , OSA, DM,  CKD, HTN  Patient denies shortness of breath, fever, cough and chest pain at PAT appointment  Patient verbalized understanding of instructions that were given to them at the PAT appointment. Patient was also instructed that they will need to review over the PAT instructions again at home before surgery.

## 2021-08-25 NOTE — Patient Instructions (Signed)
SURGICAL WAITING ROOM VISITATION Patients having surgery or a procedure may have no more than 2 support people in the waiting area - these visitors may rotate.   Children under the age of 47 must have an adult with them who is not the patient. If the patient needs to stay at the hospital during part of their recovery, the visitor guidelines for inpatient rooms apply. Pre-op nurse will coordinate an appropriate time for 1 support person to accompany patient in pre-op.  This support person may not rotate.    Please refer to the [facility] website for the visitor guidelines for Inpatients (after your surgery is over and you are in a regular room).      Your procedure is scheduled on: 08-31-21   Report to [facility] Main Entrance    Report to admitting at 7:00 AM   Call this number if you have problems the morning of surgery [phone]   Do not eat food :After Midnight.   After Midnight you may have the following liquids until 6:20  AM/ OF SURGERY  Water Non-Citrus Juices (without pulp, NO RED) Carbonated Beverages Black Coffee (NO MILK/CREAM OR CREAMERS, sugar ok)  Clear Tea (NO MILK/CREAM OR CREAMERS, sugar ok) regular and decaf                             Plain Jell-O (NO RED)                                           Fruit ices (not with fruit pulp, NO RED)                                     Popsicles (NO RED)                                                               Sports drinks like Gatorade (NO RED)                   The day of surgery:  Drink ONE (1) Pre-Surgery G2 at 6:20 AM the morning of surgery. Drink in one sitting. Do not sip.  This drink was given to you during your hospital  pre-op appointment visit. Nothing else to drink after completing the Pre-Surgery G2.          If you have questions, please contact your surgeon's office.   FOLLOW ANY ADDITIONAL PRE OP INSTRUCTIONS YOU RECEIVED FROM YOUR SURGEON'S OFFICE!!!     Oral Hygiene is also  important to reduce your risk of infection.                                    Remember - BRUSH YOUR TEETH THE MORNING OF SURGERY WITH YOUR REGULAR TOOTHPASTE   Do NOT smoke after Midnight  Take these medicines the morning of surgery with A SIP OF WATER: Escitalopram, Gabapentin, Levothyroxine, Metoprolol, Midodrine, Pantoprazole.  Okay to use Tylenol and inhalers  How to  Manage Your Diabetes Before and After Surgery  Why is it important to control my blood sugar before and after surgery? Improving blood sugar levels before and after surgery helps healing and can limit problems. A way of improving blood sugar control is eating a healthy diet by:  Eating less sugar and carbohydrates  Increasing activity/exercise  Talking with your doctor about reaching your blood sugar goals High blood sugars (greater than 180 mg/dL) can raise your risk of infections and slow your recovery, so you will need to focus on controlling your diabetes during the weeks before surgery. Make sure that the doctor who takes care of your diabetes knows about your planned surgery including the date and location.  How do I manage my blood sugar before surgery? Check your blood sugar at least 4 times a day, starting 2 days before surgery, to make sure that the level is not too high or low. Check your blood sugar the morning of your surgery when you wake up and every 2 hours until you get to the Short Stay unit. If your blood sugar is less than 70 mg/dL, you will need to treat for low blood sugar: Do not take insulin. Treat a low blood sugar (less than 70 mg/dL) with  cup of clear juice (cranberry or apple), 4 glucose tablets, OR glucose gel. Recheck blood sugar in 15 minutes after treatment (to make sure it is greater than 70 mg/dL). If your blood sugar is not greater than 70 mg/dL on recheck, call [phone] for further instructions. Report your blood sugar to the short stay nurse when you get to Short Stay.  If you  are admitted to the hospital after surgery: Your blood sugar will be checked by the staff and you will probably be given insulin after surgery (instead of oral diabetes medicines) to make sure you have good blood sugar levels. The goal for blood sugar control after surgery is 80-180 mg/dL.   WHAT DO I DO ABOUT MY DIABETES MEDICATION?  Do not take oral diabetes medicines (pills) the morning of surgery.  THE NIGHT BEFORE SURGERY:  Take 40 units of Tresiba                  Hold Jardiance 72 hours before surgery      Take Ozempic as prescribed.   THE MORNING OF SURGERY: Do not take Jardiance or Ozempic  Reviewed and Endorsed by [facility] Patient Education Committee, August 2015   Bring CPAP mask and tubing day of surgery.                              You may not have any metal on your body including hair pins, jewelry, and body piercing             Do not wear make-up, lotions, powders, perfumes or deodorant  Do not wear nail polish including gel and S&S, artificial/acrylic nails, or any other type of covering on natural nails including finger and toenails. If you have artificial nails, gel coating, etc. that needs to be removed by a nail salon please have this removed prior to surgery or surgery may need to be canceled/ delayed if the surgeon/ anesthesia feels like they are unable to be safely monitored.   Do not shave  48 hours prior to surgery.          Do not bring valuables to the hospital. [place]  FOR VALUABLES.   Contacts, dentures or bridgework may not be worn into surgery.   Bring small overnight bag day of surgery.   DO NOT [place]. PHARMACY WILL DISPENSE MEDICATIONS LISTED ON YOUR MEDICATION LIST TO YOU DURING YOUR ADMISSION [place]!    Special Instructions: Bring a copy of your healthcare power of attorney and living will documents the day of surgery if you haven't scanned them before.  Please read  over the following fact sheets you were given: IF YOU HAVE QUESTIONS ABOUT YOUR PRE-OP INSTRUCTIONS PLEASE CALL [facility] - Preparing for Surgery Before surgery, you can play an important role.  Because skin is not sterile, your skin needs to be as free of germs as possible.  You can reduce the number of germs on your skin by washing with CHG (chlorahexidine gluconate) soap before surgery.  CHG is an antiseptic cleaner which kills germs and bonds with the skin to continue killing germs even after washing. Please DO NOT use if you have an allergy to CHG or antibacterial soaps.  If your skin becomes reddened/irritated stop using the CHG and inform your nurse when you arrive at Short Stay. Do not shave (including legs and underarms) for at least 48 hours prior to the first CHG shower.  You may shave your face/neck.  Please follow these instructions carefully:  1.  Shower with CHG Soap the night before surgery and the  morning of surgery.  2.  If you choose to wash your hair, wash your hair first as usual with your normal  shampoo.  3.  After you shampoo, rinse your hair and body thoroughly to remove the shampoo.                             4.  Use CHG as you would any other liquid soap.  You can apply chg directly to the skin and wash.  Gently with a scrungie or clean washcloth.  5.  Apply the CHG Soap to your body ONLY FROM THE NECK DOWN.   Do   not use on face/ open                           Wound or open sores. Avoid contact with eyes, ears mouth and   genitals (private parts).                       Wash face,  Genitals (private parts) with your normal soap.             6.  Wash thoroughly, paying special attention to the area where your    surgery  will be performed.  7.  Thoroughly rinse your body with warm water from the neck down.  8.  DO NOT shower/wash with your normal soap after using and rinsing off the CHG Soap.                9.  Pat yourself dry with a clean  towel.            10.  Wear clean pajamas.            11.  Place clean sheets on your bed the night of your first shower and do not  sleep with pets. Day of Surgery : Do not apply any lotions/deodorants the morning  of surgery.  Please wear clean clothes to the hospital/surgery center.  FAILURE TO FOLLOW THESE INSTRUCTIONS MAY RESULT IN THE CANCELLATION OF YOUR SURGERY  PATIENT SIGNATURE_________________________________  NURSE SIGNATURE__________________________________  ________________________________________________________________________     Adam Phenix  An incentive spirometer is a tool that can help keep your lungs clear and active. This tool measures how well you are filling your lungs with each breath. Taking long deep breaths may help reverse or decrease the chance of developing breathing (pulmonary) problems (especially infection) following: A long period of time when you are unable to move or be active. BEFORE THE PROCEDURE  If the spirometer includes an indicator to show your best effort, your nurse or respiratory therapist will set it to a desired goal. If possible, sit up straight or lean slightly forward. Try not to slouch. Hold the incentive spirometer in an upright position. INSTRUCTIONS FOR USE  Sit on the edge of your bed if possible, or sit up as far as you can in bed or on a chair. Hold the incentive spirometer in an upright position. Breathe out normally. Place the mouthpiece in your mouth and seal your lips tightly around it. Breathe in slowly and as deeply as possible, raising the piston or the ball toward the top of the column. Hold your breath for 3-5 seconds or for as long as possible. Allow the piston or ball to fall to the bottom of the column. Remove the mouthpiece from your mouth and breathe out normally. Rest for a few seconds and repeat Steps 1 through 7 at least 10 times every 1-2 hours when you are awake. Take your time and take a few normal  breaths between deep breaths. The spirometer may include an indicator to show your best effort. Use the indicator as a goal to work toward during each repetition. After each set of 10 deep breaths, practice coughing to be sure your lungs are clear. If you have an incision (the cut made at the time of surgery), support your incision when coughing by placing a pillow or rolled up towels firmly against it. Once you are able to get out of bed, walk around indoors and cough well. You may stop using the incentive spirometer when instructed by your caregiver.  RISKS AND COMPLICATIONS Take your time so you do not get dizzy or light-headed. If you are in pain, you may need to take or ask for pain medication before doing incentive spirometry. It is harder to take a deep breath if you are having pain. AFTER USE Rest and breathe slowly and easily. It can be helpful to keep track of a log of your progress. Your caregiver can provide you with a simple table to help with this. If you are using the spirometer at home, follow these instructions: [place] IF:  You are having difficultly using the spirometer. You have trouble using the spirometer as often as instructed. Your pain medication is not giving enough relief while using the spirometer. You develop fever of 100.5 F (38.1 C) or higher. SEEK IMMEDIATE MEDICAL CARE IF:  You cough up bloody sputum that had not been present before. You develop fever of 102 F (38.9 C) or greater. You develop worsening pain at or near the incision site. MAKE SURE YOU:  Understand these instructions. Will watch your condition. Will get help right away if you are not doing well or get worse. Document Released: 05/24/2006 Document Revised: 04/05/2011 Document Reviewed: 07/25/2006 [facility] Patient Information 2014 [place], [place].  ________________________________________________________________________  

## 2021-08-26 ENCOUNTER — Encounter (HOSPITAL_COMMUNITY)
Admission: RE | Admit: 2021-08-26 | Discharge: 2021-08-26 | Disposition: A | Discharge status: Home / Self Care | Payer: PPO | Source: Ambulatory Visit | Attending: Orthopedic Surgery | Admitting: Orthopedic Surgery

## 2021-08-26 ENCOUNTER — Other Ambulatory Visit: Payer: Self-pay

## 2021-08-26 ENCOUNTER — Encounter (HOSPITAL_COMMUNITY): Payer: Self-pay

## 2021-08-26 VITALS — BP 111/71 | HR 83 | Temp 98.0°F | Resp 20 | Ht 63.5 in | Wt 205.0 lb

## 2021-08-26 DIAGNOSIS — N183 Chronic kidney disease, stage 3 unspecified: Secondary | ICD-10-CM | POA: Diagnosis not present

## 2021-08-26 DIAGNOSIS — Z01812 Encounter for preprocedural laboratory examination: Secondary | ICD-10-CM | POA: Diagnosis present

## 2021-08-26 DIAGNOSIS — Z01818 Encounter for other preprocedural examination: Secondary | ICD-10-CM

## 2021-08-26 DIAGNOSIS — I130 Hypertensive heart and chronic kidney disease with heart failure and stage 1 through stage 4 chronic kidney disease, or unspecified chronic kidney disease: Secondary | ICD-10-CM | POA: Insufficient documentation

## 2021-08-26 DIAGNOSIS — G8929 Other chronic pain: Secondary | ICD-10-CM | POA: Diagnosis not present

## 2021-08-26 DIAGNOSIS — Z794 Long term (current) use of insulin: Secondary | ICD-10-CM | POA: Diagnosis not present

## 2021-08-26 DIAGNOSIS — M25561 Pain in right knee: Secondary | ICD-10-CM | POA: Insufficient documentation

## 2021-08-26 DIAGNOSIS — M1711 Unilateral primary osteoarthritis, right knee: Secondary | ICD-10-CM | POA: Diagnosis not present

## 2021-08-26 DIAGNOSIS — I480 Paroxysmal atrial fibrillation: Secondary | ICD-10-CM | POA: Insufficient documentation

## 2021-08-26 DIAGNOSIS — Z950 Presence of cardiac pacemaker: Secondary | ICD-10-CM | POA: Diagnosis not present

## 2021-08-26 DIAGNOSIS — J449 Chronic obstructive pulmonary disease, unspecified: Secondary | ICD-10-CM | POA: Insufficient documentation

## 2021-08-26 DIAGNOSIS — I5032 Chronic diastolic (congestive) heart failure: Secondary | ICD-10-CM | POA: Diagnosis not present

## 2021-08-26 DIAGNOSIS — E1122 Type 2 diabetes mellitus with diabetic chronic kidney disease: Secondary | ICD-10-CM | POA: Diagnosis not present

## 2021-08-26 DIAGNOSIS — Z87891 Personal history of nicotine dependence: Secondary | ICD-10-CM | POA: Diagnosis not present

## 2021-08-26 LAB — CBC WITH DIFFERENTIAL/PLATELET
Abs Immature Granulocytes: 0.01 10*3/uL (ref 0.00–0.07)
Basophils Absolute: 0.1 10*3/uL (ref 0.0–0.1)
Basophils Relative: 1 %
Eosinophils Absolute: 0.1 10*3/uL (ref 0.0–0.5)
Eosinophils Relative: 2 %
HCT: 44.4 % (ref 36.0–46.0)
Hemoglobin: 14.7 g/dL (ref 12.0–15.0)
Immature Granulocytes: 0 %
Lymphocytes Relative: 39 %
Lymphs Abs: 2.8 10*3/uL (ref 0.7–4.0)
MCH: 30.6 pg (ref 26.0–34.0)
MCHC: 33.1 g/dL (ref 30.0–36.0)
MCV: 92.5 fL (ref 80.0–100.0)
Monocytes Absolute: 0.4 10*3/uL (ref 0.1–1.0)
Monocytes Relative: 6 %
Neutro Abs: 3.7 10*3/uL (ref 1.7–7.7)
Neutrophils Relative %: 52 %
Platelets: 210 10*3/uL (ref 150–400)
RBC: 4.8 MIL/uL (ref 3.87–5.11)
RDW: 13.1 % (ref 11.5–15.5)
WBC: 7.1 10*3/uL (ref 4.0–10.5)
nRBC: 0 % (ref 0.0–0.2)

## 2021-08-26 LAB — COMPREHENSIVE METABOLIC PANEL
ALT: 38 U/L (ref 0–44)
AST: 29 U/L (ref 15–41)
Albumin: 3.6 g/dL (ref 3.5–5.0)
Alkaline Phosphatase: 75 U/L (ref 38–126)
Anion gap: 4 — ABNORMAL LOW (ref 5–15)
BUN: 11 mg/dL (ref 8–23)
CO2: 22 mmol/L (ref 22–32)
Calcium: 9 mg/dL (ref 8.9–10.3)
Chloride: 114 mmol/L — ABNORMAL HIGH (ref 98–111)
Creatinine, Ser: 0.86 mg/dL (ref 0.44–1.00)
GFR, Estimated: >60 mL/min (ref >60)
Glucose, Bld: 166 mg/dL — ABNORMAL HIGH (ref 70–99)
Potassium: 4.3 mmol/L (ref 3.5–5.1)
Sodium: 140 mmol/L (ref 135–145)
Total Bilirubin: 1.3 mg/dL — ABNORMAL HIGH (ref 0.3–1.2)
Total Protein: 6.7 g/dL (ref 6.5–8.1)

## 2021-08-26 LAB — GLUCOSE, CAPILLARY: Glucose-Capillary: 176 mg/dL — ABNORMAL HIGH (ref 70–99)

## 2021-08-26 LAB — HEMOGLOBIN A1C
Hgb A1c MFr Bld: 5.7 % — ABNORMAL HIGH (ref 4.8–5.6)
Mean Plasma Glucose: 116.89 mg/dL

## 2021-08-26 LAB — SURGICAL PCR SCREEN
MRSA, PCR: NEGATIVE
Staphylococcus aureus: NEGATIVE

## 2021-08-27 ENCOUNTER — Encounter: Payer: Self-pay | Admitting: Cardiology

## 2021-08-27 NOTE — Progress Notes (Signed)
[place] DEVICE PROGRAMMING  Patient Information: Name:  Carolyn [name]  DOB:  1950-05-03  MRN:  [id number]    Planned Procedure:  right total knee  Surgeon:  Dr. Vickey Huger  Date of Procedure:  08/31/2021  Cautery will be used.  Position during surgery:  unknown   Please send documentation back to:  [facility] (Fax # [phone])  Device Information:  Clinic EP Physician:  Allegra Lai, MD   Device Type:  Pacemaker Manufacturer and Phone #:  [employer]tronic: [phone] Pacemaker Dependent?:  No. Date of Last Device Check:  07/21/21 remote Normal Device Function?:  Yes.    Electrophysiologist's Recommendations:  Have magnet available. Provide continuous ECG monitoring when magnet is used or reprogramming is to be performed.  Procedure should not interfere with device function.  No device programming or magnet placement needed.  Per Device Clinic Standing Orders, Willadean Carol, [place]  12:52 PM 08/27/2021

## 2021-08-27 NOTE — Progress Notes (Addendum)
Anesthesia Chart Review  Case: [id number] Date/Time: 08/31/21 0910   Procedure: TOTAL KNEE ARTHROPLASTY (Right: Knee)   Anesthesia type: Spinal   Pre-op diagnosis: Osteoarthritis of right knee M17.11   Location: [facility] ROOM 07 / [facility]   Surgeons: Vickey Huger, MD       DISCUSSION:71 y.o. former smoker with h/o HTN, PAF (on Eliquis), CHF, pacemaker in place (device orders in 08/27/2021 progress note, procedure should not interfere), sleep apnea, COPD, DM II, CKD Stage III, right knee OA scheduled for above procedure 08/31/2021 with Dr. Vickey Huger.   Pt last seen by cardiology 07/14/2021. Per OV note, "She has a history of nonobstructive CAD and requires insulin.  She is able to complete 4.0 METS without angina.  She is primarily limited by her knee pain.  Because she does not have a history of ischemia, she has a 0.9% risk of Mace during the perioperative period.     Therefore, based on ACC/AHA guidelines, the patient would be at acceptable risk for the planned procedure without further cardiovascular testing."  Pt advised to hold Eliquis 3 days prior to procedure.   Cleared by PCP.   Anticipate pt can proceed with planned procedure barring acute status change.   VS: BP 111/71   Pulse 83   Temp 36.7 C (Oral)   Resp 20   Ht 5' 3.5" (1.613 m)   Wt 93 kg   LMP 02/26/2005   SpO2 97%   BMI 35.74 kg/m   PROVIDERS: Myrlene Broker, MD is PCP   Cardiologist - Shirlee More, MD Electrophysiologist - Allegra Lai, MD Nephrologist - Biagio Quint, MD LABS: Labs reviewed: Acceptable for surgery. (all labs ordered are listed, but only abnormal results are displayed)  Labs Reviewed  COMPREHENSIVE METABOLIC PANEL - Abnormal; Notable for the following components:      Result Value   Chloride 114 (*)    Glucose, Bld 166 (*)    Total Bilirubin 1.3 (*)    Anion gap 4 (*)    All other components within normal limits  HEMOGLOBIN A1C - Abnormal; Notable for the following components:   Hgb A1c MFr  Bld 5.7 (*)    All other components within normal limits  GLUCOSE, CAPILLARY - Abnormal; Notable for the following components:   Glucose-Capillary 176 (*)    All other components within normal limits  SURGICAL PCR SCREEN  CBC WITH DIFFERENTIAL/PLATELET     IMAGES:   EKG:   CV: Myocardial Perfusion 07/18/2019 The left ventricular ejection fraction is normal (55-65%). Nuclear stress EF: 65%. There was no ST segment deviation noted during stress. Defect 1: There is a small defect of mild severity present in the mid anterolateral and apical anterior location. This is a low risk study. No evidence of ischemia or MI. Normal EF. Breast attenuation. Past Medical History:  Diagnosis Date   Acquired hypothyroidism 07/07/2016   Acute gastritis 07/07/2016   Acute pain of right knee 09/20/2019   Allergic rhinitis 07/07/2016   Anemia 07/07/2016   Anxiety 07/07/2016   Arthritis    Osteoarthritis   ATRIAL FIBRILLATION 11/27/2008   Qualifier: Diagnosis of  By: Genevie Cheshire PharmD, Sally     Atypical chest pain 07/07/2016   Benign essential hypertension 07/07/2016   Overview:  stable on current meds   Benign paroxysmal positional vertigo 07/07/2016   Overview:  Avoid rapid headmovement and position change. May taper the gabapentin to see if helps.   Bilateral low back pain without sciatica 07/07/2016  Overview:  continue gabapentin and meloxicam,   Bimalleolar ankle fracture 02/21/2014   Bronchitis 07/07/2016   Carpal tunnel syndrome    CHF (congestive heart failure) ([place])    08-2013   Chronic anticoagulation 12/03/2014   Chronic diastolic heart failure ([place]) 12/03/2014   CKD (chronic kidney disease) stage 3, GFR 30-59 ml/min (HCC) 11/21/2014   COPD (chronic obstructive pulmonary disease) ([place]) 07/07/2016   DM2 (diabetes mellitus, type 2) (HCC)    Edema, peripheral 07/07/2016   Overview:  increase lasix to 40 mg bid   Enthesopathy 07/07/2016   Esophagitis, reflux 07/07/2016   Essential hypertension  05/06/2015   Functional diarrhea 07/07/2016   GERD (gastroesophageal reflux disease)    Headache 07/07/2016   Heart failure ([place])    History of diverticulosis 07/07/2016   HLD (hyperlipidemia)    HTN (hypertension)    Hyperlipidemia 05/06/2015   Hypertensive heart disease 07/07/2016   Overview:  stable on current meds   Hypertensive heart disease with heart failure ([place]) 12/03/2014   Hypothyroid 07/07/2016   Hypothyroidism    Irritable bowel syndrome 07/07/2016   Overview:  IBS information sheet provided,  dietary changes suggested   Low back pain 07/07/2016   Overview:  lumbar spine films at [facility]   Major depressive disorder, recurrent, moderate ([place]) 07/07/2016   Microalbuminuria due to type 2 diabetes mellitus ([place]) 05/06/2015   Midline low back pain without sciatica 07/07/2016   Overview:  --RTC for warning signs (Fevers, chills, loss of bowel/bladder function, saddle numbness), worsening pain or failure for pain to improve with symptomatic treatment --reviewed natural course of disease that acute LBP takes 4-6 weeks to recover --cont meds listed for symptomatic relief --follow up in 2-4 weeks or earlier if warning signs develop   Mixed dyslipidemia 03/17/2019   Nerve pain 07/07/2016   Obesity    Obesity (BMI 30-39.9) 05/06/2015   OSA (obstructive sleep apnea) 07/07/2016   Osteoarthritis 07/07/2016   Other ventral hernia 07/07/2016   Overweight 07/07/2016   Pacemaker 11/21/2014   PAF (paroxysmal atrial fibrillation) (HCC)    Paroxysmal atrial fibrillation ([place]) 11/27/2008   CHADS2 vasc=4    Presence of permanent cardiac pacemaker    09-24-13 01-21-14 Last interrogation in Epic at [facility]   Rhinosinusitis 07/07/2016   Seasonal allergies 07/07/2016   Shortness of breath 04/11/2019   Sleep apnea    No cpap used   Smoker 07/07/2016   SSS (sick sinus syndrome) ([place]) 11/21/2014   Symptomatic menopausal or female climacteric states 07/07/2016   Thoracic back pain 07/07/2016   Type II or  unspecified type diabetes mellitus with renal manifestations, uncontrolled 07/07/2016    Past Surgical History:  Procedure Laterality Date   BREAST LUMPECTOMY WITH RADIOACTIVE SEED LOCALIZATION Right 08/02/2017   Procedure: BREAST LUMPECTOMY WITH RADIOACTIVE SEED LOCALIZATION;  Surgeon: Erroll Luna, MD;  Location: [facility];  Service: General;  Laterality: Right;   CARPAL TUNNEL RELEASE     CATARACT EXTRACTION W/ INTRAOCULAR LENS IMPLANT Bilateral    CHOLECYSTECTOMY     HERNIA REPAIR     ORIF ANKLE FRACTURE Left 02/21/2014   Procedure: OPEN REDUCTION INTERNAL FIXATION (ORIF) LEFT  ANKLE FRACTURE;  Surgeon: Tobi Bastos, MD;  Location[facility];  Service: Orthopedics;  Laterality: Left;   PACEMAKER INSERTION  09/24/2013   TOTAL HIP ARTHROPLASTY Left    Bursa cut   TUBAL LIGATION      MEDICATIONS:  acetaminophen (TYLENOL) 500 MG tablet   albuterol (PROVENTIL HFA;VENTOLIN HFA) 108 (90  Base) MCG/ACT inhaler   apixaban (ELIQUIS) 5 MG TABS tablet   atorvastatin (LIPITOR) 20 MG tablet   ELIQUIS 5 MG TABS tablet   empagliflozin (JARDIANCE) 25 MG TABS tablet   escitalopram (LEXAPRO) 20 MG tablet   gabapentin (NEURONTIN) 300 MG capsule   glucose blood test strip   Insulin Pen Needle 32G X 4 MM MISC   levothyroxine (SYNTHROID) 175 MCG tablet   metoprolol tartrate (LOPRESSOR) 50 MG tablet   midodrine (PROAMATINE) 5 MG tablet   omega-3 acid ethyl esters (LOVAZA) 1 g capsule   pantoprazole (PROTONIX) 40 MG tablet   Semaglutide, 1 MG/DOSE, (OZEMPIC, 1 MG/DOSE,) 4 MG/3ML SOPN   SYMBICORT 160-4.5 MCG/ACT inhaler   TRESIBA FLEXTOUCH 100 UNIT/ML FlexTouch Pen   Vitamin D, Ergocalciferol, (DRISDOL) 1.25 MG (50000 UT) CAPS capsule   No current facility-administered medications for this encounter.    Carolyn Felix Ward, PA-C [facility] [phone]

## 2021-08-30 NOTE — Anesthesia Preprocedure Evaluation (Addendum)
Anesthesia Evaluation  Patient identified by MRN, date of birth, ID band Patient awake    Reviewed: Allergy & Precautions, NPO status , Patient's Chart, lab work & pertinent test results  Airway Mallampati: II  TM Distance: >3 FB Neck ROM: Full    Dental no notable dental hx. (+) Teeth Intact, Dental Advisory Given   Pulmonary COPD, former smoker,    Pulmonary exam normal breath sounds clear to auscultation       Cardiovascular hypertension, Pt. on home beta blockers and Pt. on medications + CAD and +CHF  Normal cardiovascular exam+ pacemaker (for SSS)  Rhythm:Regular Rate:Normal     Neuro/Psych Anxiety    GI/Hepatic GERD  ,  Endo/Other  diabetes, Type 2Hypothyroidism   Renal/GU Lab Results      Component                Value               Date                      CREATININE               0.86                08/26/2021               K                        4.3                 08/26/2021                    Musculoskeletal  (+) Arthritis ,   Abdominal   Peds  Hematology Lab Results      Component                Value               Date                           HGB                      14.7                08/26/2021                HCT                      44.4                08/26/2021                    PLT                      210                 08/26/2021              Anesthesia Other Findings   Reproductive/Obstetrics                            Anesthesia Physical Anesthesia Plan  ASA: 3  Anesthesia Plan: Spinal and Regional   Post-op Pain Management: Minimal or no pain anticipated and Regional block*   Induction:  PONV Risk Score and Plan: 3 and Treatment may vary due to age or medical condition, Midazolam and Ondansetron  Airway Management Planned: Nasal Cannula and Natural Airway  Additional Equipment: None  Intra-op Plan:   Post-operative Plan:   Informed  Consent: I have reviewed the patients History and Physical, chart, labs and discussed the procedure including the risks, benefits and alternatives for the proposed anesthesia with the patient or authorized representative who has indicated his/her understanding and acceptance.     Dental advisory given  Plan Discussed with:   Anesthesia Plan Comments: (Spinal w R adductor  On Eliquis check last dose   On Jardiance daily)       Anesthesia Quick Evaluation

## 2021-08-31 ENCOUNTER — Ambulatory Visit (HOSPITAL_COMMUNITY): Payer: PPO | Admitting: Physician Assistant

## 2021-08-31 ENCOUNTER — Other Ambulatory Visit: Payer: Self-pay

## 2021-08-31 ENCOUNTER — Encounter (HOSPITAL_COMMUNITY): Payer: Self-pay | Admitting: Orthopedic Surgery

## 2021-08-31 ENCOUNTER — Ambulatory Visit (HOSPITAL_BASED_OUTPATIENT_CLINIC_OR_DEPARTMENT_OTHER): Payer: PPO | Admitting: Anesthesiology

## 2021-08-31 ENCOUNTER — Encounter (HOSPITAL_COMMUNITY)
Admission: RE | Disposition: A | Discharge status: Home / Self Care | Payer: Self-pay | Source: Home / Self Care | Attending: Orthopedic Surgery

## 2021-08-31 ENCOUNTER — Observation Stay (HOSPITAL_COMMUNITY)
Admission: RE | Admit: 2021-08-31 | Discharge: 2021-09-01 | Disposition: A | Discharge status: Home / Self Care | Payer: PPO | Attending: Orthopedic Surgery | Admitting: Orthopedic Surgery

## 2021-08-31 DIAGNOSIS — Z96642 Presence of left artificial hip joint: Secondary | ICD-10-CM | POA: Diagnosis not present

## 2021-08-31 DIAGNOSIS — I5032 Chronic diastolic (congestive) heart failure: Secondary | ICD-10-CM

## 2021-08-31 DIAGNOSIS — I130 Hypertensive heart and chronic kidney disease with heart failure and stage 1 through stage 4 chronic kidney disease, or unspecified chronic kidney disease: Secondary | ICD-10-CM | POA: Insufficient documentation

## 2021-08-31 DIAGNOSIS — I480 Paroxysmal atrial fibrillation: Secondary | ICD-10-CM | POA: Diagnosis not present

## 2021-08-31 DIAGNOSIS — Z950 Presence of cardiac pacemaker: Secondary | ICD-10-CM | POA: Diagnosis not present

## 2021-08-31 DIAGNOSIS — Z7901 Long term (current) use of anticoagulants: Secondary | ICD-10-CM | POA: Diagnosis not present

## 2021-08-31 DIAGNOSIS — N183 Chronic kidney disease, stage 3 unspecified: Secondary | ICD-10-CM | POA: Diagnosis not present

## 2021-08-31 DIAGNOSIS — Z96659 Presence of unspecified artificial knee joint: Secondary | ICD-10-CM

## 2021-08-31 DIAGNOSIS — M1711 Unilateral primary osteoarthritis, right knee: Principal | ICD-10-CM | POA: Insufficient documentation

## 2021-08-31 DIAGNOSIS — Z79899 Other long term (current) drug therapy: Secondary | ICD-10-CM | POA: Insufficient documentation

## 2021-08-31 DIAGNOSIS — E1122 Type 2 diabetes mellitus with diabetic chronic kidney disease: Secondary | ICD-10-CM | POA: Insufficient documentation

## 2021-08-31 DIAGNOSIS — I2510 Atherosclerotic heart disease of native coronary artery without angina pectoris: Secondary | ICD-10-CM | POA: Diagnosis not present

## 2021-08-31 DIAGNOSIS — J449 Chronic obstructive pulmonary disease, unspecified: Secondary | ICD-10-CM | POA: Diagnosis not present

## 2021-08-31 DIAGNOSIS — Z7985 Long-term (current) use of injectable non-insulin antidiabetic drugs: Secondary | ICD-10-CM | POA: Diagnosis not present

## 2021-08-31 DIAGNOSIS — Z794 Long term (current) use of insulin: Secondary | ICD-10-CM | POA: Insufficient documentation

## 2021-08-31 DIAGNOSIS — Z7984 Long term (current) use of oral hypoglycemic drugs: Secondary | ICD-10-CM | POA: Diagnosis not present

## 2021-08-31 DIAGNOSIS — Z87891 Personal history of nicotine dependence: Secondary | ICD-10-CM | POA: Diagnosis not present

## 2021-08-31 DIAGNOSIS — I110 Hypertensive heart disease with heart failure: Secondary | ICD-10-CM

## 2021-08-31 DIAGNOSIS — E039 Hypothyroidism, unspecified: Secondary | ICD-10-CM | POA: Diagnosis not present

## 2021-08-31 DIAGNOSIS — E119 Type 2 diabetes mellitus without complications: Secondary | ICD-10-CM

## 2021-08-31 HISTORY — PX: TOTAL KNEE ARTHROPLASTY: SHX125

## 2021-08-31 HISTORY — DX: Presence of unspecified artificial knee joint: Z96.659

## 2021-08-31 LAB — GLUCOSE, CAPILLARY
Glucose-Capillary: 100 mg/dL — ABNORMAL HIGH (ref 70–99)
Glucose-Capillary: 100 mg/dL — ABNORMAL HIGH (ref 70–99)
Glucose-Capillary: 93 mg/dL (ref 70–99)
Glucose-Capillary: 200 mg/dL — ABNORMAL HIGH (ref 70–99)
Glucose-Capillary: 234 mg/dL — ABNORMAL HIGH (ref 70–99)

## 2021-08-31 SURGERY — ARTHROPLASTY, KNEE, TOTAL
Anesthesia: Regional | Site: Knee | Laterality: Right

## 2021-08-31 MED ORDER — MIDAZOLAM HCL 2 MG/2ML IJ SOLN: 1.0000 mg | INTRAMUSCULAR | Status: DC

## 2021-08-31 MED ORDER — POVIDONE-IODINE 10 % EX SWAB
2.0000 | Freq: Once | CUTANEOUS | Status: AC
  Administered 2021-08-31: 2 via TOPICAL

## 2021-08-31 MED ORDER — EMPAGLIFLOZIN 25 MG PO TABS
25.0000 mg | ORAL_TABLET | Freq: Every day | ORAL | Status: DC
  Administered 2021-09-01: 25 mg via ORAL
  Filled 2021-08-31: qty 1

## 2021-08-31 MED ORDER — ONDANSETRON HCL 4 MG/2ML IJ SOLN
INTRAMUSCULAR | Status: DC | PRN
  Administered 2021-08-31: 4 mg via INTRAVENOUS

## 2021-08-31 MED ORDER — BUPIVACAINE-EPINEPHRINE 0.25% -1:200000 IJ SOLN
INTRAMUSCULAR | Status: DC | PRN
  Administered 2021-08-31: 30 mL

## 2021-08-31 MED ORDER — FERROUS SULFATE 325 (65 FE) MG PO TABS
325.0000 mg | ORAL_TABLET | Freq: Three times a day (TID) | ORAL | Status: DC
  Administered 2021-09-01 (×2): 325 mg via ORAL
  Filled 2021-08-31 (×2): qty 1

## 2021-08-31 MED ORDER — CEFAZOLIN SODIUM-DEXTROSE 2-4 GM/100ML-% IV SOLN
2.0000 g | INTRAVENOUS | Status: AC
  Administered 2021-08-31: 2 g via INTRAVENOUS
  Filled 2021-08-31: qty 100

## 2021-08-31 MED ORDER — DEXAMETHASONE SODIUM PHOSPHATE 10 MG/ML IJ SOLN: 8.0000 mg | Freq: Once | INTRAMUSCULAR | Status: DC

## 2021-08-31 MED ORDER — TRANEXAMIC ACID-NACL 1000-0.7 MG/100ML-% IV SOLN
1000.0000 mg | INTRAVENOUS | Status: AC
  Administered 2021-08-31: 1000 mg via INTRAVENOUS
  Filled 2021-08-31: qty 100

## 2021-08-31 MED ORDER — ORAL CARE MOUTH RINSE
15.0000 mL | Freq: Once | OROMUCOSAL | Status: AC
Start: 2021-08-31 — End: 2021-08-31

## 2021-08-31 MED ORDER — BUPIVACAINE-EPINEPHRINE (PF) 0.25% -1:200000 IJ SOLN
INTRAMUSCULAR | Status: AC
  Filled 2021-08-31: qty 30

## 2021-08-31 MED ORDER — ALBUTEROL SULFATE (2.5 MG/3ML) 0.083% IN NEBU: 2.5000 mg | INHALATION_SOLUTION | Freq: Four times a day (QID) | RESPIRATORY_TRACT | Status: DC | PRN

## 2021-08-31 MED ORDER — PROPOFOL 500 MG/50ML IV EMUL
INTRAVENOUS | Status: DC | PRN
  Administered 2021-08-31: 25 ug/kg/min via INTRAVENOUS

## 2021-08-31 MED ORDER — DEXAMETHASONE SODIUM PHOSPHATE 4 MG/ML IJ SOLN
INTRAMUSCULAR | Status: DC | PRN
  Administered 2021-08-31: 8 mg via INTRAVENOUS

## 2021-08-31 MED ORDER — MOMETASONE FURO-FORMOTEROL FUM 200-5 MCG/ACT IN AERO: 2.0000 | INHALATION_SPRAY | Freq: Two times a day (BID) | RESPIRATORY_TRACT | Status: DC

## 2021-08-31 MED ORDER — DOCUSATE SODIUM 100 MG PO CAPS
100.0000 mg | ORAL_CAPSULE | Freq: Two times a day (BID) | ORAL | Status: DC
  Administered 2021-08-31 – 2021-09-01 (×2): 100 mg via ORAL
  Filled 2021-08-31 (×2): qty 1

## 2021-08-31 MED ORDER — BISACODYL 5 MG PO TBEC: 5.0000 mg | DELAYED_RELEASE_TABLET | Freq: Every day | ORAL | Status: DC | PRN

## 2021-08-31 MED ORDER — FENTANYL CITRATE PF 50 MCG/ML IJ SOSY
50.0000 ug | PREFILLED_SYRINGE | Freq: Once | INTRAMUSCULAR | Status: AC
  Administered 2021-08-31: 50 ug via INTRAVENOUS
  Filled 2021-08-31: qty 2

## 2021-08-31 MED ORDER — GABAPENTIN 300 MG PO CAPS
300.0000 mg | ORAL_CAPSULE | Freq: Two times a day (BID) | ORAL | Status: DC
  Administered 2021-08-31 – 2021-09-01 (×2): 300 mg via ORAL
  Filled 2021-08-31 (×2): qty 1

## 2021-08-31 MED ORDER — ACETAMINOPHEN 500 MG PO TABS
1000.0000 mg | ORAL_TABLET | Freq: Four times a day (QID) | ORAL | Status: AC
  Administered 2021-08-31 – 2021-09-01 (×3): 1000 mg via ORAL
  Filled 2021-08-31 (×4): qty 2

## 2021-08-31 MED ORDER — PHENOL 1.4 % MT LIQD: 1.0000 | OROMUCOSAL | Status: DC | PRN

## 2021-08-31 MED ORDER — LACTATED RINGERS IV SOLN: INTRAVENOUS | Status: DC

## 2021-08-31 MED ORDER — BUPIVACAINE LIPOSOME 1.3 % IJ SUSP: 20.0000 mL | Freq: Once | INTRAMUSCULAR | Status: DC

## 2021-08-31 MED ORDER — FENTANYL CITRATE (PF) 100 MCG/2ML IJ SOLN
INTRAMUSCULAR | Status: DC | PRN
  Administered 2021-08-31 (×2): 50 ug via INTRAVENOUS

## 2021-08-31 MED ORDER — METOPROLOL TARTRATE 50 MG PO TABS
50.0000 mg | ORAL_TABLET | Freq: Two times a day (BID) | ORAL | Status: DC
  Administered 2021-08-31 – 2021-09-01 (×2): 50 mg via ORAL
  Filled 2021-08-31 (×2): qty 1

## 2021-08-31 MED ORDER — ACETAMINOPHEN 10 MG/ML IV SOLN: 1000.0000 mg | Freq: Once | INTRAVENOUS | Status: DC | PRN

## 2021-08-31 MED ORDER — BUPIVACAINE LIPOSOME 1.3 % IJ SUSP
INTRAMUSCULAR | Status: AC
  Filled 2021-08-31: qty 20

## 2021-08-31 MED ORDER — ROPIVACAINE HCL 5 MG/ML IJ SOLN
INTRAMUSCULAR | Status: DC | PRN
  Administered 2021-08-31: 30 mL via PERINEURAL

## 2021-08-31 MED ORDER — BUPIVACAINE LIPOSOME 1.3 % IJ SUSP
INTRAMUSCULAR | Status: DC | PRN
  Administered 2021-08-31: 20 mL

## 2021-08-31 MED ORDER — MENTHOL 3 MG MT LOZG: 1.0000 | LOZENGE | OROMUCOSAL | Status: DC | PRN

## 2021-08-31 MED ORDER — LEVOTHYROXINE SODIUM 50 MCG PO TABS
175.0000 ug | ORAL_TABLET | Freq: Every day | ORAL | Status: DC
  Administered 2021-09-01: 175 ug via ORAL
  Filled 2021-08-31: qty 1

## 2021-08-31 MED ORDER — METHOCARBAMOL 500 MG IVPB - SIMPLE MED: 500.0000 mg | Freq: Four times a day (QID) | INTRAVENOUS | Status: DC | PRN

## 2021-08-31 MED ORDER — SODIUM CHLORIDE (PF) 0.9 % IJ SOLN
INTRAMUSCULAR | Status: AC
  Filled 2021-08-31: qty 20

## 2021-08-31 MED ORDER — ZOLPIDEM TARTRATE 5 MG PO TABS
5.0000 mg | ORAL_TABLET | Freq: Every evening | ORAL | Status: DC | PRN
  Filled 2021-08-31: qty 1

## 2021-08-31 MED ORDER — MIDODRINE HCL 5 MG PO TABS
5.0000 mg | ORAL_TABLET | Freq: Three times a day (TID) | ORAL | Status: DC
  Administered 2021-08-31 – 2021-09-01 (×3): 5 mg via ORAL
  Filled 2021-08-31 (×5): qty 1

## 2021-08-31 MED ORDER — CLONIDINE HCL (ANALGESIA) 100 MCG/ML EP SOLN
EPIDURAL | Status: DC | PRN
  Administered 2021-08-31: 100 ug

## 2021-08-31 MED ORDER — DIPHENHYDRAMINE HCL 12.5 MG/5ML PO ELIX
12.5000 mg | ORAL_SOLUTION | ORAL | Status: DC | PRN
  Administered 2021-08-31: 25 mg via ORAL
  Filled 2021-08-31: qty 10

## 2021-08-31 MED ORDER — FLEET ENEMA 7-19 GM/118ML RE ENEM: 1.0000 | ENEMA | Freq: Once | RECTAL | Status: DC | PRN

## 2021-08-31 MED ORDER — CHLORHEXIDINE GLUCONATE 0.12 % MT SOLN
15.0000 mL | Freq: Once | OROMUCOSAL | Status: AC
  Administered 2021-08-31: 15 mL via OROMUCOSAL

## 2021-08-31 MED ORDER — OXYCODONE HCL 5 MG PO TABS
5.0000 mg | ORAL_TABLET | ORAL | Status: DC | PRN
  Administered 2021-08-31 – 2021-09-01 (×5): 10 mg via ORAL
  Filled 2021-08-31 (×5): qty 2

## 2021-08-31 MED ORDER — FENTANYL CITRATE PF 50 MCG/ML IJ SOSY: 25.0000 ug | PREFILLED_SYRINGE | INTRAMUSCULAR | Status: DC | PRN

## 2021-08-31 MED ORDER — PANTOPRAZOLE SODIUM 40 MG PO TBEC: 40.0000 mg | DELAYED_RELEASE_TABLET | Freq: Every day | ORAL | Status: DC

## 2021-08-31 MED ORDER — SODIUM CHLORIDE 0.9 % IR SOLN
Status: DC | PRN
  Administered 2021-08-31: 1000 mL

## 2021-08-31 MED ORDER — FENTANYL CITRATE (PF) 100 MCG/2ML IJ SOLN
INTRAMUSCULAR | Status: AC
  Filled 2021-08-31: qty 2

## 2021-08-31 MED ORDER — ALUM & MAG HYDROXIDE-SIMETH 200-200-20 MG/5ML PO SUSP: 30.0000 mL | ORAL | Status: DC | PRN

## 2021-08-31 MED ORDER — APIXABAN 5 MG PO TABS
5.0000 mg | ORAL_TABLET | Freq: Two times a day (BID) | ORAL | Status: DC
  Administered 2021-09-01: 5 mg via ORAL
  Filled 2021-08-31: qty 1

## 2021-08-31 MED ORDER — SENNOSIDES-DOCUSATE SODIUM 8.6-50 MG PO TABS: 1.0000 | ORAL_TABLET | Freq: Every evening | ORAL | Status: DC | PRN

## 2021-08-31 MED ORDER — SODIUM CHLORIDE 0.9 % IV SOLN: INTRAVENOUS | Status: DC

## 2021-08-31 MED ORDER — SODIUM CHLORIDE 0.9% FLUSH
INTRAVENOUS | Status: DC | PRN
  Administered 2021-08-31: 20 mL via INTRAVENOUS

## 2021-08-31 MED ORDER — ONDANSETRON HCL 4 MG/2ML IJ SOLN: 4.0000 mg | Freq: Once | INTRAMUSCULAR | Status: DC | PRN

## 2021-08-31 MED ORDER — ONDANSETRON HCL 4 MG PO TABS: 4.0000 mg | ORAL_TABLET | Freq: Four times a day (QID) | ORAL | Status: DC | PRN

## 2021-08-31 MED ORDER — PHENYLEPHRINE HCL (PRESSORS) 10 MG/ML IV SOLN
INTRAVENOUS | Status: DC | PRN
  Administered 2021-08-31: 80 ug via INTRAVENOUS

## 2021-08-31 MED ORDER — FENTANYL CITRATE PF 50 MCG/ML IJ SOSY: 50.0000 ug | PREFILLED_SYRINGE | INTRAMUSCULAR | Status: DC

## 2021-08-31 MED ORDER — METOCLOPRAMIDE HCL 5 MG/ML IJ SOLN: 5.0000 mg | Freq: Three times a day (TID) | INTRAMUSCULAR | Status: DC | PRN

## 2021-08-31 MED ORDER — HYDROMORPHONE HCL 1 MG/ML IJ SOLN
0.5000 mg | INTRAMUSCULAR | Status: DC | PRN
  Administered 2021-08-31 – 2021-09-01 (×2): 1 mg via INTRAVENOUS
  Filled 2021-08-31 (×2): qty 1

## 2021-08-31 MED ORDER — PANTOPRAZOLE SODIUM 40 MG PO TBEC
40.0000 mg | DELAYED_RELEASE_TABLET | Freq: Every morning | ORAL | Status: DC
  Administered 2021-09-01: 40 mg via ORAL
  Filled 2021-08-31: qty 1

## 2021-08-31 MED ORDER — ACETAMINOPHEN 500 MG PO TABS
1000.0000 mg | ORAL_TABLET | Freq: Once | ORAL | Status: AC
  Administered 2021-08-31: 1000 mg via ORAL
  Filled 2021-08-31: qty 2

## 2021-08-31 MED ORDER — GABAPENTIN 300 MG PO CAPS
300.0000 mg | ORAL_CAPSULE | Freq: Once | ORAL | Status: DC
  Filled 2021-08-31: qty 1

## 2021-08-31 MED ORDER — MIDAZOLAM HCL 2 MG/2ML IJ SOLN
1.0000 mg | Freq: Once | INTRAMUSCULAR | Status: AC
  Administered 2021-08-31: 1 mg via INTRAVENOUS
  Filled 2021-08-31: qty 2

## 2021-08-31 MED ORDER — ATORVASTATIN CALCIUM 20 MG PO TABS
20.0000 mg | ORAL_TABLET | Freq: Every day | ORAL | Status: DC
  Administered 2021-08-31: 20 mg via ORAL
  Filled 2021-08-31: qty 1

## 2021-08-31 MED ORDER — METOCLOPRAMIDE HCL 5 MG PO TABS: 5.0000 mg | ORAL_TABLET | Freq: Three times a day (TID) | ORAL | Status: DC | PRN

## 2021-08-31 MED ORDER — DEXAMETHASONE SODIUM PHOSPHATE 10 MG/ML IJ SOLN
10.0000 mg | Freq: Once | INTRAMUSCULAR | Status: AC
  Administered 2021-09-01: 10 mg via INTRAVENOUS
  Filled 2021-08-31: qty 1

## 2021-08-31 MED ORDER — BUPIVACAINE HCL (PF) 0.75 % IJ SOLN
INTRAMUSCULAR | Status: DC | PRN
  Administered 2021-08-31: 1.6 mL via INTRATHECAL

## 2021-08-31 MED ORDER — ONDANSETRON HCL 4 MG/2ML IJ SOLN: 4.0000 mg | Freq: Four times a day (QID) | INTRAMUSCULAR | Status: DC | PRN

## 2021-08-31 MED ORDER — METHOCARBAMOL 500 MG PO TABS
500.0000 mg | ORAL_TABLET | Freq: Four times a day (QID) | ORAL | Status: DC | PRN
  Administered 2021-08-31 – 2021-09-01 (×3): 500 mg via ORAL
  Filled 2021-08-31 (×3): qty 1

## 2021-08-31 MED ORDER — ESCITALOPRAM OXALATE 20 MG PO TABS
20.0000 mg | ORAL_TABLET | ORAL | Status: DC
  Administered 2021-09-01: 20 mg via ORAL
  Filled 2021-08-31: qty 1

## 2021-08-31 MED ORDER — WATER FOR IRRIGATION, STERILE IR SOLN
Status: DC | PRN
  Administered 2021-08-31: 2000 mL

## 2021-08-31 SURGICAL SUPPLY — 62 items
ARTISURF 10M PLY R 6-9CD KNEE (Knees) ×1 IMPLANT
BAG COUNTER SPONGE SURGICOUNT (BAG) IMPLANT
BAG SPEC THK2 15X12 ZIP CLS (MISCELLANEOUS) ×1
BAG SPNG CNTER NS LX DISP (BAG)
BAG ZIPLOCK 12X15 (MISCELLANEOUS) ×3 IMPLANT
BLADE SAGITTAL 13X1.27X60 (BLADE) ×3 IMPLANT
BLADE SAW SGTL 18X1.27X75 (BLADE) ×3 IMPLANT
BLADE SURG 15 STRL LF DISP TIS (BLADE) ×2 IMPLANT
BLADE SURG 15 STRL SS (BLADE) ×2
BLADE SURG SZ10 CARB STEEL (BLADE) ×6 IMPLANT
BNDG ELASTIC 6X5.8 VLCR STR LF (GAUZE/BANDAGES/DRESSINGS) ×3 IMPLANT
BOWL SMART MIX CTS (DISPOSABLE) ×3 IMPLANT
BSPLAT TIB 5D D CMNT STM RT (Knees) ×1 IMPLANT
CEMENT BONE R 1X40 (Cement) ×6 IMPLANT
CLSR STERI-STRIP ANTIMIC 1/2X4 (GAUZE/BANDAGES/DRESSINGS) ×1 IMPLANT
COMP FEM PERSONA SZ7 RT (Joint) ×2 IMPLANT
COMPONENT FEM PERSONA SZ7 RT (Joint) IMPLANT
COVER SURGICAL LIGHT HANDLE (MISCELLANEOUS) ×3 IMPLANT
CUFF TOURN SGL QUICK 34 (TOURNIQUET CUFF) ×2
CUFF TRNQT CYL 34X4.125X (TOURNIQUET CUFF) ×2 IMPLANT
DRAPE INCISE IOBAN 66X45 STRL (DRAPES) ×6 IMPLANT
DRAPE U-SHAPE 47X51 STRL (DRAPES) ×3 IMPLANT
DRSG AQUACEL AG ADV 3.5X10 (GAUZE/BANDAGES/DRESSINGS) ×3 IMPLANT
DURAPREP 26ML APPLICATOR (WOUND CARE) ×6 IMPLANT
ELECT REM PT RETURN 15FT ADLT (MISCELLANEOUS) ×3 IMPLANT
GLOVE BIOGEL PI IND STRL 7.5 (GLOVE) ×2 IMPLANT
GLOVE BIOGEL PI IND STRL 8.5 (GLOVE) ×4 IMPLANT
GLOVE BIOGEL PI INDICATOR 7.5 (GLOVE) ×1
GLOVE BIOGEL PI INDICATOR 8.5 (GLOVE) ×2
GLOVE SURG LX 7.5 STRW (GLOVE) ×1
GLOVE SURG LX 8.0 MICRO (GLOVE) ×2
GLOVE SURG LX STRL 7.5 STRW (GLOVE) ×2 IMPLANT
GLOVE SURG LX STRL 8.0 MICRO (GLOVE) ×4 IMPLANT
GLOVE SURG ORTHO 8.0 STRL STRW (GLOVE) ×6 IMPLANT
GOWN STRL REUS W/ TWL XL LVL3 (GOWN DISPOSABLE) ×4 IMPLANT
GOWN STRL REUS W/TWL XL LVL3 (GOWN DISPOSABLE) ×4
HANDPIECE INTERPULSE COAX TIP (DISPOSABLE) ×2
HOLDER FOLEY CATH W/STRAP (MISCELLANEOUS) ×3 IMPLANT
HOOD PEEL AWAY FLYTE STAYCOOL (MISCELLANEOUS) ×9 IMPLANT
KIT TURNOVER KIT A (KITS) IMPLANT
MANIFOLD NEPTUNE II (INSTRUMENTS) ×3 IMPLANT
NDL HYPO 21X1.5 SAFETY (NEEDLE) ×2 IMPLANT
NEEDLE HYPO 21X1.5 SAFETY (NEEDLE) ×2 IMPLANT
NS IRRIG 1000ML POUR BTL (IV SOLUTION) ×3 IMPLANT
PACK TOTAL KNEE CUSTOM (KITS) ×3 IMPLANT
PROTECTOR NERVE ULNAR (MISCELLANEOUS) ×3 IMPLANT
SET HNDPC FAN SPRY TIP SCT (DISPOSABLE) ×2 IMPLANT
SPIKE FLUID TRANSFER (MISCELLANEOUS) ×6 IMPLANT
STEM POLY PAT PLY 35M KNEE (Knees) ×1 IMPLANT
STEM TIBIA 5 DEG SZ D R KNEE (Knees) IMPLANT
STRIP CLOSURE SKIN 1/2X4 (GAUZE/BANDAGES/DRESSINGS) ×3 IMPLANT
SUT BONE WAX W31G (SUTURE) ×3 IMPLANT
SUT MNCRL AB 3-0 PS2 18 (SUTURE) ×3 IMPLANT
SUT STRATAFIX 0 PDS 27 VIOLET (SUTURE) ×2
SUT STRATAFIX 1PDS 45CM VIOLET (SUTURE) ×3 IMPLANT
SUT VIC AB 1 CT1 36 (SUTURE) ×3 IMPLANT
SUTURE STRATFX 0 PDS 27 VIOLET (SUTURE) ×2 IMPLANT
SYR 30ML LL (SYRINGE) ×6 IMPLANT
TIBIA STEM 5 DEG SZ D R KNEE (Knees) ×2 IMPLANT
TRAY FOLEY MTR SLVR 16FR STAT (SET/KITS/TRAYS/PACK) ×3 IMPLANT
WATER STERILE IRR 1000ML POUR (IV SOLUTION) ×6 IMPLANT
WRAP KNEE MAXI GEL POST OP (GAUZE/BANDAGES/DRESSINGS) ×3 IMPLANT

## 2021-08-31 NOTE — Anesthesia Postprocedure Evaluation (Signed)
Anesthesia Post Note  Patient: Carolyn [name]  Procedure(s) Performed: TOTAL KNEE ARTHROPLASTY (Right: Knee)     Patient location during evaluation: Nursing Unit Anesthesia Type: Regional and Spinal Level of consciousness: oriented and awake and alert Pain management: pain level controlled Vital Signs Assessment: post-procedure vital signs reviewed and stable Respiratory status: spontaneous breathing and respiratory function stable Cardiovascular status: blood pressure returned to baseline and stable Postop Assessment: no headache, no backache, no apparent nausea or vomiting and patient able to bend at knees Anesthetic complications: no   No notable events documented.  Last Vitals:  Vitals:   08/31/21 1200 08/31/21 1226  BP: 131/67 119/81  Pulse: 64 71  Resp: 16 20  Temp: 36.6 C 36.5 C  SpO2: 99% 97%    Last Pain:  Vitals:   08/31/21 1200  TempSrc:   PainSc: 0-No pain                 Barnet Glasgow

## 2021-08-31 NOTE — Progress Notes (Signed)
Orthopedic Tech Progress Note Patient Details:  Carolyn [name] 08-30-1950 [id number]  Patient ID: [name], female   DOB: 10/06/1950, 71 y.o.   MRN: [id number]  [name] 08/31/2021, 11:33 AM Cpm applied to right leg in pacu. Bone foam given to patient in pacu

## 2021-08-31 NOTE — Anesthesia Procedure Notes (Signed)
Anesthesia Regional Block: Adductor canal block   Pre-Anesthetic Checklist: , timeout performed,  Correct Patient, Correct Site, Correct Laterality,  Correct Procedure, Correct Position, site marked,  Risks and benefits discussed,  Surgical consent,  Pre-op evaluation,  At surgeon's request and post-op pain management  Laterality: Lower and Right  Prep: chloraprep       Needles:  Injection technique: Single-shot  Needle Type: Echogenic Needle     Needle Length: 9cm  Needle Gauge: 22     Additional Needles:   Procedures:,,,, ultrasound used (permanent image in chart),,    Narrative:  Start time: 08/31/2021 8:29 AM End time: 08/31/2021 8:34 AM Injection made incrementally with aspirations every 5 mL.  Performed by: Personally  Anesthesiologist: Barnet Glasgow, MD  Additional Notes: Block assessed prior to surgery. Pt tolerated procedure well.

## 2021-08-31 NOTE — Transfer of Care (Signed)
Immediate Anesthesia Transfer of Care Note  Patient: Carolyn [name]  Procedure(s) Performed: TOTAL KNEE ARTHROPLASTY (Right: Knee)  Patient Location: PACU  Anesthesia Type:Spinal  Level of Consciousness: sedated, patient cooperative and responds to stimulation  Airway & Oxygen Therapy: Patient Spontanous Breathing and Patient connected to face mask oxygen  Post-op Assessment: Report given to RN and Post -op Vital signs reviewed and stable  Post vital signs: Reviewed and stable  Last Vitals:  Vitals Value Taken Time  BP 102/58 08/31/21 1053  Temp    Pulse 71 08/31/21 1055  Resp 16 08/31/21 1055  SpO2 98 % 08/31/21 1055  Vitals shown include unvalidated device data.  Last Pain:  Vitals:   08/31/21 0845  TempSrc:   PainSc: 0-No pain         Complications: No notable events documented.

## 2021-08-31 NOTE — Anesthesia Procedure Notes (Signed)
Spinal  Start time: 08/31/2021 9:37 AM End time: 08/31/2021 8:31 AM Reason for block: surgical anesthesia Staffing Anesthesiologist: Barnet Glasgow, MD Performed by: Gean Maidens, CRNA Authorized by: Gean Maidens, CRNA   Preanesthetic Checklist Completed: patient identified, IV checked, site marked, risks and benefits discussed, surgical consent, monitors and equipment checked, pre-op evaluation and timeout performed Spinal Block Patient position: sitting Prep: DuraPrep Patient monitoring: heart rate, continuous pulse ox and blood pressure Approach: midline Location: L4-5 Injection technique: single-shot Needle Needle type: Pencan  Needle gauge: 24 G Needle insertion depth: 7 cm

## 2021-08-31 NOTE — Op Note (Signed)
TOTAL KNEE REPLACEMENT OPERATIVE NOTE:  08/31/2021  11:07 AM  PATIENT:  Carolyn [name]  71 y.o. female  PRE-OPERATIVE DIAGNOSIS:  Osteoarthritis of right knee M17.11  POST-OPERATIVE DIAGNOSIS:  Osteoarthritis of right knee M17.11  PROCEDURE:  Procedure(s): TOTAL KNEE ARTHROPLASTY  SURGEON:  Surgeon(s): Vickey Huger, MD  PHYSICIAN ASSISTANT: Carlyon Shadow, PA-C   ANESTHESIA:   spinal  SPECIMEN: None  COUNTS:  Correct  TOURNIQUET:   Total Tourniquet Time Documented: Thigh (Right) - 36 minutes Total: Thigh (Right) - 36 minutes   DICTATION:  Indication for procedure:    The patient is a 71 y.o. female who has failed conservative treatment for Osteoarthritis of right knee M17.11.  Informed consent was obtained prior to anesthesia. The risks versus benefits of the operation were explain and in a way the patient can, and did, understand.     Description of procedure:     The patient was taken to the operating room and placed under anesthesia.  The patient was positioned in the usual fashion taking care that all body parts were adequately padded and/or protected.  A tourniquet was applied and the leg prepped and draped in the usual sterile fashion.  The extremity was exsanguinated with the esmarch and tourniquet inflated to 300 mmHg.  Pre-operative range of motion was normal.    A midline incision approximately 6-7 inches long was made with a #10 blade.  A new blade was used to make a parapatellar arthrotomy going 2-3 cm into the quadriceps tendon, over the patella, and alongside the medial aspect of the patellar tendon.  A synovectomy was then performed with the #10 blade and forceps. I then elevated the deep MCL off the medial tibial metaphysis subperiosteally around to the semimembranosus attachment.    I everted the patella and used calipers to measure patellar thickness.  I used the reamer to ream down to appropriate thickness to recreate the native thickness.  I then  removed excess bone with the rongeur and sagittal saw.  I used the appropriately sized template and drilled the three lug holes.  I then put the trial in place and measured the thickness with the calipers to ensure recreation of the native thickness.  The trial was then removed and the patella subluxed and the knee brought into flexion.  A homan retractor was place to retract and protect the patella and lateral structures.  A Z-retractor was place medially to protect the medial structures.  The extra-medullary alignment system was used to make cut the tibial articular surface perpendicular to the anamotic axis of the tibia and in 3 degrees of posterior slope.  The cut surface and alignment jig was removed.   I then marked out the epicondylar axis on the distal femur.   I then used the anterior referencing sizer and measured the femur to be a size 7.  The 4-In-1 cutting block was screwed into place in external rotation matching the posterior condylar angle, making our cuts perpendicular to the epicondylar axis.  Anterior, posterior and chamfer cuts were made with the sagittal saw.  The cutting block and cut pieces were removed.  A lamina spreader was placed in 90 degrees of flexion.  The ACL, PCL, menisci, and posterior condylar osteophytes were removed.  A 10 mm spacer blocked was found to offer good flexion and extension gap balance after minimal in degree releasing.   The scoop retractor was then placed and the femoral finishing block was pinned in place.  The small sagittal saw  was used as well as the lug drill to finish the femur.  The block and cut surfaces were removed and the medullary canal hole filled with autograft bone from the cut pieces.  The tibia was delivered forward in deep flexion and external rotation.  A size D tray was selected and pinned into place centered on the medial 1/3 of the tibial tubercle.  The reamer and keel was used to prepare the tibia through the tray.    I then trialed  with the size 7 femur, size D tibia, a 10 mm insert and the 35 patella.  I had excellent flexion/extension gap balance, excellent patella tracking.  Flexion was full and beyond 120 degrees; extension was zero.  These components were chosen and the staff opened them to me on the back table while the knee was lavaged copiously and the cement mixed.  The soft tissue was infiltrated with 60cc of exparel 1.3% through a 21 gauge needle.  I cemented in the components and removed all excess cement.  The polyethylene tibial component was snapped into place and the knee placed in extension while cement was hardening.  The capsule was infilltrated with a 60cc exparel/marcaine/saline mixture.   Once the cement was hard, the tourniquet was let down.  Hemostasis was obtained.  The arthrotomy was closed using a #1 stratofix running suture.  The deep soft tissues were closed with #0 vicryls and the subcuticular layer closed with #2-0 vicryl.  The skin was reapproximated and closed with 3.0 Monocryl.  The wound was covered with steristrips, aquacel dressing, and a TED stocking.   The patient was then awakened, extubated, and taken to the recovery room in stable condition.  BLOOD LOSS:  161WR COMPLICATIONS:  None.  PLAN OF CARE: Admit for overnight observation  PATIENT DISPOSITION:  PACU - hemodynamically stable.   Delay start of Pharmacological VTE agent (>24hrs) due to surgical blood loss or risk of bleeding:  yes  Please fax a copy of this op note to my office at [phone] (please only include page 1 and 2 of the Case Information op note)

## 2021-08-31 NOTE — Anesthesia Procedure Notes (Signed)
Spinal  Staffing Performed by: Gean Maidens, CRNA Authorized by: Gean Maidens, CRNA

## 2021-08-31 NOTE — H&P (Signed)
Carolyn [name] MRN:  [id number] DOB/SEX:  1950-12-11/female  CHIEF COMPLAINT:  Painful right Knee  HISTORY: Patient is a 71 y.o. female presented with a history of pain in the right knee. Onset of symptoms was gradual starting a few years ago with gradually worsening course since that time. Patient has been treated conservatively with over-the-counter NSAIDs and activity modification. Patient currently rates pain in the knee at 10 out of 10 with activity. There is pain at night.  PAST MEDICAL HISTORY: Patient Active Problem List   Diagnosis Date Noted   AKI (acute kidney injury) ([place]) 07/25/2020   Heart failure (HCC)    HTN (hypertension)    HLD (hyperlipidemia)    Hypothyroidism    Sleep apnea    Presence of permanent cardiac pacemaker    PAF (paroxysmal atrial fibrillation) (HCC)    Obesity    DM2 (diabetes mellitus, type 2) (HCC)    CHF (congestive heart failure) (HCC)    Arthritis    Acute pain of right knee 09/20/2019   Shortness of breath 04/11/2019   Encounter for annual physical examination excluding gynecological examination in a patient older than 17 years 03/17/2019   Mixed dyslipidemia 03/17/2019   Great toe pain, left 05/25/2018   Mild CAD 04/06/2018   Diarrhea due to malabsorption 08/17/2017   Abnormal mammogram 05/10/2017   Acquired hypothyroidism 07/07/2016   Acute gastritis 07/07/2016   Allergic rhinitis 07/07/2016   Anemia 07/07/2016   Anxiety 07/07/2016   Atypical chest pain 07/07/2016   Hypertensive heart disease 07/07/2016   Benign paroxysmal positional vertigo 07/07/2016   Bilateral low back pain without sciatica 07/07/2016   Bronchitis 07/07/2016   Carpal tunnel syndrome 07/07/2016   COPD (chronic obstructive pulmonary disease) ([place]) 07/07/2016   Edema, peripheral 07/07/2016   Enthesopathy 07/07/2016   Esophagitis, reflux 07/07/2016   Functional diarrhea 07/07/2016   GERD (gastroesophageal reflux disease) 07/07/2016   Headache 07/07/2016    History of diverticulosis 07/07/2016   Hypothyroid 07/07/2016   Irritable bowel syndrome 07/07/2016   Low back pain 07/07/2016   Major depressive disorder, recurrent, moderate (HCC) 07/07/2016   Midline low back pain without sciatica 07/07/2016   Nerve pain 07/07/2016   OSA (obstructive sleep apnea) 07/07/2016   Osteoarthritis 07/07/2016   Other ventral hernia 07/07/2016   Overweight 07/07/2016   Rhinosinusitis 07/07/2016   Seasonal allergies 07/07/2016   Smoker 07/07/2016   Symptomatic menopausal or female climacteric states 07/07/2016   Thoracic back pain 07/07/2016   Type II or unspecified type diabetes mellitus with renal manifestations, uncontrolled 07/07/2016   Benign essential hypertension 07/07/2016   Essential hypertension 05/06/2015   Hyperlipidemia 05/06/2015   Microalbuminuria due to type 2 diabetes mellitus ([place]) 05/06/2015   Obesity (BMI 30-39.9) 05/06/2015   Chronic anticoagulation 12/03/2014   Chronic diastolic heart failure ([place]) 12/03/2014   Hypertensive heart disease with heart failure ([place]) 12/03/2014   CKD (chronic kidney disease) stage 3, GFR 30-59 ml/min ([place]) 11/21/2014   Pacemaker 11/21/2014   SSS (sick sinus syndrome) ([place]) 11/21/2014   Bimalleolar ankle fracture 02/21/2014   Paroxysmal atrial fibrillation ([place]) 11/27/2008   ATRIAL FIBRILLATION 11/27/2008   Past Medical History:  Diagnosis Date   Acquired hypothyroidism 07/07/2016   Acute gastritis 07/07/2016   Acute pain of right knee 09/20/2019   Allergic rhinitis 07/07/2016   Anemia 07/07/2016   Anxiety 07/07/2016   Arthritis    Osteoarthritis   ATRIAL FIBRILLATION 11/27/2008   Qualifier: Diagnosis of  By: Genevie Cheshire PharmD, Gay Filler  Atypical chest pain 07/07/2016   Benign essential hypertension 07/07/2016   Overview:  stable on current meds   Benign paroxysmal positional vertigo 07/07/2016   Overview:  Avoid rapid headmovement and position change. May taper the gabapentin to see if helps.   Bilateral  low back pain without sciatica 07/07/2016   Overview:  continue gabapentin and meloxicam,   Bimalleolar ankle fracture 02/21/2014   Bronchitis 07/07/2016   Carpal tunnel syndrome    CHF (congestive heart failure) ([place])    08-2013   Chronic anticoagulation 12/03/2014   Chronic diastolic heart failure ([place]) 12/03/2014   CKD (chronic kidney disease) stage 3, GFR 30-59 ml/min (HCC) 11/21/2014   COPD (chronic obstructive pulmonary disease) ([place]) 07/07/2016   DM2 (diabetes mellitus, type 2) (HCC)    Edema, peripheral 07/07/2016   Overview:  increase lasix to 40 mg bid   Enthesopathy 07/07/2016   Esophagitis, reflux 07/07/2016   Essential hypertension 05/06/2015   Functional diarrhea 07/07/2016   GERD (gastroesophageal reflux disease)    Headache 07/07/2016   Heart failure ([place])    History of diverticulosis 07/07/2016   HLD (hyperlipidemia)    HTN (hypertension)    Hyperlipidemia 05/06/2015   Hypertensive heart disease 07/07/2016   Overview:  stable on current meds   Hypertensive heart disease with heart failure ([place]) 12/03/2014   Hypothyroid 07/07/2016   Hypothyroidism    Irritable bowel syndrome 07/07/2016   Overview:  IBS information sheet provided,  dietary changes suggested   Low back pain 07/07/2016   Overview:  lumbar spine films at [facility]   Major depressive disorder, recurrent, moderate ([place]) 07/07/2016   Microalbuminuria due to type 2 diabetes mellitus ([place]) 05/06/2015   Midline low back pain without sciatica 07/07/2016   Overview:  --RTC for warning signs (Fevers, chills, loss of bowel/bladder function, saddle numbness), worsening pain or failure for pain to improve with symptomatic treatment --reviewed natural course of disease that acute LBP takes 4-6 weeks to recover --cont meds listed for symptomatic relief --follow up in 2-4 weeks or earlier if warning signs develop   Mixed dyslipidemia 03/17/2019   Nerve pain 07/07/2016   Obesity    Obesity (BMI 30-39.9) 05/06/2015   OSA (obstructive sleep  apnea) 07/07/2016   Osteoarthritis 07/07/2016   Other ventral hernia 07/07/2016   Overweight 07/07/2016   Pacemaker 11/21/2014   PAF (paroxysmal atrial fibrillation) (HCC)    Paroxysmal atrial fibrillation ([place]) 11/27/2008   CHADS2 vasc=4    Presence of permanent cardiac pacemaker    09-24-13 01-21-14 Last interrogation in Epic at [facility]   Rhinosinusitis 07/07/2016   Seasonal allergies 07/07/2016   Shortness of breath 04/11/2019   Sleep apnea    No cpap used   Smoker 07/07/2016   SSS (sick sinus syndrome) ([place]) 11/21/2014   Symptomatic menopausal or female climacteric states 07/07/2016   Thoracic back pain 07/07/2016   Type II or unspecified type diabetes mellitus with renal manifestations, uncontrolled 07/07/2016   Past Surgical History:  Procedure Laterality Date   BREAST LUMPECTOMY WITH RADIOACTIVE SEED LOCALIZATION Right 08/02/2017   Procedure: BREAST LUMPECTOMY WITH RADIOACTIVE SEED LOCALIZATION;  Surgeon: Erroll Luna, MD;  Location: [facility];  Service: General;  Laterality: Right;   CARPAL TUNNEL RELEASE     CATARACT EXTRACTION W/ INTRAOCULAR LENS IMPLANT Bilateral    CHOLECYSTECTOMY     HERNIA REPAIR     ORIF ANKLE FRACTURE Left 02/21/2014   Procedure: OPEN REDUCTION INTERNAL FIXATION (ORIF) LEFT  ANKLE FRACTURE;  Surgeon: Kipp Brood  Gioffre, MD;  Location: [facility];  Service: Orthopedics;  Laterality: Left;   PACEMAKER INSERTION  09/24/2013   TOTAL HIP ARTHROPLASTY Left    Bursa cut   TUBAL LIGATION       MEDICATIONS:   Medications Prior to Admission  Medication Sig Dispense Refill Last Dose   acetaminophen (TYLENOL) 500 MG tablet Take 1,000 mg by mouth every 6 (six) hours as needed for mild pain.      albuterol (PROVENTIL HFA;VENTOLIN HFA) 108 (90 Base) MCG/ACT inhaler Inhale 2 puffs into the lungs every 6 (six) hours as needed for wheezing or shortness of breath.      apixaban (ELIQUIS) 5 MG TABS tablet Take 1 tablet (5 mg total) by mouth 2 (two) times daily. 180 tablet  3    atorvastatin (LIPITOR) 20 MG tablet TAKE 1 TABLET BY MOUTH EVERYDAY AT BEDTIME 90 tablet 2    empagliflozin (JARDIANCE) 25 MG TABS tablet Take 25 mg by mouth daily.      escitalopram (LEXAPRO) 20 MG tablet Take 20 mg by mouth every other day.      gabapentin (NEURONTIN) 300 MG capsule Take 300 mg by mouth 2 (two) times daily.      levothyroxine (SYNTHROID) 175 MCG tablet Take 175 mcg by mouth daily before breakfast.      metoprolol tartrate (LOPRESSOR) 50 MG tablet Take 1 tablet (50 mg total) by mouth 2 (two) times daily. 180 tablet 3    midodrine (PROAMATINE) 5 MG tablet TAKE 1 TABLET (5 MG TOTAL) BY MOUTH 3 (THREE) TIMES DAILY WITH MEALS. 270 tablet 2    omega-3 acid ethyl esters (LOVAZA) 1 g capsule TAKE 2 CAPSULES BY MOUTH 2 TIMES DAILY. 360 capsule 1    pantoprazole (PROTONIX) 40 MG tablet Take 40 mg by mouth every morning.      Semaglutide, 1 MG/DOSE, (OZEMPIC, 1 MG/DOSE,) 4 MG/3ML SOPN Inject 2 mg into the skin every Monday.      SYMBICORT 160-4.5 MCG/ACT inhaler Inhale 2 puffs into the lungs 2 (two) times daily as needed for shortness of breath.  1    TRESIBA FLEXTOUCH 100 UNIT/ML FlexTouch Pen Inject 80 Units into the skin at bedtime.      Vitamin D, Ergocalciferol, (DRISDOL) 1.25 MG (50000 UT) CAPS capsule Take 50,000 Units by mouth every Sunday.      ELIQUIS 5 MG TABS tablet TAKE 1 TABLET BY MOUTH TWICE A DAY (Patient not taking: Reported on 08/25/2021) 180 tablet 1 Not Taking   glucose blood test strip 1 strip by Misc.(Non-Drug; Combo Route) route 2 times daily.      Insulin Pen Needle 32G X 4 MM MISC Use pen needles as directed for diabetic medications       ALLERGIES:   Allergies  Allergen Reactions   Meperidine Nausea And Vomiting and Other (See Comments)    Was given patch behind her ear and was better   Hydrocodone-Acetaminophen Other (See Comments)    REACTION: Patient states she doesnt break in hives or anything. Just feels bad when she takes it.    REVIEW OF  SYSTEMS:  A comprehensive review of systems was negative except for: Musculoskeletal: positive for arthralgias and bone pain   FAMILY HISTORY:   Family History  Problem Relation Age of Onset   Dementia Mother    Heart attack Father    Diabetes Sister    Dementia Sister    Diabetes Brother     SOCIAL HISTORY:   Social History  Tobacco Use   Smoking status: Former    Years: 2.00    Types: Cigarettes    Quit date: 11/25/2008    Years since quitting: 12.7    Passive exposure: Past   Smokeless tobacco: Never  Substance Use Topics   Alcohol use: No     EXAMINATION:  Vital signs in last 24 hours:    LMP 02/26/2005   General Appearance:    Alert, cooperative, no distress, appears stated age  Head:    Normocephalic, without obvious abnormality, atraumatic  Eyes:    PERRL, conjunctiva/corneas clear, EOM's intact, fundi    benign, both eyes  Ears:    Normal TM's and external ear canals, both ears  Nose:   Nares normal, septum midline, mucosa normal, no drainage    or sinus tenderness  Throat:   Lips, mucosa, and tongue normal; teeth and gums normal  Neck:   Supple, symmetrical, trachea midline, no adenopathy;    thyroid:  no enlargement/tenderness/nodules; no carotid   bruit or JVD  Back:     Symmetric, no curvature, ROM normal, no CVA tenderness  Lungs:     Clear to auscultation bilaterally, respirations unlabored  Chest Wall:    No tenderness or deformity   Heart:    Regular rate and rhythm, S1 and S2 normal, no murmur, rub   or gallop  Breast Exam:    No tenderness, masses, or nipple abnormality  Abdomen:     Soft, non-tender, bowel sounds active all four quadrants,    no masses, no organomegaly  Genitalia:    Normal female without lesion, discharge or tenderness  Rectal:    Normal tone, no masses or tenderness;   guaiac negative stool  Extremities:   Extremities normal, atraumatic, no cyanosis or edema  Pulses:   2+ and symmetric all extremities  Skin:   Skin color,  texture, turgor normal, no rashes or lesions  Lymph nodes:   Cervical, supraclavicular, and axillary nodes normal  Neurologic:   CNII-XII intact, normal strength, sensation and reflexes    throughout    Musculoskeletal:  ROM 0-120, Ligaments intact,  Imaging Review Plain radiographs demonstrate severe degenerative joint disease of the right knee. The overall alignment is neutral. The bone quality appears to be good for age and reported activity level.  Assessment/Plan: Primary osteoarthritis, right knee   The patient history, physical examination and imaging studies are consistent with advanced degenerative joint disease of the right knee. The patient has failed conservative treatment.  The clearance notes were reviewed.  After discussion with the patient it was felt that Total Knee Replacement was indicated. The procedure,  risks, and benefits of total knee arthroplasty were presented and reviewed. The risks including but not limited to aseptic loosening, infection, blood clots, vascular injury, stiffness, patella tracking problems complications among others were discussed. The patient acknowledged the explanation, agreed to proceed with the plan.  Preoperative templating of the joint replacement has been completed, documented, and submitted to the Operating Room personnel in order to optimize intra-operative equipment management.    Patient's anticipated LOS is less than 2 midnights, meeting these requirements: - Lives within 1 hour of care - Has a competent adult at home to recover with post-op recover - NO history of  - Chronic pain requiring opiods  - Diabetes  - Coronary Artery Disease  - Heart failure  - Heart attack  - Stroke  - DVT/VTE  - Cardiac arrhythmia  - Respiratory Failure/COPD  - Renal failure  -  Anemia  - Advanced Liver disease     Donia Ast 08/31/2021, 7:00 AM

## 2021-08-31 NOTE — Evaluation (Signed)
Physical Therapy Evaluation Patient Details Name: Carolyn [name] MRN: [id number] DOB: [birth year] Today's Date: 08/31/2021  History of Present Illness  71 yo female , s/P RTKA 08/31/21. PMH: L ankle ORIF, PAF, DM, HTN  Clinical Impression   The patient is eager to ambulate. Patient ambulated x 40 ' using RW. Patient reports pain mild and felt better after ambulating . Patient  has family and friend support at DC. Pt admitted with above diagnosis.  Pt currently with functional limitations due to the deficits listed below (see PT Problem List). Pt will benefit from skilled PT to increase their independence and safety with mobility to allow discharge to the venue listed below.        Recommendations for follow up therapy are one component of a multi-disciplinary discharge planning process, led by the attending physician.  Recommendations may be updated based on patient status, additional functional criteria and insurance authorization.  Follow Up Recommendations Follow physician's recommendations for discharge plan and follow up therapies      Assistance Recommended at Discharge Set up Supervision/Assistance  Patient can return home with the following  A little help with bathing/dressing/bathroom;Assistance with cooking/housework;Assist for transportation    Equipment Recommendations None recommended by PT  Recommendations for Other Services       Functional Status Assessment Patient has had a recent decline in their functional status and demonstrates the ability to make significant improvements in function in a reasonable and predictable amount of time.     Precautions / Restrictions Precautions Precautions: Fall;Knee Restrictions Weight Bearing Restrictions: No      Mobility  Bed Mobility Overal bed mobility: Needs Assistance Bed Mobility: Supine to Sit, Sit to Supine     Supine to sit: Min assist Sit to supine: Min assist   General bed mobility comments: support right   leg    Transfers Overall transfer level: Needs assistance Equipment used: Rolling walker (2 wheels) Transfers: Sit to/from Stand Sit to Stand: Min guard           General transfer comment: cues for hand placement    Ambulation/Gait Ambulation/Gait assistance: Min assist Gait Distance (Feet): 40 Feet Assistive device: Rolling walker (2 wheels) Gait Pattern/deviations: Step-through pattern, Step-to pattern, Antalgic Gait velocity: decr     General Gait Details: cues for sequence  Stairs            Wheelchair Mobility    Modified Rankin (Stroke Patients Only)       Balance Overall balance assessment: No apparent balance deficits (not formally assessed)                                           Pertinent Vitals/Pain Pain Assessment Pain Assessment: 0-10 Pain Score: 5  Pain Location: right knee Pain Descriptors / Indicators: Discomfort, Aching Pain Intervention(s): Monitored during session, Premedicated before session, Repositioned    Home Living Family/patient expects to be discharged to:: Private residence Living Arrangements: Alone Available Help at Discharge: Family;Friend(s);Available 24 hours/day Type of Home: House Home Access: Stairs to enter   [employer] of Steps: 1   Home Layout: One level Home Equipment: Conservation officer, nature (2 wheels)      Prior Function Prior Level of Function : Independent/Modified Independent                     Hand Dominance   Dominant Hand: Right  Extremity/Trunk Assessment   Upper Extremity Assessment Upper Extremity Assessment: Overall WFL for tasks assessed    Lower Extremity Assessment Lower Extremity Assessment: RLE deficits/detail RLE Deficits / Details: Knee flexion 70* seated. + SLR    Cervical / Trunk Assessment Cervical / Trunk Assessment: Normal  Communication   Communication: No difficulties  Cognition Arousal/Alertness: Awake/alert Behavior During  Therapy: WFL for tasks assessed/performed Overall Cognitive Status: Within Functional Limits for tasks assessed                                          General Comments      Exercises     Assessment/Plan    PT Assessment Patient needs continued PT services  PT Problem List Decreased strength;Decreased mobility;Decreased safety awareness;Decreased activity tolerance;Decreased range of motion;Decreased knowledge of use of DME;Pain       PT Treatment Interventions DME instruction;Therapeutic activities;Gait training;Therapeutic exercise;Patient/family education;Functional mobility training;Stair training    PT Goals (Current goals can be found in the Care Plan section)  Acute Rehab PT Goals Patient Stated Goal: go home PT Goal Formulation: With patient/family Time For Goal Achievement: 09/07/21 Potential to Achieve Goals: Good    Frequency 7X/week     Co-evaluation               AM-PAC PT "6 Clicks" Mobility  Outcome Measure Help needed turning from your back to your side while in a flat bed without using bedrails?: A Little Help needed moving from lying on your back to sitting on the side of a flat bed without using bedrails?: A Little Help needed moving to and from a bed to a chair (including a wheelchair)?: A Little Help needed standing up from a chair using your arms (e.g., wheelchair or bedside chair)?: A Little Help needed to walk in hospital room?: A Little Help needed climbing 3-5 steps with a railing? : A Little 6 Click Score: 18    End of Session Equipment Utilized During Treatment: Gait belt Activity Tolerance: Patient tolerated treatment well Patient left: in bed;with call bell/phone within reach;with family/visitor present Nurse Communication: Mobility status PT Visit Diagnosis: Difficulty in walking, not elsewhere classified (R26.2);Pain Pain - Right/Left: Right Pain - part of body: Knee    Time: 1430-1459 PT Time Calculation  (min) (ACUTE ONLY): 29 min   Charges:   PT Evaluation $PT Eval Low Complexity: 1 Low PT Treatments $Gait Training: 8-22 mins        [place] Office [phone] Weekend [phone]   Claretha Cooper 08/31/2021, 3:08 PM

## 2021-09-01 ENCOUNTER — Other Ambulatory Visit: Payer: Self-pay | Admitting: Cardiology

## 2021-09-01 ENCOUNTER — Encounter (HOSPITAL_COMMUNITY): Payer: Self-pay | Admitting: Orthopedic Surgery

## 2021-09-01 DIAGNOSIS — M1711 Unilateral primary osteoarthritis, right knee: Secondary | ICD-10-CM | POA: Diagnosis not present

## 2021-09-01 LAB — GLUCOSE, CAPILLARY
Glucose-Capillary: 167 mg/dL — ABNORMAL HIGH (ref 70–99)
Glucose-Capillary: 182 mg/dL — ABNORMAL HIGH (ref 70–99)

## 2021-09-01 MED ORDER — OXYCODONE HCL 5 MG PO TABS: 5.0000 mg | ORAL_TABLET | Freq: Four times a day (QID) | ORAL | 0 refills | Status: DC | PRN

## 2021-09-01 MED ORDER — METHOCARBAMOL 500 MG PO TABS: 500.0000 mg | ORAL_TABLET | Freq: Four times a day (QID) | ORAL | 0 refills | Status: DC | PRN

## 2021-09-01 NOTE — Discharge Summary (Signed)
SPORTS MEDICINE & JOINT REPLACEMENT   Lara Mulch, MD   Carlyon Shadow, PA-C [place], [place], [place]  [id number]                             [phone]  PATIENT ID: Carolyn [name]        MRN:  [id number]          DOB/AGE: Jul 16, 1950 / 71 y.o.    DISCHARGE SUMMARY  ADMISSION DATE:    08/31/2021 DISCHARGE DATE:   09/01/2021   ADMISSION DIAGNOSIS: S/P total knee replacement [Z96.659]    DISCHARGE DIAGNOSIS:  Osteoarthritis of right knee M17.11    ADDITIONAL DIAGNOSIS: Principal Problem:   S/P total knee replacement  Past Medical History:  Diagnosis Date   Acquired hypothyroidism 07/07/2016   Acute gastritis 07/07/2016   Acute pain of right knee 09/20/2019   Allergic rhinitis 07/07/2016   Anemia 07/07/2016   Anxiety 07/07/2016   Arthritis    Osteoarthritis   ATRIAL FIBRILLATION 11/27/2008   Qualifier: Diagnosis of  By: Genevie Cheshire PharmD, Sally     Atypical chest pain 07/07/2016   Benign essential hypertension 07/07/2016   Overview:  stable on current meds   Benign paroxysmal positional vertigo 07/07/2016   Overview:  Avoid rapid headmovement and position change. May taper the gabapentin to see if helps.   Bilateral low back pain without sciatica 07/07/2016   Overview:  continue gabapentin and meloxicam,   Bimalleolar ankle fracture 02/21/2014   Bronchitis 07/07/2016   Carpal tunnel syndrome    CHF (congestive heart failure) ([place])    08-2013   Chronic anticoagulation 12/03/2014   Chronic diastolic heart failure ([place]) 12/03/2014   CKD (chronic kidney disease) stage 3, GFR 30-59 ml/min (HCC) 11/21/2014   COPD (chronic obstructive pulmonary disease) ([place]) 07/07/2016   DM2 (diabetes mellitus, type 2) (HCC)    Edema, peripheral 07/07/2016   Overview:  increase lasix to 40 mg bid   Enthesopathy 07/07/2016   Esophagitis, reflux 07/07/2016   Essential hypertension 05/06/2015   Functional diarrhea 07/07/2016   GERD (gastroesophageal reflux disease)    Headache 07/07/2016   Heart  failure ([place])    History of diverticulosis 07/07/2016   HLD (hyperlipidemia)    HTN (hypertension)    Hyperlipidemia 05/06/2015   Hypertensive heart disease 07/07/2016   Overview:  stable on current meds   Hypertensive heart disease with heart failure ([place]) 12/03/2014   Hypothyroid 07/07/2016   Hypothyroidism    Irritable bowel syndrome 07/07/2016   Overview:  IBS information sheet provided,  dietary changes suggested   Low back pain 07/07/2016   Overview:  lumbar spine [facility] at [facility]   Major depressive disorder, recurrent, moderate ([place]) 07/07/2016   Microalbuminuria due to type 2 diabetes mellitus ([place]) 05/06/2015   Midline low back pain without sciatica 07/07/2016   Overview:  --RTC for warning signs (Fevers, chills, loss of bowel/bladder function, saddle numbness), worsening pain or failure for pain to improve with symptomatic treatment --reviewed natural course of disease that acute LBP takes 4-6 weeks to recover --cont meds listed for symptomatic relief --follow up in 2-4 weeks or earlier if warning signs develop   Mixed dyslipidemia 03/17/2019   Nerve pain 07/07/2016   Obesity    Obesity (BMI 30-39.9) 05/06/2015   OSA (obstructive sleep apnea) 07/07/2016   Osteoarthritis 07/07/2016   Other ventral hernia 07/07/2016   Overweight 07/07/2016   Pacemaker 11/21/2014   PAF (paroxysmal  atrial fibrillation) (HCC)    Paroxysmal atrial fibrillation ([place]) 11/27/2008   CHADS2 vasc=4    Presence of permanent cardiac pacemaker    09-24-13 01-21-14 Last interrogation in Epic at [place]   Rhinosinusitis 07/07/2016   Seasonal allergies 07/07/2016   Shortness of breath 04/11/2019   Sleep apnea    No cpap used   Smoker 07/07/2016   SSS (sick sinus syndrome) ([place]) 11/21/2014   Symptomatic menopausal or female climacteric states 07/07/2016   Thoracic back pain 07/07/2016   Type II or unspecified type diabetes mellitus with renal manifestations, uncontrolled 07/07/2016    PROCEDURE: Procedure(s): TOTAL  KNEE ARTHROPLASTY on 08/31/2021  CONSULTS:    HISTORY:  See H&P in chart  HOSPITAL COURSE:  Carolyn [name] is a 71 y.o. admitted on 08/31/2021 and found to have a diagnosis of Osteoarthritis of right knee M17.11.  After appropriate laboratory studies were obtained  they were taken to the operating room on 08/31/2021 and underwent Procedure(s): TOTAL KNEE ARTHROPLASTY.   They were given perioperative antibiotics:  Anti-infectives (From admission, onward)    Start     Dose/Rate Route Frequency Ordered Stop   08/31/21 0715  ceFAZolin (ANCEF) IVPB 2g/100 mL premix        2 g 200 mL/hr over 30 Minutes Intravenous On call to O.R. 08/31/21 2641 08/31/21 0946     .  Patient given tranexamic acid IV or topical and exparel intra-operatively.  Tolerated the procedure well.    POD# 1: Vital signs were stable.  Patient denied Chest pain, shortness of breath, or calf pain.  Patient was started on Aspirin twice daily at 8am.  Consults to PT, OT, and care management were made.  The patient was weight bearing as tolerated.  CPM was placed on the operative leg 0-90 degrees for 6-8 hours a day. When out of the CPM, patient was placed in the foam block to achieve full extension. Incentive spirometry was taught.  Dressing was changed.       POD #2, Continued  PT for ambulation and exercise program.  IV saline locked.  O2 discontinued.    The remainder of the hospital course was dedicated to ambulation and strengthening.   The patient was discharged on 1 Day Post-Op in  Good condition.  Blood products given:none  DIAGNOSTIC STUDIES: Recent vital signs: Patient Vitals for the past 24 hrs:  BP Temp Temp src Pulse Resp SpO2  09/01/21 1022 116/72 (!) 97.4 F (36.3 C) -- 78 18 97 %  09/01/21 0644 (!) 146/76 97.7 F (36.5 C) Oral 82 17 100 %  09/01/21 0154 135/77 (!) 97.5 F (36.4 C) Oral 90 16 92 %  08/31/21 2137 (!) 147/74 97.9 F (36.6 C) Oral 96 17 97 %       Recent laboratory studies: Recent  Labs    08/26/21 1421  WBC 7.1  HGB 14.7  HCT 44.4  PLT 210   Recent Labs    08/26/21 1421  NA 140  K 4.3  CL 114*  CO2 22  BUN 11  CREATININE 0.86  GLUCOSE 166*  CALCIUM 9.0   Lab Results  Component Value Date   INR 1.13 07/26/2017   INR 1.41 02/21/2014   INR 2.2 04/04/2009     Recent Radiographic Studies :  No results found.  DISCHARGE INSTRUCTIONS: Discharge Instructions     Call MD / Call 911   Complete by: As directed    If you experience chest pain or shortness  of breath, CALL 911 and be transported to the hospital emergency room.  If you develope a fever above 101 F, pus (white drainage) or increased drainage or redness at the wound, or calf pain, call your surgeon's office.   Constipation Prevention   Complete by: As directed    Drink plenty of fluids.  Prune juice may be helpful.  You may use a stool softener, such as Colace (over the counter) 100 mg twice a day.  Use MiraLax (over the counter) for constipation as needed.   Diet - low sodium heart healthy   Complete by: As directed    Discharge instructions   Complete by: As directed    INSTRUCTIONS AFTER JOINT REPLACEMENT   Remove items at home which could result in a fall. This includes throw rugs or furniture in walking pathways ICE to the affected joint every three hours while awake for 30 minutes at a time, for at least the first 3-5 days, and then as needed for pain and swelling.  Continue to use ice for pain and swelling. You may notice swelling that will progress down to the foot and ankle.  This is normal after surgery.  Elevate your leg when you are not up walking on it.   Continue to use the breathing machine you got in the hospital (incentive spirometer) which will help keep your temperature down.  It is common for your temperature to cycle up and down following surgery, especially at night when you are not up moving around and exerting yourself.  The breathing machine keeps your lungs expanded and  your temperature down.   DIET:  As you were doing prior to hospitalization, we recommend a well-balanced diet.  DRESSING / WOUND CARE / SHOWERING  Keep the surgical dressing until follow up.  The dressing is water proof, so you can shower without any extra covering.  IF THE DRESSING FALLS OFF or the wound gets wet inside, change the dressing with sterile gauze.  Please use good hand washing techniques before changing the dressing.  Do not use any lotions or creams on the incision until instructed by your surgeon.    ACTIVITY  Increase activity slowly as tolerated, but follow the weight bearing instructions below.   No driving for 6 weeks or until further direction given by your physician.  You cannot drive while taking narcotics.  No lifting or carrying greater than 10 lbs. until further directed by your surgeon. Avoid periods of inactivity such as sitting longer than an hour when not asleep. This helps prevent blood clots.  You may return to work once you are authorized by your doctor.     WEIGHT BEARING   Weight bearing as tolerated with assist device (walker, cane, etc) as directed, use it as long as suggested by your surgeon or therapist, typically at least 4-6 weeks.   EXERCISES  Results after joint replacement surgery are often greatly improved when you follow the exercise, range of motion and muscle strengthening exercises prescribed by your doctor. Safety measures are also important to protect the joint from further injury. Any time any of these exercises cause you to have increased pain or swelling, decrease what you are doing until you are comfortable again and then slowly increase them. If you have problems or questions, call your caregiver or physical therapist for advice.   Rehabilitation is important following a joint replacement. After just a few days of immobilization, the muscles of the leg can become weakened and shrink (atrophy).  These exercises are designed to build  up the tone and strength of the thigh and leg muscles and to improve motion. Often times heat used for twenty to thirty minutes before working out will loosen up your tissues and help with improving the range of motion but do not use heat for the first two weeks following surgery (sometimes heat can increase post-operative swelling).   These exercises can be done on a training (exercise) mat, on the floor, on a table or on a bed. Use whatever works the best and is most comfortable for you.    Use music or television while you are exercising so that the exercises are a pleasant break in your day. This will make your life better with the exercises acting as a break in your routine that you can look forward to.   Perform all exercises about fifteen times, three times per day or as directed.  You should exercise both the operative leg and the other leg as well.  Exercises include:   Quad Sets - Tighten up the muscle on the front of the thigh (Quad) and hold for 5-10 seconds.   Straight Leg Raises - With your knee straight (if you were given a brace, keep it on), lift the leg to 60 degrees, hold for 3 seconds, and slowly lower the leg.  Perform this exercise against resistance later as your leg gets stronger.  Leg Slides: Lying on your back, slowly slide your foot toward your buttocks, bending your knee up off the floor (only go as far as is comfortable). Then slowly slide your foot back down until your leg is flat on the floor again.  Angel Wings: Lying on your back spread your legs to the side as far apart as you can without causing discomfort.  Hamstring Strength:  Lying on your back, push your heel against the floor with your leg straight by tightening up the muscles of your buttocks.  Repeat, but this time bend your knee to a comfortable angle, and push your heel against the floor.  You may put a pillow under the heel to make it more comfortable if necessary.   A rehabilitation program following joint  replacement surgery can speed recovery and prevent re-injury in the future due to weakened muscles. Contact your doctor or a physical therapist for more information on knee rehabilitation.    CONSTIPATION  Constipation is defined medically as fewer than three stools per week and severe constipation as less than one stool per week.  Even if you have a regular bowel pattern at home, your normal regimen is likely to be disrupted due to multiple reasons following surgery.  Combination of anesthesia, postoperative narcotics, change in appetite and fluid intake all can affect your bowels.   YOU MUST use at least one of the following options; they are listed in order of increasing strength to get the job done.  They are all available over the counter, and you may need to use some, POSSIBLY even all of these options:    Drink plenty of fluids (prune juice may be helpful) and high fiber foods Colace 100 mg by mouth twice a day  Senokot for constipation as directed and as needed Dulcolax (bisacodyl), take with full glass of water  Miralax (polyethylene glycol) once or twice a day as needed.  If you have tried all these things and are unable to have a bowel movement in the first 3-4 days after surgery call either your surgeon or your primary  doctor.    If you experience loose stools or diarrhea, hold the medications until you stool forms back up.  If your symptoms do not get better within 1 week or if they get worse, check with your doctor.  If you experience "the worst abdominal pain ever" or develop nausea or vomiting, please contact the office immediately for further recommendations for treatment.   ITCHING:  If you experience itching with your medications, try taking only a single pain pill, or even half a pain pill at a time.  You can also use Benadryl over the counter for itching or also to help with sleep.   TED HOSE STOCKINGS:  Use stockings on both legs until for at least 2 weeks or as directed by  physician office. They may be removed at night for sleeping.  MEDICATIONS:  See your medication summary on the "After Visit Summary" that nursing will review with you.  You may have some home medications which will be placed on hold until you complete the course of blood thinner medication.  It is important for you to complete the blood thinner medication as prescribed.  PRECAUTIONS:  If you experience chest pain or shortness of breath - call 911 immediately for transfer to the hospital emergency department.   If you develop a fever greater that 101 F, purulent drainage from wound, increased redness or drainage from wound, foul odor from the wound/dressing, or calf pain - CONTACT YOUR SURGEON.                                                   FOLLOW-UP APPOINTMENTS:  If you do not already have a post-op appointment, please call the office for an appointment to be seen by your surgeon.  Guidelines for how soon to be seen are listed in your "After Visit Summary", but are typically between 1-4 weeks after surgery.  OTHER INSTRUCTIONS:   Knee Replacement:  Do not place pillow under knee, focus on keeping the knee straight while resting. CPM instructions: 0-90 degrees, 2 hours in the morning, 2 hours in the afternoon, and 2 hours in the evening. Place foam block, curve side up under heel at all times except when in CPM or when walking.  DO NOT modify, tear, cut, or change the foam block in any way.  POST-OPERATIVE OPIOID TAPER INSTRUCTIONS: It is important to wean off of your opioid medication as soon as possible. If you do not need pain medication after your surgery it is ok to stop day one. Opioids include: Codeine, Hydrocodone(Norco, Vicodin), Oxycodone(Percocet, oxycontin) and hydromorphone amongst others.  Long term and even short term use of opiods can cause: Increased pain response Dependence Constipation Depression Respiratory depression And more.  Withdrawal symptoms can include Flu  like symptoms Nausea, vomiting And more Techniques to manage these symptoms Hydrate well Eat regular healthy meals Stay active Use relaxation techniques(deep breathing, meditating, yoga) Do Not substitute Alcohol to help with tapering If you have been on opioids for less than two weeks and do not have pain than it is ok to stop all together.  Plan to wean off of opioids This plan should start within one week post op of your joint replacement. Maintain the same interval or time between taking each dose and first decrease the dose.  Cut the total daily intake of opioids by one  tablet each day Next start to increase the time between doses. The last dose that should be eliminated is the evening dose.     MAKE SURE YOU:  Understand these instructions.  Get help right away if you are not doing well or get worse.    Thank you for letting us be a part of your medical care team.  It is a privilege we respect greatly.  We hope these instructions will help you stay on track for a fast and full recovery!   Increase activity slowly as tolerated   Complete by: As directed    Post-operative opioid taper instructions:   Complete by: As directed    POST-OPERATIVE OPIOID TAPER INSTRUCTIONS: It is important to wean off of your opioid medication as soon as possible. If you do not need pain medication after your surgery it is ok to stop day one. Opioids include: Codeine, Hydrocodone(Norco, Vicodin), Oxycodone(Percocet, oxycontin) and hydromorphone amongst others.  Long term and even short term use of opiods can cause: Increased pain response Dependence Constipation Depression Respiratory depression And more.  Withdrawal symptoms can include Flu like symptoms Nausea, vomiting And more Techniques to manage these symptoms Hydrate well Eat regular healthy meals Stay active Use relaxation techniques(deep breathing, meditating, yoga) Do Not substitute Alcohol to help with tapering If you have  been on opioids for less than two weeks and do not have pain than it is ok to stop all together.  Plan to wean off of opioids This plan should start within one week post op of your joint replacement. Maintain the same interval or time between taking each dose and first decrease the dose.  Cut the total daily intake of opioids by one tablet each day Next start to increase the time between doses. The last dose that should be eliminated is the evening dose.          DISCHARGE MEDICATIONS:   Allergies as of 09/01/2021       Reactions   Meperidine Nausea And Vomiting, Other (See Comments)   Was given patch behind her ear and was better   Hydrocodone-acetaminophen Other (See Comments)   REACTION: Patient states she doesnt break in hives or anything. Just feels bad when she takes it.        Medication List     TAKE these medications    acetaminophen 500 MG tablet Commonly known as: TYLENOL Take 1,000 mg by mouth every 6 (six) hours as needed for mild pain.   albuterol 108 (90 Base) MCG/ACT inhaler Commonly known as: VENTOLIN HFA Inhale 2 puffs into the lungs every 6 (six) hours as needed for wheezing or shortness of breath.   apixaban 5 MG Tabs tablet Commonly known as: ELIQUIS Take 1 tablet (5 mg total) by mouth 2 (two) times daily. What changed: Another medication with the same name was removed. Continue taking this medication, and follow the directions you see here.   atorvastatin 20 MG tablet Commonly known as: LIPITOR TAKE 1 TABLET BY MOUTH EVERYDAY AT BEDTIME   empagliflozin 25 MG Tabs tablet Commonly known as: JARDIANCE Take 25 mg by mouth daily.   escitalopram 20 MG tablet Commonly known as: LEXAPRO Take 20 mg by mouth every other day.   gabapentin 300 MG capsule Commonly known as: NEURONTIN Take 300 mg by mouth 2 (two) times daily.   glucose blood test strip 1 strip by Misc.(Non-Drug; Combo Route) route 2 times daily.   Insulin Pen Needle 32G X 4 MM  Misc  Use pen needles as directed for diabetic medications   levothyroxine 175 MCG tablet Commonly known as: SYNTHROID Take 175 mcg by mouth daily before breakfast.   methocarbamol 500 MG tablet Commonly known as: ROBAXIN Take 1-2 tablets (500-1,000 mg total) by mouth every 6 (six) hours as needed for muscle spasms.   metoprolol tartrate 50 MG tablet Commonly known as: LOPRESSOR Take 1 tablet (50 mg total) by mouth 2 (two) times daily.   midodrine 5 MG tablet Commonly known as: PROAMATINE TAKE 1 TABLET (5 MG TOTAL) BY MOUTH 3 (THREE) TIMES DAILY WITH MEALS.   omega-3 acid ethyl esters 1 g capsule Commonly known as: LOVAZA TAKE 2 CAPSULES BY MOUTH 2 TIMES DAILY.   oxyCODONE 5 MG immediate release tablet Commonly known as: Oxy IR/ROXICODONE Take 1-2 tablets (5-10 mg total) by mouth every 6 (six) hours as needed for moderate pain (pain score 4-6).   Ozempic (1 MG/DOSE) 4 MG/3ML Sopn Generic drug: Semaglutide (1 MG/DOSE) Inject 2 mg into the skin every Monday.   pantoprazole 40 MG tablet Commonly known as: PROTONIX Take 40 mg by mouth every morning.   Symbicort 160-4.5 MCG/ACT inhaler Generic drug: budesonide-formoterol Inhale 2 puffs into the lungs 2 (two) times daily as needed for shortness of breath.   Tyler Aas FlexTouch 100 UNIT/ML FlexTouch Pen Generic drug: insulin degludec Inject 80 Units into the skin at bedtime.   Vitamin D (Ergocalciferol) 1.25 MG (50000 UNIT) Caps capsule Commonly known as: DRISDOL Take 50,000 Units by mouth every 'Sunday.               Durable Medical Equipment  (From admission, onward)           Start     Ordered   08/31/21 1223  DME Walker rolling  Once       Question:  Patient needs a walker to treat with the following condition  Answer:  S/P total knee replacement   08/31/21 1222   08/31/21 1223  DME 3 n 1  Once        08'$ /07/23 1222   08/31/21 1223  DME Bedside commode  Once       Question:  Patient needs a bedside commode  to treat with the following condition  Answer:  S/P total knee replacement   08/31/21 1222            FOLLOW UP VISIT:    DISPOSITION: HOME VS. SNF  Dental Antibiotics:  In most cases prophylactic antibiotics for Dental procdeures after total joint surgery are not necessary.  Exceptions are as follows:  1. History of prior total joint infection  2. Severely immunocompromised (Organ Transplant, cancer chemotherapy, Rheumatoid biologic meds such as [place])  3. Poorly controlled diabetes (A1C &gt; 8.0, blood glucose over 200)  If you have one of these conditions, contact your surgeon for an antibiotic prescription, prior to your dental procedure.   CONDITION:  Good   Donia Ast 09/01/2021, 12:44 PM

## 2021-09-01 NOTE — Progress Notes (Signed)
Physical Therapy Treatment Patient Details Name: Carolyn [name] MRN: [id number] DOB: 15-Nov-1950 Today's Date: 09/01/2021   History of Present Illness 71 yo female , s/P RTKA 08/31/21. PMH: L ankle ORIF, PAF, DM, HTN    PT Comments    POD # 1 pm session Assisted with amb in hallway a second time, practiced steps, performed remaining TKR TE's, then asissted to bathroom. Pt has met her mobility goals to D/C to home today.   Recommendations for follow up therapy are one component of a multi-disciplinary discharge planning process, led by the attending physician.  Recommendations may be updated based on patient status, additional functional criteria and insurance authorization.  Follow Up Recommendations  Follow physician's recommendations for discharge plan and follow up therapies     Assistance Recommended at Discharge Set up Supervision/Assistance  Patient can return home with the following A little help with bathing/dressing/bathroom;Assistance with cooking/housework;Assist for transportation   Equipment Recommendations  None recommended by PT    Recommendations for Other Services       Precautions / Restrictions Precautions Precautions: Fall;Knee Precaution Comments: instructed no pillow under knee Restrictions Weight Bearing Restrictions: No Other Position/Activity Restrictions: WBAT     Mobility  Bed Mobility               General bed mobility comments: OOB in recliner    Transfers Overall transfer level: Needs assistance Equipment used: Rolling walker (2 wheels) Transfers: Sit to/from Stand Sit to Stand: Supervision, Min guard           General transfer comment: 25% VC's on proper hand placement and safety using walker with turns.  Also asissted with a toilet transfer.    Ambulation/Gait Ambulation/Gait assistance: Supervision, Min guard Gait Distance (Feet): 65 Feet Assistive device: Rolling walker (2 wheels) Gait Pattern/deviations: Step-through  pattern, Step-to pattern, Antalgic Gait velocity: decr     General Gait Details: 25% VC's on proper walker to self distance   Stairs Stairs: Yes Stairs assistance: Supervision, Min guard Stair Management: No rails, Step to pattern, Forwards, With walker Number of Stairs: 1 General stair comments: 25% VC's on proper walker placement as well as sequencing.  Performed twice with friend observing.   Wheelchair Mobility    Modified Rankin (Stroke Patients Only)       Balance                                            Cognition Arousal/Alertness: Awake/alert Behavior During Therapy: WFL for tasks assessed/performed Overall Cognitive Status: Within Functional Limits for tasks assessed                                 General Comments: AxO x 3 pleasant and motivated "I have to keep movin".        Exercises  05 reps seated TE's following HEP handout    General Comments        Pertinent Vitals/Pain Pain Assessment Pain Assessment: 0-10 Pain Score: 5  Pain Location: right knee Pain Descriptors / Indicators: Discomfort, Aching Pain Intervention(s): Monitored during session, Premedicated before session, Repositioned, Ice applied    Home Living                          Prior Function  PT Goals (current goals can now be found in the care plan section) Progress towards PT goals: Progressing toward goals    Frequency    7X/week      PT Plan Current plan remains appropriate    Co-evaluation              AM-PAC PT "6 Clicks" Mobility   Outcome Measure  Help needed turning from your back to your side while in a flat bed without using bedrails?: A Little Help needed moving from lying on your back to sitting on the side of a flat bed without using bedrails?: A Little Help needed moving to and from a bed to a chair (including a wheelchair)?: A Little Help needed standing up from a chair using your arms  (e.g., wheelchair or bedside chair)?: A Little Help needed to walk in hospital room?: A Little Help needed climbing 3-5 steps with a railing? : A Little 6 Click Score: 18    End of Session Equipment Utilized During Treatment: Gait belt Activity Tolerance: Patient tolerated treatment well Patient left: in chair;with call bell/phone within reach;with family/visitor present Nurse Communication: Mobility status PT Visit Diagnosis: Difficulty in walking, not elsewhere classified (R26.2);Pain Pain - part of body: Knee     Time: 1410-1436 PT Time Calculation (min) (ACUTE ONLY): 26 min  Charges:  $Gait Training: 8-22 mins $Therapeutic Activity: 8-22 mins                     Rica Koyanagi  PTA [place] [facility] M-F          [phone] Weekend pager [phone]

## 2021-09-01 NOTE — TOC Transition Note (Signed)
Transition of Care [facility]) - CM/SW Discharge Note  Patient Details  Name: Carolyn [name] MRN: [id number] Date of Birth: [birth year]  [facility] of [facility] CM/SW Contact:  Sherie Don, LCSW Phone Number: 09/01/2021, 10:18 AM  Clinical Narrative: Patient is expected to discharge home after working with PT. CSW met with patient to confirm discharge plan. Patient will go home with OPPT at [facility] in [place] with the first appointment scheduled for 09/10/21. Patient was set up with a youth rolling walker and CPM through [facility] prior to surgery, so there are no DME needs at this time. TOC signing off.  Final next level of care: OP Rehab Barriers to Discharge: No Barriers Identified  Patient Goals and CMS Choice Patient states their goals for this hospitalization and ongoing recovery are:: Discharge home with OPPT at [facility] in [place] Choice offered to / list presented to : NA  Discharge Plan and Services         DME Arranged: N/A DME Agency: NA  Readmission Risk Interventions     No data to display

## 2021-09-01 NOTE — Progress Notes (Signed)
SPORTS MEDICINE AND JOINT REPLACEMENT  Lara Mulch, MD    Carlyon Shadow, PA-C [place], [place], [place]  [id number]                             [phone]   PROGRESS NOTE  Subjective:  negative for Chest Pain  negative for Shortness of Breath  negative for Nausea/Vomiting   negative for Calf Pain  negative for Bowel Movement   Tolerating Diet: yes         Patient reports pain as 4 on 0-10 scale.    Objective: Vital signs in last 24 hours:   Patient Vitals for the past 24 hrs:  BP Temp Temp src Pulse Resp SpO2  09/01/21 1022 116/72 (!) 97.4 F (36.3 C) -- 78 18 97 %  09/01/21 0644 (!) 146/76 97.7 F (36.5 C) Oral 82 17 100 %  09/01/21 0154 135/77 (!) 97.5 F (36.4 C) Oral 90 16 92 %  08/31/21 2137 (!) 147/74 97.9 F (36.6 C) Oral 96 17 97 %    '@flow'$ {1959:LAST@   Intake/Output from previous day:   08/07 0701 - 08/08 0700 In: 1092 [I.V.:992] Out: 3125 [Urine:3100]   Intake/Output this shift:   08/08 0701 - 08/08 1900 In: -  Out: 250 [Urine:250]   Intake/Output      08/07 0701 08/08 0700 08/08 0701 08/09 0700   I.V. (mL/kg) 992 (10.7)    IV Piggyback 100    Total Intake(mL/kg) 1092 (11.7)    Urine (mL/kg/hr) 3100 (1.4) 250 (0.5)   Blood 25    Total Output 3125 250   Net -2033 -250        Urine Occurrence  1 x      LABORATORY DATA: Recent Labs    08/26/21 1421  WBC 7.1  HGB 14.7  HCT 44.4  PLT 210   Recent Labs    08/26/21 1421  NA 140  K 4.3  CL 114*  CO2 22  BUN 11  CREATININE 0.86  GLUCOSE 166*  CALCIUM 9.0   Lab Results  Component Value Date   INR 1.13 07/26/2017   INR 1.41 02/21/2014   INR 2.2 04/04/2009    Examination:  General appearance: alert, cooperative, and no distress Extremities: extremities normal, atraumatic, no cyanosis or edema  Wound Exam: clean, dry, intact   Drainage:  None: wound tissue dry  Motor Exam: Quadriceps and Hamstrings Intact  Sensory Exam: Superficial Peroneal, Deep Peroneal,  and Tibial normal   Assessment:    1 Day Post-Op  Procedure(s) (LRB): TOTAL KNEE ARTHROPLASTY (Right)  ADDITIONAL DIAGNOSIS:  Principal Problem:   S/P total knee replacement     Plan: Physical Therapy as ordered Weight Bearing as Tolerated (WBAT)  DVT Prophylaxis:  Aspirin  DISCHARGE PLAN: Home  Patient doing well and ready for D/C home       Patient's anticipated LOS is less than 2 midnights, meeting these requirements: - Lives within 1 hour of care - Has a competent adult at home to recover with post-op recover - NO history of  - Chronic pain requiring opiods  - Diabetes  - Coronary Artery Disease  - Heart failure  - Heart attack  - Stroke  - DVT/VTE  - Cardiac arrhythmia  - Respiratory Failure/COPD  - Renal failure  - Anemia  - Advanced Liver disease      Donia Ast 09/01/2021, 12:42 PM

## 2021-09-01 NOTE — Progress Notes (Signed)
Physical Therapy Treatment Patient Details Name: Carolyn [name] MRN: [id number] DOB: [birth year] Today's Date: 09/01/2021   History of Present Illness 71 yo female , s/P RTKA 08/31/21. PMH: L ankle ORIF, PAF, DM, HTN    PT Comments    POD # 1 am session Pt already OOB in recliner.  Assisted with amb in hallway then to bathroom followed by TE's following HEP handout.  Pt will need another PT session to complete HEP and practice one step she has to enter home.   Recommendations for follow up therapy are one component of a multi-disciplinary discharge planning process, led by the attending physician.  Recommendations may be updated based on patient status, additional functional criteria and insurance authorization.  Follow Up Recommendations  Follow physician's recommendations for discharge plan and follow up therapies     Assistance Recommended at Discharge Set up Supervision/Assistance  Patient can return home with the following A little help with bathing/dressing/bathroom;Assistance with cooking/housework;Assist for transportation   Equipment Recommendations  None recommended by PT    Recommendations for Other Services       Precautions / Restrictions Precautions Precautions: Fall;Knee Precaution Comments: instructed no pillow under knee Restrictions Weight Bearing Restrictions: No Other Position/Activity Restrictions: WBAT     Mobility  Bed Mobility               General bed mobility comments: OOB in recliner    Transfers Overall transfer level: Needs assistance Equipment used: Rolling walker (2 wheels) Transfers: Sit to/from Stand Sit to Stand: Supervision, Min guard           General transfer comment: 25% VC's on proper hand placement and safety using walker with turns.  Also asissted with a toilet transfer.    Ambulation/Gait Ambulation/Gait assistance: Supervision, Min guard Gait Distance (Feet): 65 Feet Assistive device: Rolling walker (2  wheels) Gait Pattern/deviations: Step-through pattern, Step-to pattern, Antalgic Gait velocity: decr     General Gait Details: 25% VC's on proper walker to self distance   Stairs             Wheelchair Mobility    Modified Rankin (Stroke Patients Only)       Balance                                            Cognition Arousal/Alertness: Awake/alert Behavior During Therapy: WFL for tasks assessed/performed Overall Cognitive Status: Within Functional Limits for tasks assessed                                 General Comments: AxO x 3 pleasant and motivated "I have to keep movin".        Exercises  Total Knee Replacement TE's following HEP handout 10 reps B LE ankle pumps 05 reps towel squeezes 05 reps knee presses 05 reps heel slides  05 reps SAQ's 05 reps SLR's 05 reps ABD Educated on use of gait belt to assist with TE's Followed by ICE     General Comments        Pertinent Vitals/Pain Pain Assessment Pain Assessment: 0-10 Pain Score: 5  Pain Location: right knee Pain Descriptors / Indicators: Discomfort, Aching Pain Intervention(s): Monitored during session, Premedicated before session, Repositioned, Ice applied    Home Living  Prior Function            PT Goals (current goals can now be found in the care plan section) Progress towards PT goals: Progressing toward goals    Frequency    7X/week      PT Plan Current plan remains appropriate    Co-evaluation              AM-PAC PT "6 Clicks" Mobility   Outcome Measure  Help needed turning from your back to your side while in a flat bed without using bedrails?: A Little Help needed moving from lying on your back to sitting on the side of a flat bed without using bedrails?: A Little Help needed moving to and from a bed to a chair (including a wheelchair)?: A Little Help needed standing up from a chair using your  arms (e.g., wheelchair or bedside chair)?: A Little Help needed to walk in hospital room?: A Little Help needed climbing 3-5 steps with a railing? : A Little 6 Click Score: 18    End of Session Equipment Utilized During Treatment: Gait belt Activity Tolerance: Patient tolerated treatment well Patient left: in chair;with call bell/phone within reach;with family/visitor present Nurse Communication: Mobility status PT Visit Diagnosis: Difficulty in walking, not elsewhere classified (R26.2);Pain Pain - part of body: Knee     Time: 1022-1050 PT Time Calculation (min) (ACUTE ONLY): 28 min  Charges:  $Gait Training: 8-22 mins $Therapeutic Exercise: 8-22 mins                     Rica Koyanagi  PTA [place] [facility] M-F          [phone] Weekend pager [phone]

## 2021-09-01 NOTE — Progress Notes (Signed)
Discharge instructions and medications reviewed with patient and caregiver. All questions and concerns addressed. IV removed per protocol, tolerated well, intact. All belongings given. No s/sx of distress noted.

## 2021-09-07 ENCOUNTER — Other Ambulatory Visit (HOSPITAL_COMMUNITY): Payer: PPO

## 2021-10-21 ENCOUNTER — Ambulatory Visit (INDEPENDENT_AMBULATORY_CARE_PROVIDER_SITE_OTHER): Payer: PPO

## 2021-10-21 DIAGNOSIS — I495 Sick sinus syndrome: Secondary | ICD-10-CM

## 2021-10-23 LAB — CUP PACEART REMOTE DEVICE CHECK
Battery Impedance: 530 Ohm
Battery Remaining Longevity: 104 mo
Battery Voltage: 2.79 V
Brady Statistic AP VP Percent: 1 %
Brady Statistic AP VS Percent: 20 %
Brady Statistic AS VP Percent: 0 %
Brady Statistic AS VS Percent: 79 %
Date Time Interrogation Session: 20230928061714
Implantable Lead Implant Date: 20150831
Implantable Lead Implant Date: 20150831
Implantable Lead Location: 753859
Implantable Lead Location: 753860
Implantable Lead Model: 4092
Implantable Lead Model: 5076
Implantable Pulse Generator Implant Date: 20150831
Lead Channel Impedance Value: 559 Ohm
Lead Channel Impedance Value: 649 Ohm
Lead Channel Pacing Threshold Amplitude: 0.625 V
Lead Channel Pacing Threshold Amplitude: 1.375 V
Lead Channel Pacing Threshold Pulse Width: 0.4 ms
Lead Channel Pacing Threshold Pulse Width: 0.4 ms
Lead Channel Setting Pacing Amplitude: 1.5 V
Lead Channel Setting Pacing Amplitude: 2.75 V
Lead Channel Setting Pacing Pulse Width: 0.4 ms
Lead Channel Setting Sensing Sensitivity: 2.8 mV

## 2021-10-29 NOTE — Progress Notes (Signed)
Remote pacemaker transmission.   

## 2021-11-11 ENCOUNTER — Other Ambulatory Visit: Payer: Self-pay

## 2021-11-12 ENCOUNTER — Encounter: Payer: Self-pay | Admitting: Cardiology

## 2021-11-12 ENCOUNTER — Ambulatory Visit: Payer: PPO | Attending: Cardiology | Admitting: Cardiology

## 2021-11-12 VITALS — BP 116/72 | HR 84 | Ht 63.6 in | Wt 192.4 lb

## 2021-11-12 DIAGNOSIS — I110 Hypertensive heart disease with heart failure: Secondary | ICD-10-CM

## 2021-11-12 DIAGNOSIS — I951 Orthostatic hypotension: Secondary | ICD-10-CM

## 2021-11-12 DIAGNOSIS — Z7901 Long term (current) use of anticoagulants: Secondary | ICD-10-CM

## 2021-11-12 DIAGNOSIS — Z950 Presence of cardiac pacemaker: Secondary | ICD-10-CM

## 2021-11-12 DIAGNOSIS — I495 Sick sinus syndrome: Secondary | ICD-10-CM

## 2021-11-12 DIAGNOSIS — N1831 Chronic kidney disease, stage 3a: Secondary | ICD-10-CM

## 2021-11-12 DIAGNOSIS — I480 Paroxysmal atrial fibrillation: Secondary | ICD-10-CM | POA: Diagnosis not present

## 2021-11-12 DIAGNOSIS — I5032 Chronic diastolic (congestive) heart failure: Secondary | ICD-10-CM

## 2021-11-12 NOTE — Patient Instructions (Addendum)
Medication Instructions:  Your physician recommends that you continue on your current medications as directed. Please refer to the Current Medication list given to you today.  *If you need a refill on your cardiac medications before your next appointment, please call your pharmacy*   Lab Work: None ordered If you have labs (blood work) drawn today and your tests are completely normal, you will receive your results only by: [place] (if you have MyChart) OR A paper copy in the mail If you have any lab test that is abnormal or we need to change your treatment, we will call you to review the results.   Testing/Procedures: None ordered   Follow-Up: At [facility], you and your health needs are our priority.  As part of our continuing mission to provide you with exceptional heart care, we have created designated Provider Care Teams.  These Care Teams include your primary Cardiologist (physician) and Advanced Practice Providers (APPs -  Physician Assistants and Nurse Practitioners) who all work together to provide you with the care you need, when you need it.  We recommend signing up for the patient portal called "MyChart".  Sign up information is provided on this After Visit Summary.  MyChart is used to connect with patients for Virtual Visits (Telemedicine).  Patients are able to view lab/test results, encounter notes, upcoming appointments, etc.  Non-urgent messages can be sent to your provider as well.   To learn more about what you can do with MyChart, go to [email or web address].    Your next appointment:   12 month(s)  The format for your next appointment:   In Person  Provider:   Shirlee More, MD   Other Instructions NA

## 2021-11-12 NOTE — Progress Notes (Signed)
Cardiology Office Note:    Date:  11/12/2021   ID:  ERYKAH [name], DOB Jul 21, 1950, MRN [id number]  PCP:  Myrlene Broker, MD  Cardiologist:  Shirlee More, MD    Referring MD: Myrlene Broker, MD    ASSESSMENT:    1. Paroxysmal atrial fibrillation (HCC)   2. Chronic anticoagulation   3. Sick sinus syndrome ([place])   4. Pacemaker   5. Hypertensive heart disease with chronic diastolic congestive heart failure (HCC)   6. Stage 3a chronic kidney disease ([place])   7. Orthostatic hypotension    PLAN:    In order of problems listed above:  Ameira continues to do well she is maintaining sinus rhythm no longer on antiarrhythmic drug and anticoagulated without bleeding complication she will continue her beta-blocker and current anticoagulant normal pacemaker function followed in our device clinic Stable blood pressure at target no findings of heart failure no longer on a loop diuretic but she does take an SGLT2 inhibitor.  She will continue her treatment Stable improved kidney function Stable orthostatic hypotension continue midodrine Continue her statin with diabetes LDL at target   Next appointment: 1 year   Medication Adjustments/Labs and Tests Ordered: Current medicines are reviewed at length with the patient today.  Concerns regarding medicines are outlined above.  No orders of the defined types were placed in this encounter.  No orders of the defined types were placed in this encounter.   Chief Complaint  Patient presents with   Follow-up   Atrial Fibrillation    History of Present Illness:    Carolyn [name] is a 71 y.o. female with a hx of paroxysmal atrial fibrillation with cryoablation 2017 hypertension type 2 diabetes mellitus hypothyroidism and sinus node dysfunction dual-chamber pacemaker stage III CKD and symptomatic orthostatic hypotension  last seen 02/04/2021.  Compliance with diet, lifestyle and medications: Yes  She continues to do well was told by  nephrology she did not need to follow-up because of stable CKD She has some lightheadedness but is not severe she has had no syncope and has done well with midodrine therapy He is not on a diuretic has no edema orthopnea chest pain palpitation or syncope Her LDL is at target and she has no muscle weakness or pain from her statin She tolerates her anticoagulant without bleeding  Recent laboratory test with her primary care physician 07/10/2021: Hemoglobin 15.1 platelets 219,000 05/31/2011: Sodium 145 potassium 3.9 creatinine 0.93 cholesterol 134 LDL 66 triglycerides 206 HDL 35 Past Medical History:  Diagnosis Date   Abnormal mammogram 05/10/2017   Acquired hypothyroidism 07/07/2016   Acute gastritis 07/07/2016   Acute pain of right knee 09/20/2019   AKI (acute kidney injury) ([place]) 07/25/2020   Allergic rhinitis 07/07/2016   Anemia 07/07/2016   Anxiety 07/07/2016   Arthritis    Osteoarthritis   ATRIAL FIBRILLATION 11/27/2008   Qualifier: Diagnosis of  By: Genevie Cheshire PharmD, Sally     Atypical chest pain 07/07/2016   Benign essential hypertension 07/07/2016   Overview:  stable on current meds   Benign paroxysmal positional vertigo 07/07/2016   Overview:  Avoid rapid headmovement and position change. May taper the gabapentin to see if helps.   Bilateral low back pain without sciatica 07/07/2016   Overview:  continue gabapentin and meloxicam,   Bimalleolar ankle fracture 02/21/2014   Bronchitis 07/07/2016   Carpal tunnel syndrome    CHF (congestive heart failure) ([place])    08-2013   Chronic anticoagulation 12/03/2014   Chronic diastolic  heart failure ([place]) 12/03/2014   CKD (chronic kidney disease) stage 3, GFR 30-59 ml/min (HCC) 11/21/2014   COPD (chronic obstructive pulmonary disease) ([place]) 07/07/2016   Diarrhea due to malabsorption 08/17/2017   DM2 (diabetes mellitus, type 2) (HCC)    Edema, peripheral 07/07/2016   Overview:  increase lasix to 40 mg bid   Encounter for annual physical examination  excluding gynecological examination in a patient older than 17 years 03/17/2019   Enthesopathy 07/07/2016   Esophagitis, reflux 07/07/2016   Essential hypertension 05/06/2015   Functional diarrhea 07/07/2016   GERD (gastroesophageal reflux disease)    Great toe pain, left 05/25/2018   Headache 07/07/2016   Heart failure ([place])    History of diverticulosis 07/07/2016   HLD (hyperlipidemia)    HTN (hypertension)    Hyperlipidemia 05/06/2015   Hypertensive heart disease 07/07/2016   Overview:  stable on current meds   Hypertensive heart disease with heart failure ([place]) 12/03/2014   Hypothyroid 07/07/2016   Hypothyroidism    Irritable bowel syndrome 07/07/2016   Overview:  IBS information sheet provided,  dietary changes suggested   Low back pain 07/07/2016   Overview:  l[facility] spine films at [facility]   Major depressive disorder, recurrent, moderate ([place]) 07/07/2016   Microalbuminuria due to type 2 diabetes mellitus ([place]) 05/06/2015   Midline low back pain without sciatica 07/07/2016   Overview:  --RTC for warning signs (Fevers, chills, loss of bowel/bladder function, saddle numbness), worsening pain or failure for pain to improve with symptomatic treatment --reviewed natural course of disease that acute LBP takes 4-6 weeks to recover --cont meds listed for symptomatic relief --follow up in 2-4 weeks or earlier if warning signs develop   Mild CAD 04/06/2018   Mixed dyslipidemia 03/17/2019   Nerve pain 07/07/2016   Obesity    Obesity (BMI 30-39.9) 05/06/2015   OSA (obstructive sleep apnea) 07/07/2016   Osteoarthritis 07/07/2016   Other ventral hernia 07/07/2016   Overweight 07/07/2016   Pacemaker 11/21/2014   PAF (paroxysmal atrial fibrillation) (HCC)    Paroxysmal atrial fibrillation ([place]) 11/27/2008   CHADS2 vasc=4    Presence of permanent cardiac pacemaker    09-24-13 01-21-14 Last interrogation in Epic at [facility]   Rhinosinusitis 07/07/2016   S/P total knee replacement 08/31/2021   Seasonal allergies  07/07/2016   Severe obesity (BMI 35.0-39.9) with comorbidity ([place]) 05/12/2018   Shortness of breath 04/11/2019   Sleep apnea    No cpap used   Smoker 07/07/2016   SSS (sick sinus syndrome) ([place]) 11/21/2014   Symptomatic menopausal or female climacteric states 07/07/2016   Thoracic back pain 07/07/2016   Type 2 diabetes mellitus with stage 2 chronic kidney disease, with long-term current use of insulin ([place]) 08/25/2015   Type II or unspecified type diabetes mellitus with renal manifestations, uncontrolled 07/07/2016    Past Surgical History:  Procedure Laterality Date   BREAST LUMPECTOMY WITH RADIOACTIVE SEED LOCALIZATION Right 08/02/2017   Procedure: BREAST LUMPECTOMY WITH RADIOACTIVE SEED LOCALIZATION;  Surgeon: Erroll Luna, MD;  Location: [place];  Service: General;  Laterality: Right;   CARPAL TUNNEL RELEASE     CATARACT EXTRACTION W/ INTRAOCULAR LENS IMPLANT Bilateral    CHOLECYSTECTOMY     HERNIA REPAIR     ORIF ANKLE FRACTURE Left 02/21/2014   Procedure: OPEN REDUCTION INTERNAL FIXATION (ORIF) LEFT  ANKLE FRACTURE;  Surgeon: Tobi Bastos, MD;  [facility];  Service: Orthopedics;  Laterality: Left;   PACEMAKER INSERTION  09/24/2013   TOTAL  HIP ARTHROPLASTY Left    Bursa cut   TOTAL KNEE ARTHROPLASTY Right 08/31/2021   Procedure: TOTAL KNEE ARTHROPLASTY;  Surgeon: Vickey Huger, MD;  [facility];  Service: Orthopedics;  Laterality: Right;   TUBAL LIGATION      Current Medications: Current Meds  Medication Sig   acetaminophen (TYLENOL) 500 MG tablet Take 1,000 mg by mouth every 6 (six) hours as needed for mild pain.   albuterol (PROVENTIL HFA;VENTOLIN HFA) 108 (90 Base) MCG/ACT inhaler Inhale 2 puffs into the lungs every 6 (six) hours as needed for wheezing or shortness of breath.   apixaban (ELIQUIS) 5 MG TABS tablet Take 1 tablet (5 mg total) by mouth 2 (two) times daily.   atorvastatin (LIPITOR) 20 MG tablet Take 1 tablet (20 mg total) by mouth daily.   empagliflozin  (JARDIANCE) 25 MG TABS tablet Take 25 mg by mouth daily.   escitalopram (LEXAPRO) 20 MG tablet Take 20 mg by mouth every other day.   gabapentin (NEURONTIN) 300 MG capsule Take 300 mg by mouth 2 (two) times daily.   glucose blood test strip 1 strip by Misc.(Non-Drug; Combo Route) route 2 times daily.   Insulin Pen Needle 32G X 4 MM MISC Use pen needles as directed for diabetic medications   levothyroxine (SYNTHROID) 175 MCG tablet Take 175 mcg by mouth daily before breakfast.   metoprolol tartrate (LOPRESSOR) 50 MG tablet Take 50 mg by mouth 2 (two) times daily.   midodrine (PROAMATINE) 5 MG tablet TAKE 1 TABLET (5 MG TOTAL) BY MOUTH 3 (THREE) TIMES DAILY WITH MEALS.   omega-3 acid ethyl esters (LOVAZA) 1 g capsule TAKE 2 CAPSULES BY MOUTH 2 TIMES DAILY.   oxyCODONE (OXY IR/ROXICODONE) 5 MG immediate release tablet Take 1-2 tablets (5-10 mg total) by mouth every 6 (six) hours as needed for moderate pain (pain score 4-6).   pantoprazole (PROTONIX) 40 MG tablet Take 40 mg by mouth every morning.   Semaglutide, 1 MG/DOSE, (OZEMPIC, 1 MG/DOSE,) 4 MG/3ML SOPN Inject 2 mg into the skin every Monday.   SYMBICORT 160-4.5 MCG/ACT inhaler Inhale 2 puffs into the lungs 2 (two) times daily as needed for shortness of breath.   TRESIBA FLEXTOUCH 100 UNIT/ML FlexTouch Pen Inject 80 Units into the skin at bedtime.   Vitamin D, Ergocalciferol, (DRISDOL) 1.25 MG (50000 UT) CAPS capsule Take 50,000 Units by mouth every Sunday.     Allergies:   Meperidine and Hydrocodone-acetaminophen   Social History   Socioeconomic History   Marital status: Divorced    Spouse name: Not on file   Number of children: 2   Years of education: Not on file   Highest education level: Not on file  Occupational History   Occupation: [employer]  Tobacco Use   Smoking status: Former    Years: 2.00    Types: Cigarettes    Quit date: 11/25/2008    Years since quitting: 12.9    Passive exposure: Past   Smokeless tobacco:  Never  Vaping Use   Vaping Use: Never used  Substance and Sexual Activity   Alcohol use: No   Drug use: No   Sexual activity: Not on file  Other Topics Concern   Not on file  Social History Narrative   Not on file   Social Determinants of Health   Financial Resource Strain: Not on file  Food Insecurity: Not on file  Transportation Needs: Not on file  Physical Activity: Not on file  Stress: Not on file  Social Connections: Not on file     Family History: The patient's family history includes Dementia in her mother and sister; Diabetes in her brother and sister; Heart attack in her father. ROS:   Please see the history of present illness.    All other systems reviewed and are negative.  EKGs/Labs/Other Studies Reviewed:    The following studies were reviewed today:  EKG:  EKG ordered today and personally reviewed.  The ekg ordered today demonstrates atrial paced rhythm normal QRS  Recent Labs: 08/26/2021: ALT 38; BUN 11; Creatinine, Ser 0.86; Hemoglobin 14.7; Platelets 210; Potassium 4.3; Sodium 140  Recent Lipid Panel    Component Value Date/Time   CHOL 150 09/07/2018 0919   TRIG 427 (H) 09/07/2018 0919   HDL 31 (L) 09/07/2018 0919   CHOLHDL 4.8 (H) 09/07/2018 0919   LDLCALC Comment 09/07/2018 0919    Physical Exam:    VS:  BP 116/72   Pulse 84   Ht 5' 3.6" (1.615 m)   Wt 192 lb 6.4 oz (87.3 kg)   LMP 02/26/2005   SpO2 97%   BMI 33.44 kg/m     Wt Readings from Last 3 Encounters:  11/12/21 192 lb 6.4 oz (87.3 kg)  08/31/21 205 lb (93 kg)  08/26/21 205 lb (93 kg)     GEN:  Well nourished, well developed in no acute distress HEENT: Normal NECK: No JVD; No carotid bruits LYMPHATICS: No lymphadenopathy CARDIAC: RRR, no murmurs, rubs, gallops RESPIRATORY:  Clear to auscultation without rales, wheezing or rhonchi  ABDOMEN: Soft, non-tender, non-distended MUSCULOSKELETAL:  No edema; No deformity  SKIN: Warm and dry NEUROLOGIC:  Alert and oriented x  3 PSYCHIATRIC:  Normal affect    Signed, Shirlee More, MD  11/12/2021 10:33 AM    [place]

## 2021-11-24 ENCOUNTER — Other Ambulatory Visit: Payer: Self-pay | Admitting: Cardiology

## 2021-11-29 NOTE — Progress Notes (Unsigned)
Electrophysiology Office Note   Date:  11/30/2021   ID:  Carolyn [name], DOB October 15, 1950, MRN [id number]  PCP:  Myrlene Broker, MD  Cardiologist:  Bettina Gavia Primary Electrophysiologist:  Linell Meldrum Meredith Leeds, MD    No chief complaint on file.     History of Present Illness: Carolyn [name] is a 71 y.o. female who is being seen today for the evaluation of sick sinus syndrome at the request of Carolyn [name]. Presenting today for electrophysiology evaluation.    She has a history significant for paroxysmal atrial fibrillation, hypertension, diastolic heart failure, hyperlipidemia.  She had ablation in December 2017.  She was initially on Multaq but this was stopped in 2018.  She is status post Medtronic dual-chamber pacemaker implanted for sick sinus syndrome.  Today, denies symptoms of palpitations, chest pain, shortness of breath, orthopnea, PND, lower extremity edema, claudication, dizziness, presyncope, syncope, bleeding, or neurologic sequela. The patient is tolerating medications without difficulties.     Past Medical History:  Diagnosis Date   Abnormal mammogram 05/10/2017   Acquired hypothyroidism 07/07/2016   Acute gastritis 07/07/2016   Acute pain of right knee 09/20/2019   AKI (acute kidney injury) ([place]) 07/25/2020   Allergic rhinitis 07/07/2016   Anemia 07/07/2016   Anxiety 07/07/2016   Arthritis    Osteoarthritis   ATRIAL FIBRILLATION 11/27/2008   Qualifier: Diagnosis of  By: Genevie Cheshire PharmD, Sally     Atypical chest pain 07/07/2016   Benign essential hypertension 07/07/2016   Overview:  stable on current meds   Benign paroxysmal positional vertigo 07/07/2016   Overview:  Avoid rapid headmovement and position change. May taper the gabapentin to see if helps.   Bilateral low back pain without sciatica 07/07/2016   Overview:  continue gabapentin and meloxicam,   Bimalleolar ankle fracture 02/21/2014   Bronchitis 07/07/2016   Carpal tunnel syndrome    CHF (congestive heart  failure) ([place])    08-2013   Chronic anticoagulation 12/03/2014   Chronic diastolic heart failure ([place]) 12/03/2014   CKD (chronic kidney disease) stage 3, GFR 30-59 ml/min (HCC) 11/21/2014   COPD (chronic obstructive pulmonary disease) ([place]) 07/07/2016   Diarrhea due to malabsorption 08/17/2017   DM2 (diabetes mellitus, type 2) (HCC)    Edema, peripheral 07/07/2016   Overview:  increase lasix to 40 mg bid   Encounter for annual physical examination excluding gynecological examination in a patient older than 17 years 03/17/2019   Enthesopathy 07/07/2016   Esophagitis, reflux 07/07/2016   Essential hypertension 05/06/2015   Functional diarrhea 07/07/2016   GERD (gastroesophageal reflux disease)    Great toe pain, left 05/25/2018   Headache 07/07/2016   Heart failure ([place])    History of diverticulosis 07/07/2016   HLD (hyperlipidemia)    HTN (hypertension)    Hyperlipidemia 05/06/2015   Hypertensive heart disease 07/07/2016   Overview:  stable on current meds   Hypertensive heart disease with heart failure ([place]) 12/03/2014   Hypothyroid 07/07/2016   Hypothyroidism    Irritable bowel syndrome 07/07/2016   Overview:  IBS information sheet provided,  dietary changes suggested   Low back pain 07/07/2016   Overview:  l[facility] films [facility]   Major depressive disorder, recurrent, moderate ([place]) 07/07/2016   Microalbuminuria due to type 2 diabetes mellitus ([place]) 05/06/2015   Midline low back pain without sciatica 07/07/2016   Overview:  --RTC for warning signs (Fevers, chills, loss of bowel/bladder function, saddle numbness), worsening pain or failure for pain to improve with symptomatic  treatment --reviewed natural course of disease that acute LBP takes 4-6 weeks to recover --cont meds listed for symptomatic relief --follow up in 2-4 weeks or earlier if warning signs develop   Mild CAD 04/06/2018   Mixed dyslipidemia 03/17/2019   Nerve pain 07/07/2016   Obesity    Obesity (BMI 30-39.9) 05/06/2015   OSA  (obstructive sleep apnea) 07/07/2016   Osteoarthritis 07/07/2016   Other ventral hernia 07/07/2016   Overweight 07/07/2016   Pacemaker 11/21/2014   PAF (paroxysmal atrial fibrillation) (HCC)    Paroxysmal atrial fibrillation ([place]) 11/27/2008   CHADS2 vasc=4    Presence of permanent cardiac pacemaker    09-24-13 01-21-14 Last interrogation in Epic at [facility]   Rhinosinusitis 07/07/2016   S/P total knee replacement 08/31/2021   Seasonal allergies 07/07/2016   Severe obesity (BMI 35.0-39.9) with comorbidity ([place]) 05/12/2018   Shortness of breath 04/11/2019   Sleep apnea    No cpap used   Smoker 07/07/2016   SSS (sick sinus syndrome) ([place]) 11/21/2014   Symptomatic menopausal or female climacteric states 07/07/2016   Thoracic back pain 07/07/2016   Type 2 diabetes mellitus with stage 2 chronic kidney disease, with long-term current use of insulin ([place]) 08/25/2015   Type II or unspecified type diabetes mellitus with renal manifestations, uncontrolled 07/07/2016   Past Surgical History:  Procedure Laterality Date   BREAST LUMPECTOMY WITH RADIOACTIVE SEED LOCALIZATION Right 08/02/2017   Procedure: BREAST LUMPECTOMY WITH RADIOACTIVE SEED LOCALIZATION;  Surgeon: Erroll Luna, MD;  Location: [place];  Service: General;  Laterality: Right;   CARPAL TUNNEL RELEASE     CATARACT EXTRACTION W/ INTRAOCULAR LENS IMPLANT Bilateral    CHOLECYSTECTOMY     HERNIA REPAIR     ORIF ANKLE FRACTURE Left 02/21/2014   Procedure: OPEN REDUCTION INTERNAL FIXATION (ORIF) LEFT  ANKLE FRACTURE;  Surgeon: Tobi Bastos, MD;  [facility];  Service: Orthopedics;  Laterality: Left;   PACEMAKER INSERTION  09/24/2013   TOTAL HIP ARTHROPLASTY Left    Bursa cut   TOTAL KNEE ARTHROPLASTY Right 08/31/2021   Procedure: TOTAL KNEE ARTHROPLASTY;  Surgeon: Vickey Huger, MD;  [facility];  Service: Orthopedics;  Laterality: Right;   TUBAL LIGATION       Current Outpatient Medications  Medication Sig Dispense Refill    acetaminophen (TYLENOL) 500 MG tablet Take 1,000 mg by mouth every 6 (six) hours as needed for mild pain.     albuterol (PROVENTIL HFA;VENTOLIN HFA) 108 (90 Base) MCG/ACT inhaler Inhale 2 puffs into the lungs every 6 (six) hours as needed for wheezing or shortness of breath.     apixaban (ELIQUIS) 5 MG TABS tablet Take 1 tablet (5 mg total) by mouth 2 (two) times daily. 180 tablet 3   atorvastatin (LIPITOR) 20 MG tablet Take 1 tablet (20 mg total) by mouth daily. 90 tablet 0   empagliflozin (JARDIANCE) 25 MG TABS tablet Take 25 mg by mouth daily.     escitalopram (LEXAPRO) 20 MG tablet Take 20 mg by mouth every other day.     finasteride (PROSCAR) 5 MG tablet Take 5 mg by mouth daily.     gabapentin (NEURONTIN) 300 MG capsule Take 300 mg by mouth 2 (two) times daily.     glucose blood test strip 1 strip by Misc.(Non-Drug; Combo Route) route 2 times daily.     Insulin Pen Needle 32G X 4 MM MISC Use pen needles as directed for diabetic medications     levothyroxine (SYNTHROID) 150  MCG tablet Take 150 mcg by mouth daily before breakfast.     metoprolol tartrate (LOPRESSOR) 50 MG tablet Take 50 mg by mouth 2 (two) times daily.     midodrine (PROAMATINE) 5 MG tablet TAKE 1 TABLET (5 MG TOTAL) BY MOUTH 3 (THREE) TIMES DAILY WITH MEALS. 270 tablet 2   omega-3 acid ethyl esters (LOVAZA) 1 g capsule TAKE 2 CAPSULES BY MOUTH TWICE A DAY 360 capsule 1   oxyCODONE (OXY IR/ROXICODONE) 5 MG immediate release tablet Take 1-2 tablets (5-10 mg total) by mouth every 6 (six) hours as needed for moderate pain (pain score 4-6). 40 tablet 0   pantoprazole (PROTONIX) 40 MG tablet Take 40 mg by mouth every morning.     Semaglutide, 2 MG/DOSE, (OZEMPIC, 2 MG/DOSE,) 8 MG/3ML SOPN Inject 2 mg into the skin once a week.     SYMBICORT 160-4.5 MCG/ACT inhaler Inhale 2 puffs into the lungs 2 (two) times daily as needed for shortness of breath.  1   TRESIBA FLEXTOUCH 100 UNIT/ML FlexTouch Pen Inject 80 Units into the skin at  bedtime.     Vitamin D, Ergocalciferol, (DRISDOL) 1.25 MG (50000 UT) CAPS capsule Take 50,000 Units by mouth every Sunday.     No current facility-administered medications for this visit.    Allergies:   Meperidine and Hydrocodone-acetaminophen   Social History:  The patient  reports that she quit smoking about 13 years ago. Her smoking use included cigarettes. She has been exposed to tobacco smoke. She has never used smokeless tobacco. She reports that she does not drink alcohol and does not use drugs.   Family History:  The patient's family history includes Dementia in her mother and sister; Diabetes in her brother and sister; Heart attack in her father.   ROS:  Please see the history of present illness.   Otherwise, review of systems is positive for none.   All other systems are reviewed and negative.   PHYSICAL EXAM: VS:  BP 114/68   Pulse 81   Ht 5' 3.5" (1.613 m)   Wt 193 lb (87.5 kg)   LMP 02/26/2005   SpO2 94%   BMI 33.65 kg/m  , BMI Body mass index is 33.65 kg/m. GEN: Well nourished, well developed, in no acute distress  HEENT: normal  Neck: no JVD, carotid bruits, or masses Cardiac: RRR; no murmurs, rubs, or gallops,no edema  Respiratory:  clear to auscultation bilaterally, normal work of breathing GI: soft, nontender, nondistended, + BS MS: no deformity or atrophy  Skin: warm and dry, device site well healed Neuro:  Strength and sensation are intact Psych: euthymic mood, full affect  EKG:  EKG is not ordered today. Personal review of the ekg ordered 11/12/21 shows atrial paced, rate 84  Personal review of the device interrogation today. Results in [place]: 08/26/2021: ALT 38; BUN 11; Creatinine, Ser 0.86; Hemoglobin 14.7; Platelets 210; Potassium 4.3; Sodium 140    Lipid Panel     Component Value Date/Time   CHOL 150 09/07/2018 0919   TRIG 427 (H) 09/07/2018 0919   HDL 31 (L) 09/07/2018 0919   CHOLHDL 4.8 (H) 09/07/2018 0919   LDLCALC Comment  09/07/2018 0919     Wt Readings from Last 3 Encounters:  11/30/21 193 lb (87.5 kg)  11/12/21 192 lb 6.4 oz (87.3 kg)  08/31/21 205 lb (93 kg)      Other studies Reviewed: Additional studies/ records that were reviewed today include: TTE 2017  Review  of the above records today demonstrates:  Normal left ventricular size and systolic function with no appreciable segmental abnormality. EF 55% There was no evidence of spontaneous echo contrast or thrombus in the left atrium or left atrial appendage. No evidence of patent foramen ovale by doppler evaluation .   ASSESSMENT AND PLAN:  1.  Paroxysmal atrial fibrillation: Currently on metoprolol 100 mg twice daily, Eliquis 5 mg twice daily.  CHA2DS2-VASc of 4.  Has had minimal episodes.  Continue with current management.  2.  Sick sinus syndrome: Status post Medtronic dual-chamber pacemaker.  Device function appropriate.  No changes.  3.  Hypertension: Currently well controlled on  4.  Diastolic heart failure: No obvious volume overload  5.  Hyperlipidemia: Continue atorvastatin 20 mg daily per primary cardiology and primary physician.  6.  Secondary hypercoagulable state: Currently on Eliquis for atrial fibrillation as above.  Current medicines are reviewed at length with the patient today.   The patient does not have concerns regarding her medicines.  The following changes were made today: none  Labs/ tests ordered today include:  No orders of the defined types were placed in this encounter.     Disposition:   FU with Tallia Moehring 1 year  Signed, Dari Carpenito Meredith Leeds, MD  11/30/2021 11:42 AM     [place] [address] [phone] (office) [phone] (fax)

## 2021-11-30 ENCOUNTER — Ambulatory Visit: Payer: PPO | Attending: Cardiology | Admitting: Cardiology

## 2021-11-30 ENCOUNTER — Encounter: Payer: Self-pay | Admitting: Cardiology

## 2021-11-30 VITALS — BP 114/68 | HR 81 | Ht 63.5 in | Wt 193.0 lb

## 2021-11-30 DIAGNOSIS — I495 Sick sinus syndrome: Secondary | ICD-10-CM

## 2021-11-30 DIAGNOSIS — I480 Paroxysmal atrial fibrillation: Secondary | ICD-10-CM | POA: Diagnosis not present

## 2021-11-30 DIAGNOSIS — D6869 Other thrombophilia: Secondary | ICD-10-CM

## 2021-11-30 LAB — CUP PACEART INCLINIC DEVICE CHECK
Battery Impedance: 532 Ohm
Battery Remaining Longevity: 98 mo
Battery Voltage: 2.78 V
Brady Statistic AP VP Percent: 1 %
Brady Statistic AP VS Percent: 20 %
Brady Statistic AS VP Percent: 0 %
Brady Statistic AS VS Percent: 79 %
Date Time Interrogation Session: 20231106132314
Implantable Lead Connection Status: 753985
Implantable Lead Connection Status: 753985
Implantable Lead Implant Date: 20150831
Implantable Lead Implant Date: 20150831
Implantable Lead Location: 753859
Implantable Lead Location: 753860
Implantable Lead Model: 4092
Implantable Lead Model: 5076
Implantable Pulse Generator Implant Date: 20150831
Lead Channel Impedance Value: 551 Ohm
Lead Channel Impedance Value: 562 Ohm
Lead Channel Pacing Threshold Amplitude: 0.625 V
Lead Channel Pacing Threshold Amplitude: 1.5 V
Lead Channel Pacing Threshold Amplitude: 2.25 V
Lead Channel Pacing Threshold Pulse Width: 0.4 ms
Lead Channel Pacing Threshold Pulse Width: 0.4 ms
Lead Channel Pacing Threshold Pulse Width: 0.4 ms
Lead Channel Sensing Intrinsic Amplitude: 0.5 mV
Lead Channel Sensing Intrinsic Amplitude: 5.6 mV
Lead Channel Setting Pacing Amplitude: 1.5 V
Lead Channel Setting Pacing Amplitude: 4.5 V
Lead Channel Setting Pacing Pulse Width: 0.4 ms
Lead Channel Setting Sensing Sensitivity: 2 mV
Zone Setting Status: 755011
Zone Setting Status: 755011

## 2021-11-30 NOTE — Patient Instructions (Signed)
Medication Instructions:  Your physician recommends that you continue on your current medications as directed. Please refer to the Current Medication list given to you today.  *If you need a refill on your cardiac medications before your next appointment, please call your pharmacy*   Lab Work: None ordered If you have labs (blood work) drawn today and your tests are completely normal, you will receive your results only by: [place] (if you have MyChart) OR A paper copy in the mail If you have any lab test that is abnormal or we need to change your treatment, we will call you to review the results.   Testing/Procedures: None ordered   Follow-Up: At [facility], you and your health needs are our priority.  As part of our continuing mission to provide you with exceptional heart care, we have created designated Provider Care Teams.  These Care Teams include your primary Cardiologist (physician) and Advanced Practice Providers (APPs -  Physician Assistants and Nurse Practitioners) who all work together to provide you with the care you need, when you need it.  Remote monitoring is used to monitor your Pacemaker or ICD from home. This monitoring reduces the number of office visits required to check your device to one time per year. It allows [place] to keep an eye on the functioning of your device to ensure it is working properly. You are scheduled for a device check from home on 01/20/2022. You may send your transmission at any time that day. If you have a wireless device, the transmission will be sent automatically. After your physician reviews your transmission, you will receive a postcard with your next transmission date.  Your next appointment:   1 year(s)  The format for your next appointment:   In Person  Provider:   Allegra Lai, MD    Thank you for choosing [facility]!!   Trinidad Curet, RN [phone]    Other Instructions  Important Information About  [place]

## 2021-12-26 ENCOUNTER — Other Ambulatory Visit: Payer: Self-pay | Admitting: Cardiology

## 2022-01-06 ENCOUNTER — Telehealth: Payer: Self-pay

## 2022-01-06 NOTE — Telephone Encounter (Signed)
Informed patient that a renewal application needs to be completed with [employer] for Eliquis assistance be 01/24/2022. Application left at [facility] office front desk for patient to complete and return.

## 2022-01-07 MED ORDER — APIXABAN 5 MG PO TABS: 5.0000 mg | ORAL_TABLET | Freq: Two times a day (BID) | ORAL | 3 refills | Status: DC

## 2022-01-07 NOTE — Addendum Note (Signed)
Addended by: Darius Bump B on: 01/07/2022 01:38 PM   Modules accepted: Orders

## 2022-01-07 NOTE — Telephone Encounter (Signed)
[place] patient assistance form completed by patient. Provider portion of application completed and Rx printed . Placed in folder for provider signature.

## 2022-01-11 NOTE — Telephone Encounter (Signed)
Application faxed

## 2022-01-20 ENCOUNTER — Ambulatory Visit (INDEPENDENT_AMBULATORY_CARE_PROVIDER_SITE_OTHER): Payer: PPO

## 2022-01-20 DIAGNOSIS — I495 Sick sinus syndrome: Secondary | ICD-10-CM

## 2022-01-21 ENCOUNTER — Telehealth: Payer: Self-pay

## 2022-01-21 LAB — CUP PACEART REMOTE DEVICE CHECK
Battery Impedance: 610 Ohm
Battery Remaining Longevity: 90 mo
Battery Voltage: 2.78 V
Brady Statistic AP VP Percent: 2 %
Brady Statistic AP VS Percent: 30 %
Brady Statistic AS VP Percent: 0 %
Brady Statistic AS VS Percent: 68 %
Date Time Interrogation Session: 20231228113100
Implantable Lead Connection Status: 753985
Implantable Lead Connection Status: 753985
Implantable Lead Implant Date: 20150831
Implantable Lead Implant Date: 20150831
Implantable Lead Location: 753859
Implantable Lead Location: 753860
Implantable Lead Model: 4092
Implantable Lead Model: 5076
Implantable Pulse Generator Implant Date: 20150831
Lead Channel Impedance Value: 541 Ohm
Lead Channel Impedance Value: 574 Ohm
Lead Channel Pacing Threshold Amplitude: 0.625 V
Lead Channel Pacing Threshold Amplitude: 2.25 V
Lead Channel Pacing Threshold Pulse Width: 0.4 ms
Lead Channel Pacing Threshold Pulse Width: 0.4 ms
Lead Channel Setting Pacing Amplitude: 1.5 V
Lead Channel Setting Pacing Amplitude: 4.5 V
Lead Channel Setting Pacing Pulse Width: 0.4 ms
Lead Channel Setting Sensing Sensitivity: 2.8 mV
Zone Setting Status: 755011
Zone Setting Status: 755011

## 2022-01-21 NOTE — Telephone Encounter (Signed)
Received the following scheduled remote notifications: Scheduled remote reviewed. Normal device function.   RV threshold has trended up, 2.25V @ 0.73m - route to triage 2 AHR's, AF, controlled rates, burden 1.2%, Eliquis Next remote 91 days. [place]  Do want to flag for Dr. CCurt Bearsthe following:  RV capture threshold recorded at 2.25V'@0'$ .440mcompared to 1.5V '@0'$ .70m43mn September.  This significant increase was noted on the lead trend beginning in October 2023. Impedance trend is stable Patient is not pacer dependent Paces only 2% in the ventricle System was implanted in 2015  Patient is not due for follow up in clinic until September 2024

## 2022-01-26 NOTE — Telephone Encounter (Signed)
Noted at in clinic check 11/2021.  Continue to monitor.

## 2022-02-09 NOTE — Progress Notes (Signed)
Remote pacemaker transmission.   

## 2022-02-21 ENCOUNTER — Other Ambulatory Visit: Payer: Self-pay | Admitting: Cardiology

## 2022-02-22 NOTE — Telephone Encounter (Signed)
Midodrine 5 mg tablets # 270 x 2 refills [id number] [place]

## 2022-02-23 ENCOUNTER — Other Ambulatory Visit: Payer: Self-pay | Admitting: Cardiology

## 2022-03-11 ENCOUNTER — Telehealth: Payer: Self-pay

## 2022-03-11 NOTE — Telephone Encounter (Signed)
Letter received from [place] patient assistance program that patient is not eligible to receive Eliquis due to out of pocket prescription expenses not met. Message left on voicemail regarding this with patient to contact Roosvelt Harps at [phone]

## 2022-04-21 ENCOUNTER — Ambulatory Visit (INDEPENDENT_AMBULATORY_CARE_PROVIDER_SITE_OTHER): Payer: PPO

## 2022-04-21 DIAGNOSIS — I495 Sick sinus syndrome: Secondary | ICD-10-CM | POA: Diagnosis not present

## 2022-04-21 LAB — CUP PACEART REMOTE DEVICE CHECK
Battery Impedance: 684 Ohm
Battery Remaining Longevity: 86 mo
Battery Voltage: 2.79 V
Brady Statistic AP VP Percent: 2 %
Brady Statistic AP VS Percent: 32 %
Brady Statistic AS VP Percent: 0 %
Brady Statistic AS VS Percent: 65 %
Date Time Interrogation Session: 20240327082007
Implantable Lead Connection Status: 753985
Implantable Lead Connection Status: 753985
Implantable Lead Implant Date: 20150831
Implantable Lead Implant Date: 20150831
Implantable Lead Location: 753859
Implantable Lead Location: 753860
Implantable Lead Model: 4092
Implantable Lead Model: 5076
Implantable Pulse Generator Implant Date: 20150831
Lead Channel Impedance Value: 574 Ohm
Lead Channel Impedance Value: 606 Ohm
Lead Channel Pacing Threshold Amplitude: 0.625 V
Lead Channel Pacing Threshold Amplitude: 2 V
Lead Channel Pacing Threshold Pulse Width: 0.4 ms
Lead Channel Pacing Threshold Pulse Width: 0.4 ms
Lead Channel Setting Pacing Amplitude: 1.5 V
Lead Channel Setting Pacing Amplitude: 4 V
Lead Channel Setting Pacing Pulse Width: 0.4 ms
Lead Channel Setting Sensing Sensitivity: 2.8 mV
Zone Setting Status: 755011
Zone Setting Status: 755011

## 2022-05-18 ENCOUNTER — Telehealth: Payer: Self-pay

## 2022-05-18 MED ORDER — APIXABAN 5 MG PO TABS: 5.0000 mg | ORAL_TABLET | Freq: Two times a day (BID) | ORAL | 3 refills | Status: DC

## 2022-05-18 NOTE — Telephone Encounter (Signed)
Carolyn [name] patient assistance application completed by patient with updated income and out of pocket information needed. New Rx for Eliquis printed and provider portion of application to Dr Dulce Sellar for signature.

## 2022-05-19 NOTE — Telephone Encounter (Signed)
[place] patient assistance application faxed

## 2022-05-20 ENCOUNTER — Other Ambulatory Visit: Payer: Self-pay | Admitting: Cardiology

## 2022-05-20 NOTE — Telephone Encounter (Signed)
Refill to pharmacy 

## 2022-05-31 NOTE — Progress Notes (Signed)
Remote pacemaker transmission.   

## 2022-07-21 ENCOUNTER — Ambulatory Visit (INDEPENDENT_AMBULATORY_CARE_PROVIDER_SITE_OTHER): Payer: PPO

## 2022-07-21 DIAGNOSIS — I495 Sick sinus syndrome: Secondary | ICD-10-CM

## 2022-07-21 LAB — CUP PACEART REMOTE DEVICE CHECK
Battery Impedance: 708 Ohm
Battery Remaining Longevity: 84 mo
Battery Voltage: 2.78 V
Brady Statistic AP VP Percent: 2 %
Brady Statistic AP VS Percent: 33 %
Brady Statistic AS VP Percent: 0 %
Brady Statistic AS VS Percent: 65 %
Date Time Interrogation Session: 20240626083418
Implantable Lead Connection Status: 753985
Implantable Lead Connection Status: 753985
Implantable Lead Implant Date: 20150831
Implantable Lead Implant Date: 20150831
Implantable Lead Location: 753859
Implantable Lead Location: 753860
Implantable Lead Model: 4092
Implantable Lead Model: 5076
Implantable Pulse Generator Implant Date: 20150831
Lead Channel Impedance Value: 606 Ohm
Lead Channel Impedance Value: 612 Ohm
Lead Channel Pacing Threshold Amplitude: 0.75 V
Lead Channel Pacing Threshold Amplitude: 1.25 V
Lead Channel Pacing Threshold Pulse Width: 0.4 ms
Lead Channel Pacing Threshold Pulse Width: 0.4 ms
Lead Channel Setting Pacing Amplitude: 1.625
Lead Channel Setting Pacing Amplitude: 2.5 V
Lead Channel Setting Pacing Pulse Width: 0.4 ms
Lead Channel Setting Sensing Sensitivity: 2.8 mV
Zone Setting Status: 755011
Zone Setting Status: 755011

## 2022-07-30 ENCOUNTER — Telehealth: Payer: Self-pay | Admitting: Cardiology

## 2022-07-30 NOTE — Telephone Encounter (Signed)
Patient called wanting to apply for patient assistance for her Eliquis. She states she applied a few month back but never heard anything back.

## 2022-07-30 NOTE — Telephone Encounter (Signed)
Advised that the foundation had a few questions when I called and states they will make a determination shortly and send a fax. Pt has been made aware.

## 2022-08-04 ENCOUNTER — Other Ambulatory Visit: Payer: Self-pay | Admitting: *Deleted

## 2022-08-04 MED ORDER — APIXABAN 5 MG PO TABS: 5.0000 mg | ORAL_TABLET | Freq: Two times a day (BID) | ORAL | 1 refills | Status: DC

## 2022-08-04 NOTE — Addendum Note (Signed)
Addended by: Louanna Raw on: 08/04/2022 11:49 AM   Modules accepted: Orders

## 2022-08-04 NOTE — Telephone Encounter (Signed)
Prescription refill request for Eliquis received. Indication: PAF Last office visit: 11/30/21  Carleene Mains MD Scr: 0.98 on 05/17/22  Epic Age: 72 Weight: 87.5kg  Based on above findings Eliquis 5mg  twice daily is the appropriate dose.  Refill approved.

## 2022-08-06 ENCOUNTER — Telehealth: Payer: Self-pay | Admitting: Cardiology

## 2022-08-06 NOTE — Telephone Encounter (Signed)
Pt c/o medication issue:  1. Name of Medication: apixaban (ELIQUIS) 5 MG TABS tablet   2. How are you currently taking this medication (dosage and times per day)? As prescribed   3. Are you having a reaction (difficulty breathing--STAT)? No   4. What is your medication issue? Patient had to pay $408 for prescription yesterday. She is wanting to know if the office has heard anything back about the assistance forms she resubmitted in April. Please advise.

## 2022-08-11 NOTE — Progress Notes (Signed)
 Remote pacemaker transmission.   

## 2022-08-11 NOTE — Telephone Encounter (Signed)
Pt is calling to check on status of her getting a callback from [facility] nurse regarding the forms she turned in for assistance with Eliquis back in April. Please advise

## 2022-08-12 NOTE — Telephone Encounter (Signed)
Called the patient and informed her of the message below from [facility] Patient Assistance:  "Called [facility] [name] patient Assistance and they stated that the patient had been denied to receive patient assistance. I asked if they would send a letter to the patient and they stated that once they are denied the first time they do not send out another denial letter. They stated that the patient would have to meet her out of pocket cost first, which is $151.95, then the medication would be covered."   Patient asked for their phone number and stated that she was going to call them. The phone number was given to the patient and she had no further questions at this time.

## 2022-08-12 NOTE — Telephone Encounter (Signed)
Called the patient and advised her that we had not received any communication from [facility] regarding the status of her patient assistance application. I informed her that we would reach out to [facility] to get an update on the status of her application. Patient was in agreement with this plan and had no further questions at this time.

## 2022-08-12 NOTE — Telephone Encounter (Signed)
Called [facility] patient Assistance and they stated that the patient had been denied to receive patient assistance. I asked if they would send a letter to the patient and they stated that once they are denied the first time they do not send out another denial letter. They stated that the patient would have to meet her out of pocket cost first, which is $151.95, then the medication would be covered.

## 2022-08-15 ENCOUNTER — Other Ambulatory Visit: Payer: Self-pay | Admitting: Cardiology

## 2022-08-20 NOTE — Telephone Encounter (Signed)
Patient has been approved for patient assistance for Eliquis from 08/20/22 through 01/25/23 after resubmitting an update out of pocket report.

## 2022-09-23 ENCOUNTER — Other Ambulatory Visit: Payer: Self-pay | Admitting: Cardiology

## 2022-10-20 ENCOUNTER — Ambulatory Visit: Payer: PPO

## 2022-10-20 DIAGNOSIS — I495 Sick sinus syndrome: Secondary | ICD-10-CM | POA: Diagnosis not present

## 2022-10-21 LAB — CUP PACEART REMOTE DEVICE CHECK
Battery Impedance: 787 Ohm
Battery Remaining Longevity: 67 mo
Battery Voltage: 2.77 V
Brady Statistic AP VP Percent: 2 %
Brady Statistic AP VS Percent: 33 %
Brady Statistic AS VP Percent: 0 %
Brady Statistic AS VS Percent: 65 %
Date Time Interrogation Session: 20240926102526
Implantable Lead Connection Status: 753985
Implantable Lead Connection Status: 753985
Implantable Lead Implant Date: 20150831
Implantable Lead Implant Date: 20150831
Implantable Lead Location: 753859
Implantable Lead Location: 753860
Implantable Lead Model: 4092
Implantable Lead Model: 5076
Implantable Pulse Generator Implant Date: 20150831
Lead Channel Impedance Value: 509 Ohm
Lead Channel Impedance Value: 598 Ohm
Lead Channel Pacing Threshold Amplitude: 0.5 V
Lead Channel Pacing Threshold Amplitude: 2.5 V
Lead Channel Pacing Threshold Pulse Width: 0.4 ms
Lead Channel Pacing Threshold Pulse Width: 0.4 ms
Lead Channel Setting Pacing Amplitude: 1.5 V
Lead Channel Setting Pacing Amplitude: 5 V
Lead Channel Setting Pacing Pulse Width: 0.76 ms
Lead Channel Setting Sensing Sensitivity: 2 mV
Zone Setting Status: 755011
Zone Setting Status: 755011

## 2022-10-26 IMAGING — CT CT ABD-PELV W/O CM
2 of 4 series · 17 of 46 positions shown, 19 images · non-contrast
Comparison: CT 11/01/2014

CLINICAL DATA: Diarrhea weakness

EXAM:
CT ABDOMEN AND PELVIS WITHOUT CONTRAST
TECHNIQUE: Multidetector CT imaging of the abdomen and pelvis was performed
following the standard protocol without IV contrast.

[Series 3: ap without · axial · non-contrast · 0.90mm/px · z∈[-433,-23]mm · 14 of 94 slices shown, 16 images]
[im 6/94  soft-tissue]
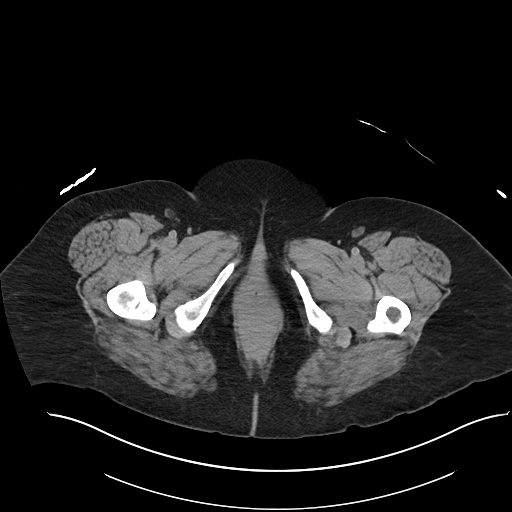
[im 6/94  bone]
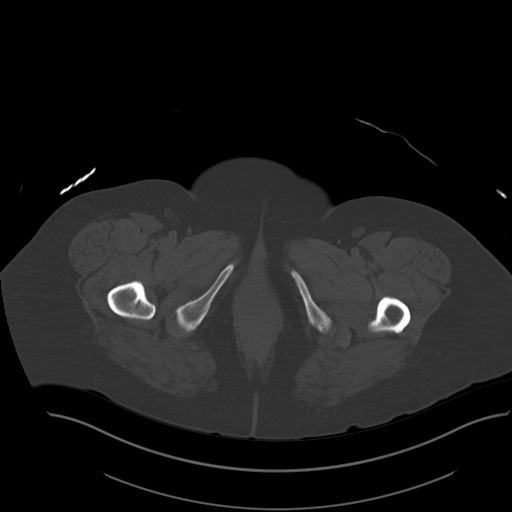
[im 11/94  soft-tissue]
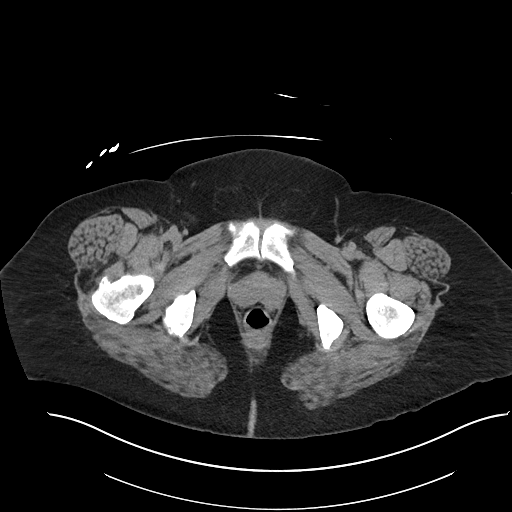
[im 21/94  soft-tissue]
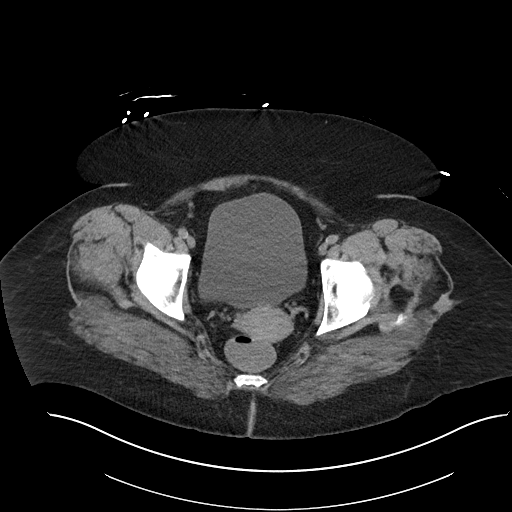
[im 26/94  soft-tissue]
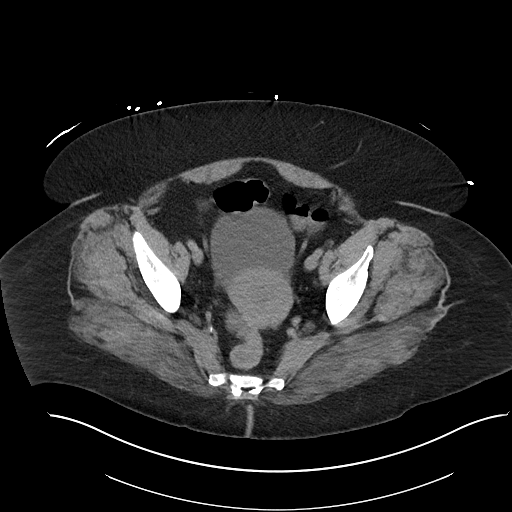
[im 32/94  soft-tissue]
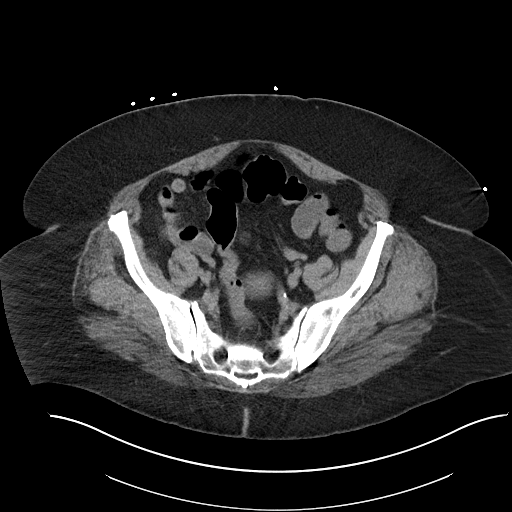
[im 37/94  soft-tissue]
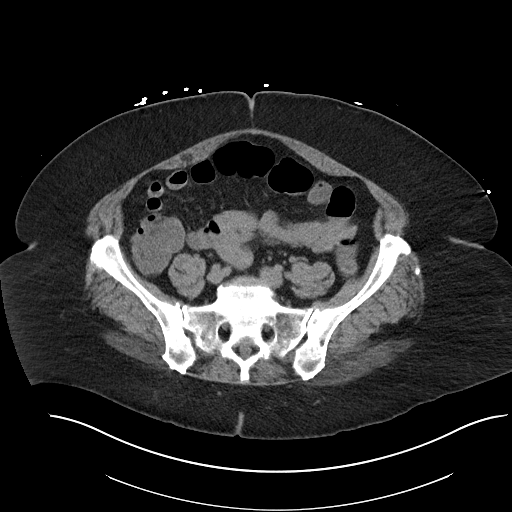
[im 42/94  soft-tissue]
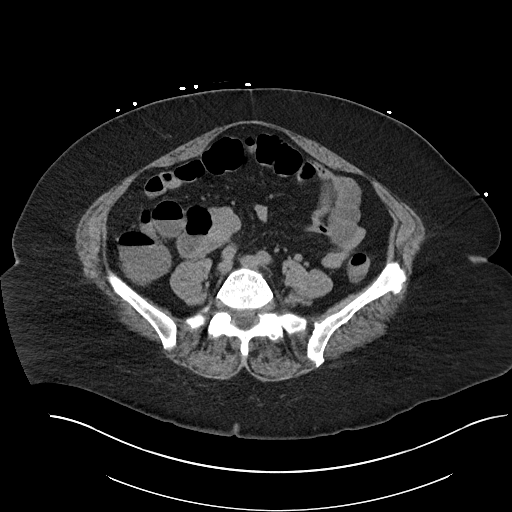
[im 52/94  soft-tissue]
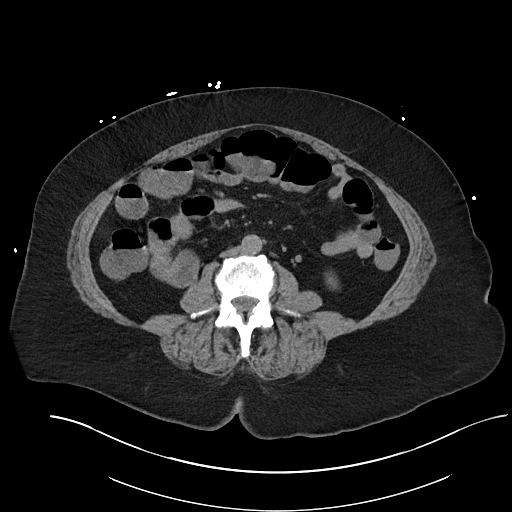
[im 57/94  soft-tissue]
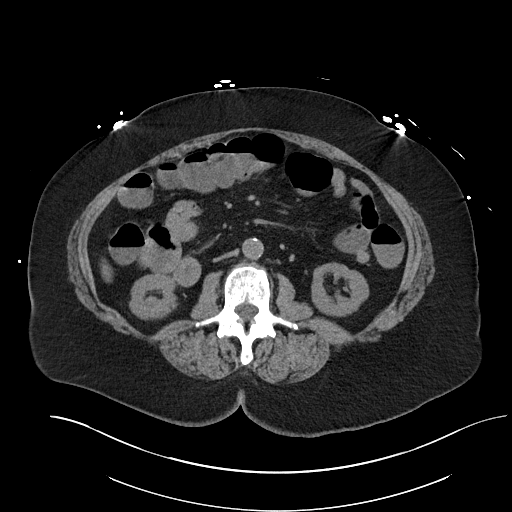
[im 57/94  bone]
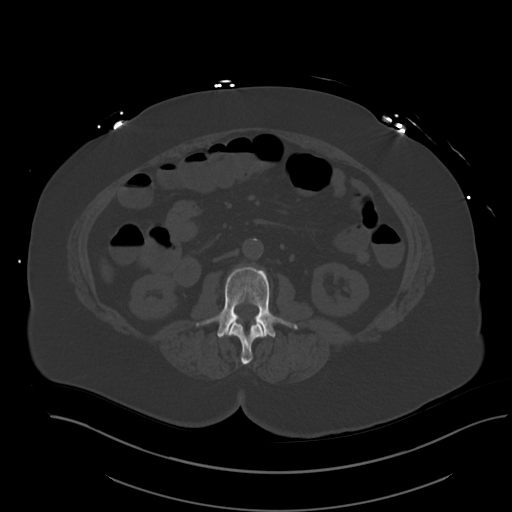
[im 63/94  soft-tissue]
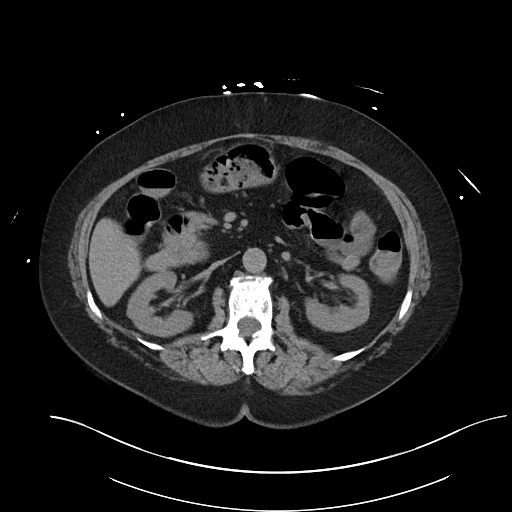
[im 68/94  soft-tissue]
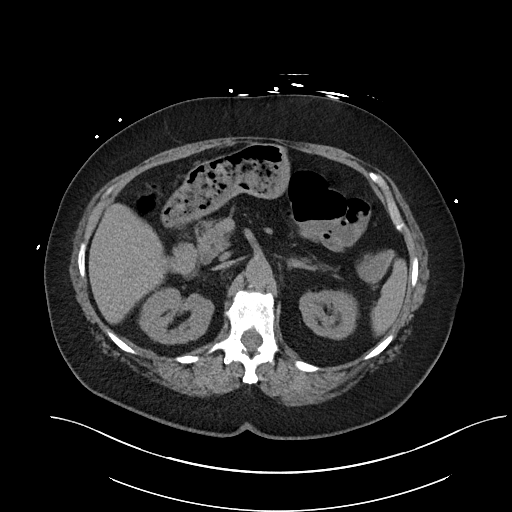
[im 73/94  soft-tissue]
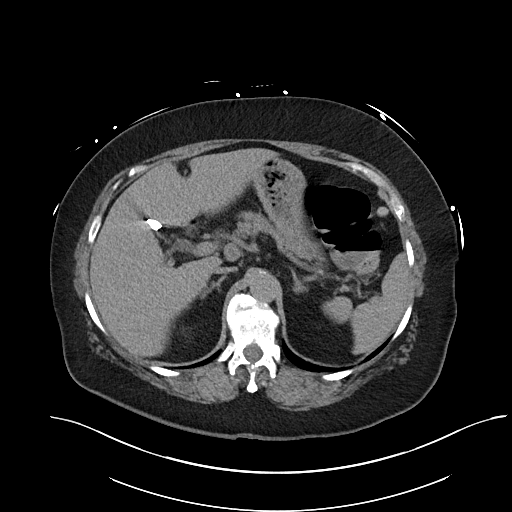
[im 83/94  soft-tissue]
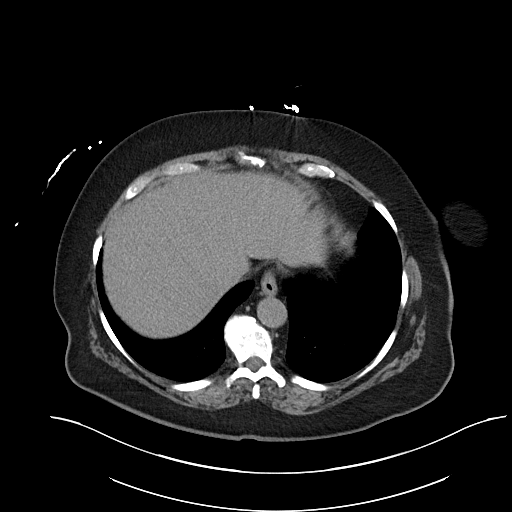
[im 88/94  soft-tissue]
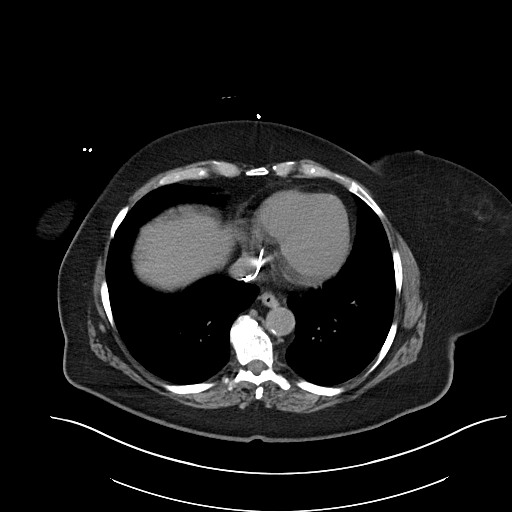

[Series 6: cor · coronal · 0.93mm/px · 3 of 109 slices shown]
[im 37/109  soft-tissue]
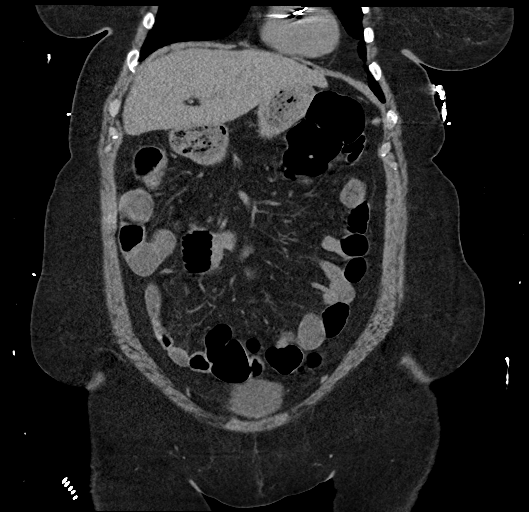
[im 49/109  soft-tissue]
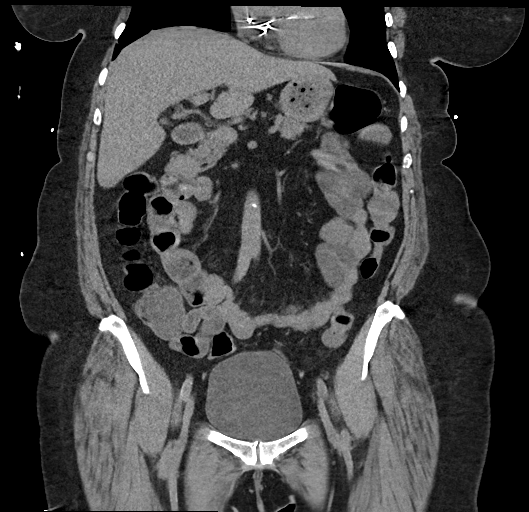
[im 61/109  soft-tissue]
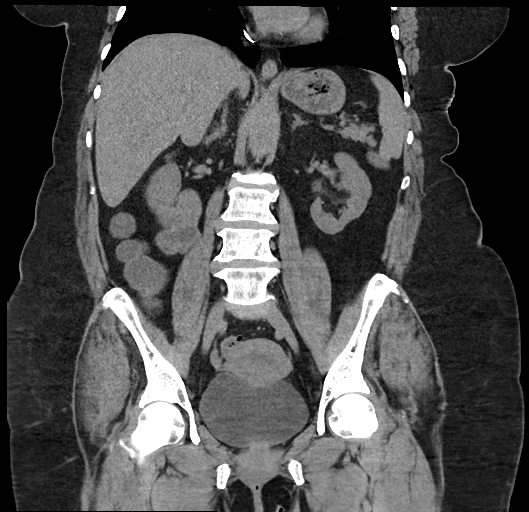

[17 of 46 positions shown; findings below may reference images not displayed]

FINDINGS: Lower chest: Lung bases demonstrate linear scarring or atelectasis
at the right base. No acute consolidation or effusion. Partially
visualized intracardiac pacing leads.

Hepatobiliary: Status post cholecystectomy. Similar slight
prominence of extrahepatic common bile duct likely due to
postsurgical change.

Pancreas: Unremarkable. No pancreatic ductal dilatation or
surrounding inflammatory changes.

Spleen: Normal in size without focal abnormality.

Adrenals/Urinary Tract: Adrenal glands are unremarkable. Kidneys are
normal, without renal calculi, focal lesion, or hydronephrosis.
Bladder is unremarkable.

Stomach/Bowel: Stomach nonenlarged. Scattered fluid-filled small
bowel without dilatation. Fluid within the colon. No acute bowel
wall thickening. Nonvisualized appendix

Vascular/Lymphatic: Mild aortic atherosclerosis. No aneurysm. No
suspicious nodes

Reproductive: No adnexal mass possible endometrial stripe thickening
up to 1 cm.

Other: Negative for pelvic effusion or free air

Musculoskeletal: No acute or significant osseous findings.
IMPRESSION: 1. No CT evidence for acute intra-abdominal or pelvic abnormality.
Fluid in the colon consistent with diarrheal illness. No wall
thickening to suggest colitis.
2. Possible endometrial stripe thickening up to 1 cm. Consider
correlation with nonemergent ultrasound

## 2022-11-05 NOTE — Progress Notes (Signed)
Remote pacemaker transmission.   

## 2022-11-10 ENCOUNTER — Ambulatory Visit: Payer: PPO | Admitting: Podiatry

## 2022-11-13 ENCOUNTER — Other Ambulatory Visit: Payer: Self-pay | Admitting: Cardiology

## 2022-11-16 ENCOUNTER — Other Ambulatory Visit: Payer: Self-pay | Admitting: Cardiology

## 2022-11-17 ENCOUNTER — Other Ambulatory Visit: Payer: Self-pay | Admitting: Cardiology

## 2022-11-24 ENCOUNTER — Ambulatory Visit: Payer: PPO | Admitting: Podiatry

## 2022-11-25 ENCOUNTER — Ambulatory Visit: Payer: PPO | Admitting: Cardiology

## 2022-11-26 ENCOUNTER — Ambulatory Visit: Payer: PPO | Attending: Cardiology | Admitting: Cardiology

## 2022-11-26 ENCOUNTER — Encounter: Payer: Self-pay | Admitting: Cardiology

## 2022-11-26 VITALS — BP 102/60 | HR 69 | Ht 63.5 in | Wt 193.8 lb

## 2022-11-26 DIAGNOSIS — I5032 Chronic diastolic (congestive) heart failure: Secondary | ICD-10-CM

## 2022-11-26 DIAGNOSIS — I480 Paroxysmal atrial fibrillation: Secondary | ICD-10-CM

## 2022-11-26 DIAGNOSIS — N1831 Chronic kidney disease, stage 3a: Secondary | ICD-10-CM

## 2022-11-26 DIAGNOSIS — Z7901 Long term (current) use of anticoagulants: Secondary | ICD-10-CM

## 2022-11-26 DIAGNOSIS — I495 Sick sinus syndrome: Secondary | ICD-10-CM | POA: Diagnosis not present

## 2022-11-26 DIAGNOSIS — Z950 Presence of cardiac pacemaker: Secondary | ICD-10-CM

## 2022-11-26 DIAGNOSIS — I110 Hypertensive heart disease with heart failure: Secondary | ICD-10-CM

## 2022-11-26 DIAGNOSIS — I951 Orthostatic hypotension: Secondary | ICD-10-CM

## 2022-11-26 NOTE — Patient Instructions (Signed)
Medication Instructions:  Your physician recommends that you continue on your current medications as directed. Please refer to the Current Medication list given to you today.  *If you need a refill on your cardiac medications before your next appointment, please call your pharmacy*   Lab Work: Your physician recommends that you return for lab work in:   Labs today: CMP, Lipids  If you have labs (blood work) drawn today and your tests are completely normal, you will receive your results only by: MyChart Message (if you have MyChart) OR A paper copy in the mail If you have any lab test that is abnormal or we need to change your treatment, we will call you to review the results.   Testing/Procedures: None   Follow-Up: At [facility], you and your health needs are our priority.  As part of our continuing mission to provide you with exceptional heart care, we have created designated Provider Care Teams.  These Care Teams include your primary Cardiologist (physician) and Advanced Practice Providers (APPs -  Physician Assistants and Nurse Practitioners) who all work together to provide you with the care you need, when you need it.  We recommend signing up for the patient portal called "MyChart".  Sign up information is provided on this After Visit Summary.  MyChart is used to connect with patients for Virtual Visits (Telemedicine).  Patients are able to view lab/test results, encounter notes, upcoming appointments, etc.  Non-urgent messages can be sent to your provider as well.   To learn more about what you can do with MyChart, go to [email or web address].    Your next appointment:   1 year(s)  Provider:   Brian Munley, MD    Other Instructions None  

## 2022-11-27 LAB — LIPID PANEL
Chol/HDL Ratio: 3.7 ratio (ref 0.0–4.4)
Cholesterol, Total: 136 mg/dL (ref 100–199)
HDL: 37 mg/dL — ABNORMAL LOW
LDL Chol Calc (NIH): 69 mg/dL (ref 0–99)
Triglycerides: 175 mg/dL — ABNORMAL HIGH (ref 0–149)
VLDL Cholesterol Cal: 30 mg/dL (ref 5–40)

## 2022-11-27 LAB — COMPREHENSIVE METABOLIC PANEL
ALT: 64 [IU]/L — ABNORMAL HIGH (ref 0–32)
AST: 34 [IU]/L (ref 0–40)
Albumin: 4.3 g/dL (ref 3.9–4.9)
Alkaline Phosphatase: 83 [IU]/L (ref 44–121)
BUN/Creatinine Ratio: 15 (ref 12–28)
BUN: 15 mg/dL (ref 8–27)
Bilirubin Total: 2.7 mg/dL — ABNORMAL HIGH (ref 0.0–1.2)
CO2: 26 mmol/L (ref 20–29)
Calcium: 9.5 mg/dL (ref 8.7–10.3)
Chloride: 106 mmol/L (ref 96–106)
Creatinine, Ser: 1.03 mg/dL — ABNORMAL HIGH (ref 0.57–1.00)
Globulin, Total: 2.1 g/dL (ref 1.5–4.5)
Glucose: 94 mg/dL (ref 70–99)
Potassium: 4.3 mmol/L (ref 3.5–5.2)
Sodium: 144 mmol/L (ref 134–144)
Total Protein: 6.4 g/dL (ref 6.0–8.5)
eGFR: 58 mL/min/{1.73_m2} — ABNORMAL LOW (ref >59)

## 2022-11-29 ENCOUNTER — Other Ambulatory Visit: Payer: Self-pay

## 2022-11-29 DIAGNOSIS — I2510 Atherosclerotic heart disease of native coronary artery without angina pectoris: Secondary | ICD-10-CM

## 2022-11-29 DIAGNOSIS — J40 Bronchitis, not specified as acute or chronic: Secondary | ICD-10-CM

## 2022-12-02 ENCOUNTER — Ambulatory Visit: Payer: PPO | Admitting: Podiatry

## 2022-12-02 DIAGNOSIS — S91312A Laceration without foreign body, left foot, initial encounter: Secondary | ICD-10-CM

## 2022-12-02 DIAGNOSIS — Q828 Other specified congenital malformations of skin: Secondary | ICD-10-CM | POA: Diagnosis not present

## 2022-12-02 NOTE — Progress Notes (Signed)
Subjective:  Patient ID: Carolyn [name], female    DOB: August 29, 1950,  MRN: [id number]  Carolyn [name] presents to clinic today for:  Chief Complaint  Patient presents with   Foot Pain    Left foot lesion, - months it has been there causing pain, no injury and dont think that she stepped on anything, Diabetic, blood thinner   PCP is Hadley Pen, MD.  Past Medical History:  Diagnosis Date   Abnormal mammogram 05/10/2017   Acquired hypothyroidism 07/07/2016   Acute gastritis 07/07/2016   Acute pain of right knee 09/20/2019   AKI (acute kidney injury) (HCC) 07/25/2020   Allergic rhinitis 07/07/2016   Anemia 07/07/2016   Anxiety 07/07/2016   Arthritis    Osteoarthritis   ATRIAL FIBRILLATION 11/27/2008   Qualifier: Diagnosis of  By: Abner Greenspan PharmD, Sally     Atypical chest pain 07/07/2016   Benign essential hypertension 07/07/2016   Overview:  stable on current meds   Benign paroxysmal positional vertigo 07/07/2016   Overview:  Avoid rapid headmovement and position change. May taper the gabapentin to see if helps.   Bilateral low back pain without sciatica 07/07/2016   Overview:  continue gabapentin and meloxicam,   Bimalleolar ankle fracture 02/21/2014   Bronchitis 07/07/2016   Carpal tunnel syndrome    CHF (congestive heart failure) (HCC)    08-2013   Chronic anticoagulation 12/03/2014   Chronic diastolic heart failure (HCC) 12/03/2014   CKD (chronic kidney disease) stage 3, GFR 30-59 ml/min (HCC) 11/21/2014   COPD (chronic obstructive pulmonary disease) (HCC) 07/07/2016   Diarrhea due to malabsorption 08/17/2017   DM2 (diabetes mellitus, type 2) (HCC)    Edema, peripheral 07/07/2016   Overview:  increase lasix to 40 mg bid   Encounter for annual physical examination excluding gynecological examination in a patient older than 17 years 03/17/2019   Enthesopathy 07/07/2016   Esophagitis, reflux 07/07/2016   Essential hypertension 05/06/2015   Functional diarrhea 07/07/2016   GERD  (gastroesophageal reflux disease)    Great toe pain, left 05/25/2018   Headache 07/07/2016   Heart failure (HCC)    History of diverticulosis 07/07/2016   HLD (hyperlipidemia)    HTN (hypertension)    Hyperlipidemia 05/06/2015   Hypertensive heart disease 07/07/2016   Overview:  stable on current meds   Hypertensive heart disease with heart failure (HCC) 12/03/2014   Hypothyroid 07/07/2016   Hypothyroidism    Irritable bowel syndrome 07/07/2016   Overview:  IBS information sheet provided,  dietary changes suggested   Low back pain 07/07/2016   Overview:  lumbar spine films at [facility]   Major depressive disorder, recurrent, moderate (HCC) 07/07/2016   Microalbuminuria due to type 2 diabetes mellitus (HCC) 05/06/2015   Midline low back pain without sciatica 07/07/2016   Overview:  --RTC for warning signs (Fevers, chills, loss of bowel/bladder function, saddle numbness), worsening pain or failure for pain to improve with symptomatic treatment --reviewed natural course of disease that acute LBP takes 4-6 weeks to recover --cont meds listed for symptomatic relief --follow up in 2-4 weeks or earlier if warning signs develop   Mild CAD 04/06/2018   Mixed dyslipidemia 03/17/2019   Nerve pain 07/07/2016   Obesity    Obesity (BMI 30-39.9) 05/06/2015   OSA (obstructive sleep apnea) 07/07/2016   Osteoarthritis 07/07/2016   Other ventral hernia 07/07/2016   Overweight 07/07/2016   Pacemaker 11/21/2014   PAF (paroxysmal atrial fibrillation) (HCC)    Paroxysmal atrial fibrillation (HCC)  11/27/2008   CHADS2 vasc=4    Presence of permanent cardiac pacemaker    09-24-13 01-21-14 Last interrogation in Epic at [facility]   Rhinosinusitis 07/07/2016   S/P total knee replacement 08/31/2021   Seasonal allergies 07/07/2016   Severe obesity (BMI 35.0-39.9) with comorbidity (HCC) 05/12/2018   Shortness of breath 04/11/2019   Sleep apnea    No cpap used   Smoker 07/07/2016   SSS (sick sinus syndrome) (HCC) 11/21/2014    Symptomatic menopausal or female climacteric states 07/07/2016   Thoracic back pain 07/07/2016   Type 2 diabetes mellitus with stage 2 chronic kidney disease, with long-term current use of insulin (HCC) 08/25/2015   Type II or unspecified type diabetes mellitus with renal manifestations, uncontrolled 07/07/2016    Past Surgical History:  Procedure Laterality Date   BREAST LUMPECTOMY WITH RADIOACTIVE SEED LOCALIZATION Right 08/02/2017   Procedure: BREAST LUMPECTOMY WITH RADIOACTIVE SEED LOCALIZATION;  Surgeon: Harriette Bouillon, MD;  Location: [facility] OR;  Service: General;  Laterality: Right;   CARPAL TUNNEL RELEASE     CATARACT EXTRACTION W/ INTRAOCULAR LENS IMPLANT Bilateral    CHOLECYSTECTOMY     HERNIA REPAIR     ORIF ANKLE FRACTURE Left 02/21/2014   Procedure: OPEN REDUCTION INTERNAL FIXATION (ORIF) LEFT  ANKLE FRACTURE;  Surgeon: Jacki Cones, MD;  Location: [facility] ORS;  Service: Orthopedics;  Laterality: Left;   PACEMAKER INSERTION  09/24/2013   TOTAL HIP ARTHROPLASTY Left    Bursa cut   TOTAL KNEE ARTHROPLASTY Right 08/31/2021   Procedure: TOTAL KNEE ARTHROPLASTY;  Surgeon: Dannielle Huh, MD;  Location: [facility] ORS;  Service: Orthopedics;  Laterality: Right;   TUBAL LIGATION      Allergies  Allergen Reactions   Meperidine Nausea And Vomiting and Other (See Comments)    Was given patch behind her ear and was better   Hydrocodone-Acetaminophen Other (See Comments)    REACTION: Patient states she doesnt break in hives or anything. Just feels bad when she takes it.   Objective:  Carolyn [name] is a pleasant 72 y.o. female in NAD. AAO x 3.  Vascular Examination: Capillary refill time is 3-5 seconds to toes bilateral. Palpable pedal pulses b/l LE. Digital hair present b/l. No pedal edema b/l. Skin temperature gradient WNL b/l. No varicosities b/l. No cyanosis or clubbing noted b/l.   Dermatological Examination: Pedal skin with normal turgor, texture and tone b/l. No open wounds. No  interdigital macerations b/l.   Hyperkeratotic lesion present, with pain on palpation, located left submet 2.  There is a small skin tear on the submet 1 area, on the left foot as well.  This is superficial with dried blood present.  No clinical signs of infection are noted   Assessment/Plan: 1. Porokeratosis   2. Tear of skin of plantar aspect of left foot, initial encounter    The hyperkeratotic lesion was sharply debrided/shaved with sterile #313 blade.  No evidence of foreign [name] was noted during debridement.  Salinocaine and a Band-Aid was applied to the lesion post shaving.  She can remove this at bedtime.  The skin tear on the plantar aspect of the left foot near submet 1 was treated with antibiotic ointment and a Band-Aid.  She can remove this later today as well.  Discussed with the patient what is causing the corns/calluses and reviewed treatment options today, including shaving the the painful lesion(s), off-loading techniques and pads, custom orthotics / shoe modifications.    Return if symptoms worsen or fail  to improve.   Clerance Lav, DPM, FACFAS [facility]     [address] [place], [place] [address]                Office [phone]  Fax [phone]

## 2022-12-03 ENCOUNTER — Ambulatory Visit (HOSPITAL_BASED_OUTPATIENT_CLINIC_OR_DEPARTMENT_OTHER)
Admission: RE | Admit: 2022-12-03 | Discharge: 2022-12-03 | Disposition: A | Discharge status: Home / Self Care | Payer: PPO | Source: Ambulatory Visit | Attending: Cardiology | Admitting: Cardiology

## 2022-12-03 DIAGNOSIS — I2510 Atherosclerotic heart disease of native coronary artery without angina pectoris: Secondary | ICD-10-CM | POA: Diagnosis present

## 2022-12-07 ENCOUNTER — Other Ambulatory Visit: Payer: Self-pay

## 2022-12-07 DIAGNOSIS — R7989 Other specified abnormal findings of blood chemistry: Secondary | ICD-10-CM

## 2022-12-10 ENCOUNTER — Encounter (HOSPITAL_BASED_OUTPATIENT_CLINIC_OR_DEPARTMENT_OTHER): Payer: Self-pay | Admitting: Family Medicine

## 2023-01-03 ENCOUNTER — Ambulatory Visit: Payer: PPO | Attending: Cardiology | Admitting: Cardiology

## 2023-01-03 ENCOUNTER — Encounter: Payer: Self-pay | Admitting: Cardiology

## 2023-01-03 VITALS — BP 120/78 | HR 84 | Ht 63.5 in | Wt 199.1 lb

## 2023-01-03 DIAGNOSIS — I480 Paroxysmal atrial fibrillation: Secondary | ICD-10-CM

## 2023-01-03 DIAGNOSIS — D6869 Other thrombophilia: Secondary | ICD-10-CM | POA: Diagnosis not present

## 2023-01-03 DIAGNOSIS — I495 Sick sinus syndrome: Secondary | ICD-10-CM | POA: Diagnosis not present

## 2023-01-03 DIAGNOSIS — I10 Essential (primary) hypertension: Secondary | ICD-10-CM | POA: Diagnosis not present

## 2023-01-03 MED ORDER — DILTIAZEM HCL 30 MG PO TABS: 30.0000 mg | ORAL_TABLET | Freq: Four times a day (QID) | ORAL | 2 refills | Status: AC | PRN

## 2023-01-03 NOTE — Progress Notes (Signed)
  Electrophysiology Office Note:   Date:  01/03/2023  ID:  Carolyn [name], DOB 1950-04-23, MRN [id number]  Primary Cardiologist: Carolyn Herrlich, MD Electrophysiologist: Carolyn Lemming, MD      History of Present Illness:   Carolyn [name] is a 72 y.o. female with h/o paroxysmal atrial fibrillation, hypertension, diastolic heart failure, sick sinus syndrome seen today for routine electrophysiology followup.   Since last being seen in our clinic the patient reports doing overall well.  She has no chest pain or shortness of breath.  She has had intermittent event associated with atrial fibrillation.  She has a 1.5% burden based on her pacemaker.  When she is not in atrial fibrillation, she has no complaints and has felt well.  she denies chest pain, palpitations, dyspnea, PND, orthopnea, nausea, vomiting, dizziness, syncope, edema, weight gain, or early satiety.   Review of systems complete and found to be negative unless listed in HPI.      EP Information / Studies Reviewed:    EKG is not ordered today. EKG from 01/25/2022 reviewed which showed sinus rhythm, first-degree AV block      PPM Interrogation-  reviewed in detail today,  See PACEART report.  Device History: Medtronic Dual Chamber PPM implanted for Sinus Node Dysfunction  Risk Assessment/Calculations:    CHA2DS2-VASc Score = 4   This indicates a 4.8% annual risk of stroke. The patient's score is based upon: CHF History: 0 HTN History: 1 Diabetes History: 1 Stroke History: 0 Vascular Disease History: 0 Age Score: 1 Gender Score: 1             Physical Exam:   VS:  BP 120/78   Pulse 84   Ht 5' 3.5" (1.613 m)   Wt 199 lb 1.9 oz (90.3 kg)   LMP 02/26/2005   SpO2 94%   BMI 34.72 kg/m    Wt Readings from Last 3 Encounters:  01/03/23 199 lb 1.9 oz (90.3 kg)  11/26/22 193 lb 12.8 oz (87.9 kg)  11/30/21 193 lb (87.5 kg)     GEN: Well nourished, well developed in no acute distress NECK: No JVD; No carotid  bruits CARDIAC: Regular rate and rhythm, no murmurs, rubs, gallops RESPIRATORY:  Clear to auscultation without rales, wheezing or rhonchi  ABDOMEN: Soft, non-tender, non-distended EXTREMITIES:  No edema; No deformity   ASSESSMENT AND PLAN:    SND s/p Medtronic PPM  Normal PPM function See Pace Art report No changes today  2.  Paroxysmal atrial fibrillation: Currently on metoprolol.  She has minimal episodes of atrial fibrillation, though she is symptomatic.  Carolyn [name] give 30 mg diltiazem to take as needed.  If she continues to have episodes and is symptomatic, we Carolyn [name] plan for rhythm control.  3.  Hypertension: Currently well-controlled  4.  Secondary hypercoagulable state: Currently on Eliquis for atrial fibrillation  Disposition:   Follow up with Dr. Elberta [name] in 12 months  Signed, Carolyn Thibeau Jorja Loa, MD

## 2023-01-03 NOTE — Patient Instructions (Addendum)
Medication Instructions:  Your physician has recommended you make the following change in your medication:  START Diltiazem 30 mg every 6 hours as needed for afib  *If you need a refill on your cardiac medications before your next appointment, please call your pharmacy*   Lab Work: None ordered   Testing/Procedures: None ordered   Follow-Up: At [facility], you and your health needs are our priority.  As part of our continuing mission to provide you with exceptional heart care, we have created designated Provider Care Teams.  These Care Teams include your primary Cardiologist (physician) and Advanced Practice Providers (APPs -  Physician Assistants and Nurse Practitioners) who all work together to provide you with the care you need, when you need it.  Your next appointment:   1 year(s)  The format for your next appointment:   In Person  Provider:   Loman Brooklyn, MD{   Thank you for choosing [facility]!!   Dory Horn, RN [phone]  Other Instructions

## 2023-01-06 LAB — CUP PACEART INCLINIC DEVICE CHECK
Battery Impedance: 890 Ohm
Battery Remaining Longevity: 77 mo
Battery Voltage: 2.78 V
Brady Statistic AP VP Percent: 2 %
Brady Statistic AP VS Percent: 33 %
Brady Statistic AS VP Percent: 0 %
Brady Statistic AS VS Percent: 65 %
Date Time Interrogation Session: 20241209164700
Implantable Lead Connection Status: 753985
Implantable Lead Connection Status: 753985
Implantable Lead Implant Date: 20150831
Implantable Lead Implant Date: 20150831
Implantable Lead Location: 753859
Implantable Lead Location: 753860
Implantable Lead Model: 4092
Implantable Lead Model: 5076
Implantable Pulse Generator Implant Date: 20150831
Lead Channel Impedance Value: 526 Ohm
Lead Channel Impedance Value: 628 Ohm
Lead Channel Pacing Threshold Amplitude: 0.625 V
Lead Channel Pacing Threshold Amplitude: 0.75 V
Lead Channel Pacing Threshold Amplitude: 1.5 V
Lead Channel Pacing Threshold Amplitude: 1.625 V
Lead Channel Pacing Threshold Pulse Width: 0.4 ms
Lead Channel Pacing Threshold Pulse Width: 0.4 ms
Lead Channel Pacing Threshold Pulse Width: 0.4 ms
Lead Channel Pacing Threshold Pulse Width: 0.4 ms
Lead Channel Sensing Intrinsic Amplitude: 2 mV
Lead Channel Sensing Intrinsic Amplitude: 5.6 mV
Lead Channel Setting Pacing Amplitude: 1.5 V
Lead Channel Setting Pacing Amplitude: 3.25 V
Lead Channel Setting Pacing Pulse Width: 0.4 ms
Lead Channel Setting Sensing Sensitivity: 2.8 mV
Zone Setting Status: 755011
Zone Setting Status: 755011

## 2023-01-08 ENCOUNTER — Other Ambulatory Visit: Payer: Self-pay | Admitting: Cardiology

## 2023-01-20 ENCOUNTER — Ambulatory Visit (INDEPENDENT_AMBULATORY_CARE_PROVIDER_SITE_OTHER): Payer: PPO

## 2023-01-20 DIAGNOSIS — I495 Sick sinus syndrome: Secondary | ICD-10-CM | POA: Diagnosis not present

## 2023-01-24 LAB — CUP PACEART REMOTE DEVICE CHECK
Battery Impedance: 890 Ohm
Battery Remaining Longevity: 78 mo
Battery Voltage: 2.78 V
Brady Statistic AP VP Percent: 1 %
Brady Statistic AP VS Percent: 35 %
Brady Statistic AS VP Percent: 0 %
Brady Statistic AS VS Percent: 63 %
Date Time Interrogation Session: 20241228082110
Implantable Lead Connection Status: 753985
Implantable Lead Connection Status: 753985
Implantable Lead Implant Date: 20150831
Implantable Lead Implant Date: 20150831
Implantable Lead Location: 753859
Implantable Lead Location: 753860
Implantable Lead Model: 4092
Implantable Lead Model: 5076
Implantable Pulse Generator Implant Date: 20150831
Lead Channel Impedance Value: 525 Ohm
Lead Channel Impedance Value: 594 Ohm
Lead Channel Pacing Threshold Amplitude: 0.625 V
Lead Channel Pacing Threshold Amplitude: 1.625 V
Lead Channel Pacing Threshold Pulse Width: 0.4 ms
Lead Channel Pacing Threshold Pulse Width: 0.4 ms
Lead Channel Setting Pacing Amplitude: 1.5 V
Lead Channel Setting Pacing Amplitude: 3.25 V
Lead Channel Setting Pacing Pulse Width: 0.4 ms
Lead Channel Setting Sensing Sensitivity: 2 mV
Zone Setting Status: 755011
Zone Setting Status: 755011

## 2023-02-11 ENCOUNTER — Other Ambulatory Visit: Payer: Self-pay | Admitting: Cardiology

## 2023-03-20 ENCOUNTER — Ambulatory Visit (HOSPITAL_BASED_OUTPATIENT_CLINIC_OR_DEPARTMENT_OTHER)
Admission: EM | Admit: 2023-03-20 | Discharge: 2023-03-20 | Disposition: A | Discharge status: Home / Self Care | Payer: PPO | Attending: Family Medicine | Admitting: Family Medicine

## 2023-03-20 ENCOUNTER — Encounter (HOSPITAL_BASED_OUTPATIENT_CLINIC_OR_DEPARTMENT_OTHER): Payer: Self-pay | Admitting: Emergency Medicine

## 2023-03-20 DIAGNOSIS — J208 Acute bronchitis due to other specified organisms: Secondary | ICD-10-CM | POA: Diagnosis not present

## 2023-03-20 DIAGNOSIS — R051 Acute cough: Secondary | ICD-10-CM | POA: Diagnosis not present

## 2023-03-20 LAB — POC COVID19/FLU A&B COMBO
Covid Antigen, POC: NEGATIVE
Influenza A Antigen, POC: NEGATIVE
Influenza B Antigen, POC: NEGATIVE

## 2023-03-20 MED ORDER — PROMETHAZINE-DM 6.25-15 MG/5ML PO SYRP: 5.0000 mL | ORAL_SOLUTION | Freq: Four times a day (QID) | ORAL | 0 refills | Status: DC | PRN

## 2023-03-20 MED ORDER — ALBUTEROL SULFATE HFA 108 (90 BASE) MCG/ACT IN AERS
2.0000 | INHALATION_SPRAY | Freq: Four times a day (QID) | RESPIRATORY_TRACT | 0 refills | Status: AC | PRN
Start: 2023-03-20 — End: ?

## 2023-03-20 NOTE — ED Triage Notes (Signed)
 Pt reports sinus drainage, chest congestion, coughing, sneezing started yesterday. Pt also reports pain to right side of chest.

## 2023-03-20 NOTE — ED Provider Notes (Signed)
 [facility]    CSN: [id number] Arrival date & time: 03/20/23  0913      History   Chief Complaint No chief complaint on file.   HPI Carolyn [name] is a 73 y.o. female.   Patient reports that she started with cough, chest congestion, sneezing and sinus drainage yesterday.  The drainage is clear or white.  She has a little bit of facial pressure or pain at times when she coughs or sneezes.  She started having some right side chest discomfort with deep breath or cough during the night and this morning.  She reports that several years ago she had shingles in that area and although the pain from the shingles resolves, she does feel like that area is easily irritated.  She denies fever, nausea, vomiting, constipation.  She reports she has had her gallbladder out previously and since her cholecystectomy, she has loose stools or diarrhea with every meal.     Past Medical History:  Diagnosis Date   Abnormal mammogram 05/10/2017   Acquired hypothyroidism 07/07/2016   Acute gastritis 07/07/2016   Acute pain of right knee 09/20/2019   AKI (acute kidney injury) (HCC) 07/25/2020   Allergic rhinitis 07/07/2016   Anemia 07/07/2016   Anxiety 07/07/2016   Arthritis    Osteoarthritis   ATRIAL FIBRILLATION 11/27/2008   Qualifier: Diagnosis of  By: Abner Greenspan PharmD, Sally     Atypical chest pain 07/07/2016   Benign essential hypertension 07/07/2016   Overview:  stable on current meds   Benign paroxysmal positional vertigo 07/07/2016   Overview:  Avoid rapid headmovement and position change. May taper the gabapentin to see if helps.   Bilateral low back pain without sciatica 07/07/2016   Overview:  continue gabapentin and meloxicam,   Bimalleolar ankle fracture 02/21/2014   Bronchitis 07/07/2016   Carpal tunnel syndrome    CHF (congestive heart failure) (HCC)    08-2013   Chronic anticoagulation 12/03/2014   Chronic diastolic heart failure (HCC) 12/03/2014   CKD (chronic kidney disease) stage 3, GFR  30-59 ml/min (HCC) 11/21/2014   COPD (chronic obstructive pulmonary disease) (HCC) 07/07/2016   Diarrhea due to malabsorption 08/17/2017   DM2 (diabetes mellitus, type 2) (HCC)    Edema, peripheral 07/07/2016   Overview:  increase lasix to 40 mg bid   Encounter for annual physical examination excluding gynecological examination in a patient older than 17 years 03/17/2019   Enthesopathy 07/07/2016   Esophagitis, reflux 07/07/2016   Essential hypertension 05/06/2015   Functional diarrhea 07/07/2016   GERD (gastroesophageal reflux disease)    Great toe pain, left 05/25/2018   Headache 07/07/2016   Heart failure (HCC)    History of diverticulosis 07/07/2016   HLD (hyperlipidemia)    HTN (hypertension)    Hyperlipidemia 05/06/2015   Hypertensive heart disease 07/07/2016   Overview:  stable on current meds   Hypertensive heart disease with heart failure (HCC) 12/03/2014   Hypothyroid 07/07/2016   Hypothyroidism    Irritable bowel syndrome 07/07/2016   Overview:  IBS information sheet provided,  dietary changes suggested   Low back pain 07/07/2016   Overview:  lumbar spine films at [facility]   Major depressive disorder, recurrent, moderate (HCC) 07/07/2016   Microalbuminuria due to type 2 diabetes mellitus (HCC) 05/06/2015   Midline low back pain without sciatica 07/07/2016   Overview:  --RTC for warning signs (Fevers, chills, loss of bowel/bladder function, saddle numbness), worsening pain or failure for pain to improve with symptomatic treatment --reviewed natural  course of disease that acute LBP takes 4-6 weeks to recover --cont meds listed for symptomatic relief --follow up in 2-4 weeks or earlier if warning signs develop   Mild CAD 04/06/2018   Mixed dyslipidemia 03/17/2019   Nerve pain 07/07/2016   Obesity    Obesity (BMI 30-39.9) 05/06/2015   OSA (obstructive sleep apnea) 07/07/2016   Osteoarthritis 07/07/2016   Other ventral hernia 07/07/2016   Overweight 07/07/2016   Pacemaker 11/21/2014   PAF (paroxysmal  atrial fibrillation) (HCC)    Paroxysmal atrial fibrillation (HCC) 11/27/2008   CHADS2 vasc=4    Presence of permanent cardiac pacemaker    09-24-13 01-21-14 Last interrogation in [facility]   Rhinosinusitis 07/07/2016   S/P total knee replacement 08/31/2021   Seasonal allergies 07/07/2016   Severe obesity (BMI 35.0-39.9) with comorbidity (HCC) 05/12/2018   Shortness of breath 04/11/2019   Sleep apnea    No cpap used   Smoker 07/07/2016   SSS (sick sinus syndrome) (HCC) 11/21/2014   Symptomatic menopausal or female climacteric states 07/07/2016   Thoracic back pain 07/07/2016   Type 2 diabetes mellitus with stage 2 chronic kidney disease, with long-term current use of insulin (HCC) 08/25/2015   Type II or unspecified type diabetes mellitus with renal manifestations, uncontrolled 07/07/2016    Patient Active Problem List   Diagnosis Date Noted   S/P total knee replacement 08/31/2021   AKI (acute kidney injury) (HCC) 07/25/2020   Heart failure (HCC)    HTN (hypertension)    HLD (hyperlipidemia)    Hypothyroidism    Sleep apnea    Presence of permanent cardiac pacemaker    PAF (paroxysmal atrial fibrillation) (HCC)    Obesity    DM2 (diabetes mellitus, type 2) (HCC)    CHF (congestive heart failure) (HCC)    Arthritis    Acute pain of right knee 09/20/2019   Shortness of breath 04/11/2019   Encounter for annual physical examination excluding gynecological examination in a patient older than 17 years 03/17/2019   Mixed dyslipidemia 03/17/2019   Great toe pain, left 05/25/2018   Severe obesity (BMI 35.0-39.9) with comorbidity (HCC) 05/12/2018   Mild CAD 04/06/2018   Pain in joint of right hip 03/24/2018   Diarrhea due to malabsorption 08/17/2017   Abnormal mammogram 05/10/2017   Pain of left hand 05/07/2017   Acquired hypothyroidism 07/07/2016   Acute gastritis 07/07/2016   Allergic rhinitis 07/07/2016   Anemia 07/07/2016   Anxiety 07/07/2016   Atypical chest pain  07/07/2016   Hypertensive heart disease 07/07/2016   Benign paroxysmal positional vertigo 07/07/2016   Bilateral low back pain without sciatica 07/07/2016   Bronchitis 07/07/2016   Carpal tunnel syndrome 07/07/2016   COPD (chronic obstructive pulmonary disease) (HCC) 07/07/2016   Edema, peripheral 07/07/2016   Enthesopathy 07/07/2016   Esophagitis, reflux 07/07/2016   Functional diarrhea 07/07/2016   GERD (gastroesophageal reflux disease) 07/07/2016   Headache 07/07/2016   History of diverticulosis 07/07/2016   Hypothyroid 07/07/2016   Irritable bowel syndrome 07/07/2016   Low back pain 07/07/2016   Major depressive disorder, recurrent, moderate (HCC) 07/07/2016   Midline low back pain without sciatica 07/07/2016   Nerve pain 07/07/2016   OSA (obstructive sleep apnea) 07/07/2016   Osteoarthritis 07/07/2016   Other ventral hernia 07/07/2016   Overweight 07/07/2016   Rhinosinusitis 07/07/2016   Seasonal allergies 07/07/2016   Smoker 07/07/2016   Symptomatic menopausal or female climacteric states 07/07/2016   Thoracic back pain 07/07/2016   Type  II or unspecified type diabetes mellitus with renal manifestations, uncontrolled 07/07/2016   Benign essential hypertension 07/07/2016   Mood disorder (HCC) 07/07/2016   Type 2 diabetes mellitus with stage 2 chronic kidney disease, with long-term current use of insulin (HCC) 08/25/2015   Essential hypertension 05/06/2015   Hyperlipidemia 05/06/2015   Microalbuminuria due to type 2 diabetes mellitus (HCC) 05/06/2015   Obesity (BMI 30-39.9) 05/06/2015   Chronic anticoagulation 12/03/2014   Chronic diastolic heart failure (HCC) 12/03/2014   Hypertensive heart disease with heart failure (HCC) 12/03/2014   CKD (chronic kidney disease) stage 3, GFR 30-59 ml/min (HCC) 11/21/2014   Pacemaker 11/21/2014   SSS (sick sinus syndrome) (HCC) 11/21/2014   Bimalleolar ankle fracture 02/21/2014   Paroxysmal atrial fibrillation (HCC) 11/27/2008    ATRIAL FIBRILLATION 11/27/2008    Past Surgical History:  Procedure Laterality Date   BREAST LUMPECTOMY WITH RADIOACTIVE SEED LOCALIZATION Right 08/02/2017   Procedure: BREAST LUMPECTOMY WITH RADIOACTIVE SEED LOCALIZATION;  Surgeon: Harriette Bouillon, MD;  Location: [facility] OR;  Service: General;  Laterality: Right;   CARPAL TUNNEL RELEASE     CATARACT EXTRACTION W/ INTRAOCULAR LENS IMPLANT Bilateral    CHOLECYSTECTOMY     HERNIA REPAIR     ORIF ANKLE FRACTURE Left 02/21/2014   Procedure: OPEN REDUCTION INTERNAL FIXATION (ORIF) LEFT  ANKLE FRACTURE;  Surgeon: Jacki Cones, MD;  Location: [facility];  Service: Orthopedics;  Laterality: Left;   PACEMAKER INSERTION  09/24/2013   TOTAL HIP ARTHROPLASTY Left    Bursa cut   TOTAL KNEE ARTHROPLASTY Right 08/31/2021   Procedure: TOTAL KNEE ARTHROPLASTY;  Surgeon: Dannielle Huh, MD;  Location: [facility];  Service: Orthopedics;  Laterality: Right;   TUBAL LIGATION      OB History   No obstetric history on file.      Home Medications    Prior to Admission medications   Medication Sig Start Date End Date Taking? Authorizing Provider  atorvastatin (LIPITOR) 20 MG tablet TAKE 1 TABLET BY MOUTH EVERY DAY 02/11/23  Yes Baldo Daub, MD  empagliflozin (JARDIANCE) 25 MG TABS tablet Take 25 mg by mouth daily. 11/14/19  Yes [provider]  escitalopram (LEXAPRO) 20 MG tablet Take 20 mg by mouth every other day.   Yes [provider]  finasteride (PROSCAR) 5 MG tablet Take 2.5 mg by mouth daily.   Yes [provider]  gabapentin (NEURONTIN) 300 MG capsule Take 300 mg by mouth 2 (two) times daily. 03/19/19  Yes [provider]  levothyroxine (SYNTHROID) 137 MCG tablet Take 137 mcg by mouth daily before breakfast. 11/02/22  Yes [provider]  metoprolol tartrate (LOPRESSOR) 50 MG tablet TAKE 1 TABLET BY MOUTH TWICE A DAY 09/23/22  Yes Baldo Daub, MD  omega-3 acid ethyl esters (LOVAZA) 1 g capsule Take 2 capsules  (2 g total) by mouth 2 (two) times daily. 01/10/23  Yes Baldo Daub, MD  pantoprazole (PROTONIX) 40 MG tablet Take 40 mg by mouth every morning. 06/26/21  Yes [provider]  promethazine-dextromethorphan (PROMETHAZINE-DM) 6.25-15 MG/5ML syrup Take 5 mLs by mouth 4 (four) times daily as needed for cough. Do not use and drive - May make drowsy. 03/20/23  Yes Prescilla Sours, FNP  Semaglutide, 2 MG/DOSE, (OZEMPIC, 2 MG/DOSE,) 8 MG/3ML SOPN Inject 2 mg into the skin once a week. 11/27/21  Yes [provider]  TRESIBA FLEXTOUCH 100 UNIT/ML FlexTouch Pen Inject 80 Units into the skin at bedtime. 05/21/20  Yes [provider]  Vitamin D, Ergocalciferol, (DRISDOL) 1.25 MG (50000 UT) CAPS capsule Take 50,000 Units by mouth every Sunday. 09/19/17  Yes [provider]  acetaminophen (TYLENOL) 500 MG tablet Take 1,000 mg by mouth every 6 (six) hours as needed for mild pain.    [provider]  albuterol (VENTOLIN HFA) 108 (90 Base) MCG/ACT inhaler Inhale 2 puffs into the lungs every 6 (six) hours as needed for wheezing or shortness of breath. 03/20/23   Prescilla Sours, FNP  apixaban (ELIQUIS) 5 MG TABS tablet Take 1 tablet (5 mg total) by mouth 2 (two) times daily. 08/04/22   Baldo Daub, MD  diltiazem (CARDIZEM) 30 MG tablet Take 1 tablet (30 mg total) by mouth every 6 (six) hours as needed (for elevated heart rates). 01/03/23   Camnitz, Andree Coss, MD  glucose blood test strip 1 each by Other route as needed for other (Glucose check). 03/31/17   [provider]  Insulin Pen Needle 32G X 4 MM MISC 1 each by Other route daily. 12/19/17   [provider]  oxyCODONE (OXY IR/ROXICODONE) 5 MG immediate release tablet Take 1-2 tablets (5-10 mg total) by mouth every 6 (six) hours as needed for moderate pain (pain score 4-6). 09/01/21   Guy Sandifer, PA  SYMBICORT 160-4.5 MCG/ACT inhaler Inhale 2 puffs into the lungs 2 (two) times daily as needed for  shortness of breath. 07/04/17   [provider]    Family History Family History  Problem Relation Age of Onset   Dementia Mother    Heart attack Father    Diabetes Sister    Dementia Sister    Diabetes Brother     Social History Social History   Tobacco Use   Smoking status: Former    Current packs/day: 0.00    Types: Cigarettes    Start date: 11/26/2006    Quit date: 11/25/2008    Years since quitting: 14.3    Passive exposure: Past   Smokeless tobacco: Never  Vaping Use   Vaping status: Never Used  Substance Use Topics   Alcohol use: No   Drug use: No     Allergies   Meperidine and Hydrocodone-acetaminophen   Review of Systems Review of Systems  Constitutional:  Negative for chills and fever.  HENT:  Positive for congestion, rhinorrhea, sinus pressure and sinus pain. Negative for ear pain and sore throat.   Eyes:  Negative for pain and visual disturbance.  Respiratory:  Positive for cough and chest tightness. Negative for shortness of breath.   Cardiovascular:  Negative for chest pain and palpitations.  Gastrointestinal:  Positive for diarrhea. Negative for abdominal pain, constipation, nausea and vomiting.  Genitourinary:  Negative for dysuria and hematuria.  Musculoskeletal:  Negative for arthralgias and back pain.  Skin:  Negative for color change and rash.  Neurological:  Negative for seizures and syncope.  All other systems reviewed and are negative.    Physical Exam Triage Vital Signs ED Triage Vitals  Encounter Vitals Group     BP 03/20/23 0927 117/77     Systolic BP Percentile --      Diastolic BP Percentile --      Pulse Rate 03/20/23 0927 70     Resp 03/20/23 0927 18     Temp 03/20/23 0927 97.7 F (36.5 C)     Temp Source 03/20/23 0927 Oral     SpO2 03/20/23 0927 96 %     Weight --      Height --  Head Circumference --      Peak Flow --      Pain Score 03/20/23 0923 1     Pain Loc --      Pain Education --      Exclude  from Growth Chart --    No data found.  Updated Vital Signs BP 117/77 (BP Location: Right Arm)   Pulse 70   Temp 97.7 F (36.5 C) (Oral)   Resp 18   LMP 02/26/2005   SpO2 96%   Visual Acuity Right Eye Distance:   Left Eye Distance:   Bilateral Distance:    Right Eye Near:   Left Eye Near:    Bilateral Near:     Physical Exam Vitals and nursing note reviewed.  Constitutional:      General: She is not in acute distress.    Appearance: She is well-developed. She is not ill-appearing or toxic-appearing.  HENT:     Head: Normocephalic and atraumatic.     Right Ear: Hearing, tympanic membrane, ear canal and external ear normal.     Left Ear: Hearing, tympanic membrane, ear canal and external ear normal.     Nose: Congestion and rhinorrhea present. Rhinorrhea is clear.     Right Sinus: No maxillary sinus tenderness or frontal sinus tenderness.     Left Sinus: No maxillary sinus tenderness or frontal sinus tenderness.     Mouth/Throat:     Lips: Pink.     Mouth: Mucous membranes are moist.     Pharynx: Uvula midline. No oropharyngeal exudate or posterior oropharyngeal erythema.     Tonsils: No tonsillar exudate.  Eyes:     Conjunctiva/sclera: Conjunctivae normal.     Pupils: Pupils are equal, round, and reactive to light.  Cardiovascular:     Rate and Rhythm: Normal rate and regular rhythm.     Heart sounds: S1 normal and S2 normal. No murmur heard. Pulmonary:     Effort: Pulmonary effort is normal. No respiratory distress.     Breath sounds: Normal breath sounds. No decreased breath sounds, wheezing, rhonchi or rales.  Chest:     Chest wall: Tenderness (Along the lateral and anterior right lower ribs.) present.  Abdominal:     General: Bowel sounds are normal.     Palpations: Abdomen is soft.     Tenderness: There is abdominal tenderness (Mild) in the right upper quadrant.  Musculoskeletal:        General: No swelling.     Cervical back: Neck supple.   Lymphadenopathy:     Head:     Right side of head: No submental, submandibular, tonsillar, preauricular or posterior auricular adenopathy.     Left side of head: No submental, submandibular, tonsillar, preauricular or posterior auricular adenopathy.     Cervical: No cervical adenopathy.     Right cervical: No superficial cervical adenopathy.    Left cervical: No superficial cervical adenopathy.  Skin:    General: Skin is warm and dry.     Capillary Refill: Capillary refill takes less than 2 seconds.     Findings: No rash.  Neurological:     Mental Status: She is alert and oriented to person, place, and time.  Psychiatric:        Mood and Affect: Mood normal.      UC Treatments / Results  Labs (all labs ordered are listed, but only abnormal results are displayed) Labs Reviewed  POC COVID19/FLU A&B COMBO - Normal  RESPIRATORY PANEL BY PCR  EKG   Radiology No results found.  Procedures Procedures (including critical care time)  Medications Ordered in UC Medications - No data to display  Initial Impression / Assessment and Plan / UC Course  I have reviewed the triage vital signs and the nursing notes.  Pertinent labs & imaging results that were available during my care of the patient were reviewed by me and considered in my medical decision making (see chart for details).     Flu and COVID is negative.  Will get a adult respiratory panel.  If panel is abnormal she will be contacted and provided medication if needed.  If panel is negative we will be available on the portal.  Get plenty of fluids and rest.  Refilled her albuterol inhaler, 2 puffs, every 4 hours, as needed for wheezing.  Promethazine DM, 5 mL, every 6 hours, as needed for cough.  Follow-up if symptoms do not improve, worsen or new symptoms occur. Final Clinical Impressions(s) / UC Diagnoses   Final diagnoses:  Acute cough  Acute viral bronchitis     Discharge Instructions      Negative for flu  and COVID.  Adult respiratory panel collected and sent.  Will treat for acute viral bronchitis.  Refilled her Ventolin inhaler, 2 puffs, every 4 hours as needed for wheezing.  Promethazine DM, 5 mL, every 6 hours, as needed for cough.  Get plenty of fluids and rest.  Will adjust her plan of care, if needed once the respiratory panel results.  Will contact her if there is any abnormal results.  If the respiratory panel is completely negative it will be available on the portal.  Follow-up if symptoms do not improve, worsen or new symptoms occur.     ED Prescriptions     Medication Sig Dispense Auth. Provider   promethazine-dextromethorphan (PROMETHAZINE-DM) 6.25-15 MG/5ML syrup Take 5 mLs by mouth 4 (four) times daily as needed for cough. Do not use and drive - May make drowsy. 118 mL Prescilla Sours, FNP   albuterol (VENTOLIN HFA) 108 (90 Base) MCG/ACT inhaler Inhale 2 puffs into the lungs every 6 (six) hours as needed for wheezing or shortness of breath. 1 each Prescilla Sours, FNP      PDMP not reviewed this encounter.   Prescilla Sours, FNP 03/20/23 1053

## 2023-03-20 NOTE — Discharge Instructions (Signed)
 Negative for flu and COVID.  Adult respiratory panel collected and sent.  Will treat for acute viral bronchitis.  Refilled her Ventolin inhaler, 2 puffs, every 4 hours as needed for wheezing.  Promethazine DM, 5 mL, every 6 hours, as needed for cough.  Get plenty of fluids and rest.  Will adjust her plan of care, if needed once the respiratory panel results.  Will contact her if there is any abnormal results.  If the respiratory panel is completely negative it will be available on the portal.  Follow-up if symptoms do not improve, worsen or new symptoms occur.

## 2023-03-21 ENCOUNTER — Telehealth (HOSPITAL_BASED_OUTPATIENT_CLINIC_OR_DEPARTMENT_OTHER): Payer: Self-pay | Admitting: Family Medicine

## 2023-03-21 NOTE — Telephone Encounter (Signed)
 Per the laboratory, there was a problem with the Respiratory Panel Specimen and the test could not be run.  The patient was not charged for the test but she will not get results.  I left her a VM message with this information.  I encouraged her to follow-up here if she is not improving and I apologized for this collection mishap.

## 2023-04-20 ENCOUNTER — Ambulatory Visit (INDEPENDENT_AMBULATORY_CARE_PROVIDER_SITE_OTHER): Payer: PPO

## 2023-04-20 DIAGNOSIS — I495 Sick sinus syndrome: Secondary | ICD-10-CM

## 2023-04-20 LAB — CUP PACEART REMOTE DEVICE CHECK
Battery Impedance: 996 Ohm
Battery Remaining Longevity: 73 mo
Battery Voltage: 2.78 V
Brady Statistic AP VP Percent: 1 %
Brady Statistic AP VS Percent: 39 %
Brady Statistic AS VP Percent: 0 %
Brady Statistic AS VS Percent: 60 %
Date Time Interrogation Session: 20250324225412
Implantable Lead Connection Status: 753985
Implantable Lead Connection Status: 753985
Implantable Lead Implant Date: 20150831
Implantable Lead Implant Date: 20150831
Implantable Lead Location: 753859
Implantable Lead Location: 753860
Implantable Lead Model: 4092
Implantable Lead Model: 5076
Implantable Pulse Generator Implant Date: 20150831
Lead Channel Impedance Value: 487 Ohm
Lead Channel Impedance Value: 658 Ohm
Lead Channel Pacing Threshold Amplitude: 0.625 V
Lead Channel Pacing Threshold Amplitude: 1.5 V
Lead Channel Pacing Threshold Pulse Width: 0.4 ms
Lead Channel Pacing Threshold Pulse Width: 0.4 ms
Lead Channel Setting Pacing Amplitude: 1.5 V
Lead Channel Setting Pacing Amplitude: 3 V
Lead Channel Setting Pacing Pulse Width: 0.4 ms
Lead Channel Setting Sensing Sensitivity: 2 mV
Zone Setting Status: 755011
Zone Setting Status: 755011

## 2023-04-29 ENCOUNTER — Telehealth (HOSPITAL_BASED_OUTPATIENT_CLINIC_OR_DEPARTMENT_OTHER): Payer: Self-pay

## 2023-05-02 ENCOUNTER — Telehealth: Payer: Self-pay | Admitting: Cardiology

## 2023-05-02 MED ORDER — APIXABAN 5 MG PO TABS: 5.0000 mg | ORAL_TABLET | Freq: Two times a day (BID) | ORAL | 1 refills | Status: DC

## 2023-05-02 NOTE — Telephone Encounter (Signed)
 Prescription refill request for Eliquis received. Indication: PAF Last office visit: 01/03/23  Carleene Mains MD Scr: 1.03 on 11/26/22  Epic Age: 73 Weight: 90.3kg  Based on above findings Eliquis 5mg  twice daily is the appropriate dose.  Refill approved.

## 2023-05-02 NOTE — Telephone Encounter (Signed)
*  STAT* If patient is at the pharmacy, call can be transferred to refill team.   1. Which medications need to be refilled? (please list name of each medication and dose if known)   apixaban (ELIQUIS) 5 MG TABS tablet   2. Would you like to learn more about the convenience, safety, & potential cost savings by using the [facility]?   3. Are you open to using the [facility] (Type [facility]. ).  4. Which pharmacy/location (including street and city if local pharmacy) is medication to be sent to?  [id number] - [place],  - [address]   5. Do they need a 30 day or 90 day supply?   90 day  Patient stated she has enough medication for this week.

## 2023-05-04 ENCOUNTER — Ambulatory Visit: Admitting: Podiatry

## 2023-05-04 ENCOUNTER — Ambulatory Visit (INDEPENDENT_AMBULATORY_CARE_PROVIDER_SITE_OTHER)

## 2023-05-04 DIAGNOSIS — M778 Other enthesopathies, not elsewhere classified: Secondary | ICD-10-CM

## 2023-05-04 DIAGNOSIS — D2372 Other benign neoplasm of skin of left lower limb, including hip: Secondary | ICD-10-CM

## 2023-05-04 DIAGNOSIS — L84 Corns and callosities: Secondary | ICD-10-CM

## 2023-05-04 NOTE — Progress Notes (Unsigned)
 Chief Complaint  Patient presents with   Foot Pain    Last A1c was 6 months ago it was 5.3 and takes Eliquis.  Here today for left forefoot pain, there is an area on the plantar just under the space between the 1st and 2nd toes. It have been shave down once, but there is no callous on it today. Xrays up. Been hurting for 3 weeks.    HPI: 73 y.o. female presents today with concern of a painful skin lesion left submet 2.  Denies any drainage.  Denies stepping on foreign object.  Past Medical History:  Diagnosis Date   Abnormal mammogram 05/10/2017   Acquired hypothyroidism 07/07/2016   Acute gastritis 07/07/2016   Acute pain of right knee 09/20/2019   AKI (acute kidney injury) (HCC) 07/25/2020   Allergic rhinitis 07/07/2016   Anemia 07/07/2016   Anxiety 07/07/2016   Arthritis    Osteoarthritis   ATRIAL FIBRILLATION 11/27/2008   Qualifier: Diagnosis of  By: Abner Greenspan PharmD, Sally     Atypical chest pain 07/07/2016   Benign essential hypertension 07/07/2016   Overview:  stable on current meds   Benign paroxysmal positional vertigo 07/07/2016   Overview:  Avoid rapid headmovement and position change. May taper the gabapentin to see if helps.   Bilateral low back pain without sciatica 07/07/2016   Overview:  continue gabapentin and meloxicam,   Bimalleolar ankle fracture 02/21/2014   Bronchitis 07/07/2016   Carpal tunnel syndrome    CHF (congestive heart failure) (HCC)    08-2013   Chronic anticoagulation 12/03/2014   Chronic diastolic heart failure (HCC) 12/03/2014   CKD (chronic kidney disease) stage 3, GFR 30-59 ml/min (HCC) 11/21/2014   COPD (chronic obstructive pulmonary disease) (HCC) 07/07/2016   Diarrhea due to malabsorption 08/17/2017   DM2 (diabetes mellitus, type 2) (HCC)    Edema, peripheral 07/07/2016   Overview:  increase lasix to 40 mg bid   Encounter for annual physical examination excluding gynecological examination in a patient older than 17 years 03/17/2019   Enthesopathy  07/07/2016   Esophagitis, reflux 07/07/2016   Essential hypertension 05/06/2015   Functional diarrhea 07/07/2016   GERD (gastroesophageal reflux disease)    Great toe pain, left 05/25/2018   Headache 07/07/2016   Heart failure (HCC)    History of diverticulosis 07/07/2016   HLD (hyperlipidemia)    HTN (hypertension)    Hyperlipidemia 05/06/2015   Hypertensive heart disease 07/07/2016   Overview:  stable on current meds   Hypertensive heart disease with heart failure (HCC) 12/03/2014   Hypothyroid 07/07/2016   Hypothyroidism    Irritable bowel syndrome 07/07/2016   Overview:  IBS information sheet provided,  dietary changes suggested   Low back pain 07/07/2016   Overview:  [facility] films [facility]   Major depressive disorder, recurrent, moderate (HCC) 07/07/2016   Microalbuminuria due to type 2 diabetes mellitus (HCC) 05/06/2015   Midline low back pain without sciatica 07/07/2016   Overview:  --RTC for warning signs (Fevers, chills, loss of bowel/bladder function, saddle numbness), worsening pain or failure for pain to improve with symptomatic treatment --reviewed natural course of disease that acute LBP takes 4-6 weeks to recover --cont meds listed for symptomatic relief --follow up in 2-4 weeks or earlier if warning signs develop   Mild CAD 04/06/2018   Mixed dyslipidemia 03/17/2019   Nerve pain 07/07/2016   Obesity    Obesity (BMI 30-39.9) 05/06/2015   OSA (obstructive sleep apnea) 07/07/2016  Osteoarthritis 07/07/2016   Other ventral hernia 07/07/2016   Overweight 07/07/2016   Pacemaker 11/21/2014   PAF (paroxysmal atrial fibrillation) (HCC)    Paroxysmal atrial fibrillation (HCC) 11/27/2008   CHADS2 vasc=4    Presence of permanent cardiac pacemaker    09-24-13 01-21-14 Last interrogation in Epic at [facility]   Rhinosinusitis 07/07/2016   S/P total knee replacement 08/31/2021   Seasonal allergies 07/07/2016   Severe obesity (BMI 35.0-39.9) with comorbidity (HCC) 05/12/2018   Shortness of  breath 04/11/2019   Sleep apnea    No cpap used   Smoker 07/07/2016   SSS (sick sinus syndrome) (HCC) 11/21/2014   Symptomatic menopausal or female climacteric states 07/07/2016   Thoracic back pain 07/07/2016   Type 2 diabetes mellitus with stage 2 chronic kidney disease, with long-term current use of insulin (HCC) 08/25/2015   Type II or unspecified type diabetes mellitus with renal manifestations, uncontrolled 07/07/2016    Past Surgical History:  Procedure Laterality Date   BREAST LUMPECTOMY WITH RADIOACTIVE SEED LOCALIZATION Right 08/02/2017   Procedure: BREAST LUMPECTOMY WITH RADIOACTIVE SEED LOCALIZATION;  Surgeon: Harriette Bouillon, MD;  Location: [facility] OR;  Service: General;  Laterality: Right;   CARPAL TUNNEL RELEASE     CATARACT EXTRACTION W/ INTRAOCULAR LENS IMPLANT Bilateral    CHOLECYSTECTOMY     HERNIA REPAIR     ORIF ANKLE FRACTURE Left 02/21/2014   Procedure: OPEN REDUCTION INTERNAL FIXATION (ORIF) LEFT  ANKLE FRACTURE;  Surgeon: Jacki Cones, MD;  Location: [facility];  Service: Orthopedics;  Laterality: Left;   PACEMAKER INSERTION  09/24/2013   TOTAL HIP ARTHROPLASTY Left    Bursa cut   TOTAL KNEE ARTHROPLASTY Right 08/31/2021   Procedure: TOTAL KNEE ARTHROPLASTY;  Surgeon: Dannielle Huh, MD;  Location: [facility];  Service: Orthopedics;  Laterality: Right;   TUBAL LIGATION      Allergies  Allergen Reactions   Meperidine Nausea And Vomiting and Other (See Comments)    Was given patch behind her ear and was better   Hydrocodone-Acetaminophen Other (See Comments)    REACTION: Patient states she doesnt break in hives or anything. Just feels bad when she takes it.     Physical Exam: There are palpable pedal pulses.  There is focal hyperkeratotic lesion with pain on palpation left submet 2.  There is no surrounding erythema or any active drainage noted.  No evidence of foreign body is seen.  Epicritic sensation is intact  Radiographic Exam:  Normal osseous mineralization.  Mild  decrease in first MPJ space.  Mild increase in first intermetatarsal angle and hallux abductus angle.  No evidence of foreign body seen within the soft tissues.  Metatarsal parabola appears normal  Assessment/Plan of Care: 1. Benign neoplasm of skin of left foot   2. Capsulitis of left foot   3. Corns     Discussed clinical findings with patient today.  The benign skin neoplasm was shaved with sterile #313 blade.  1 application of Cantharone solution was applied to the area followed by an occlusive Band-Aid.  She may experience blistering in 24 to 48 hours.  Will reassess in approximately 4 weeks.   Clerance Lav, DPM, FACFAS [facility]     [address]                                        [place],  [place] [address]                Office [phone]  Fax [phone]

## 2023-05-17 ENCOUNTER — Ambulatory Visit (HOSPITAL_BASED_OUTPATIENT_CLINIC_OR_DEPARTMENT_OTHER)
Admission: RE | Admit: 2023-05-17 | Discharge: 2023-05-17 | Disposition: A | Discharge status: Home / Self Care | Source: Ambulatory Visit | Attending: Cardiology | Admitting: Cardiology

## 2023-05-17 ENCOUNTER — Ambulatory Visit (HOSPITAL_BASED_OUTPATIENT_CLINIC_OR_DEPARTMENT_OTHER)

## 2023-05-17 DIAGNOSIS — R7989 Other specified abnormal findings of blood chemistry: Secondary | ICD-10-CM | POA: Insufficient documentation

## 2023-06-01 ENCOUNTER — Ambulatory Visit: Admitting: Podiatry

## 2023-06-01 DIAGNOSIS — D2372 Other benign neoplasm of skin of left lower limb, including hip: Secondary | ICD-10-CM

## 2023-06-01 NOTE — Progress Notes (Signed)
 Chief Complaint  Patient presents with   Plantar Warts    Left foot PW, it is calloused over, and now getting sore. Last A1c was 5.5, three weeks. She takes Plavix.    HPI: 73 y.o. female presents today for follow-up of painful skin lesion left submet 2.  She notes that it is somewhat painful today.  She did get relief from the offloading pads.  Denies any change with the foot itself since last seen, no new injuries  Past Medical History:  Diagnosis Date   Abnormal mammogram 05/10/2017   Acquired hypothyroidism 07/07/2016   Acute gastritis 07/07/2016   Acute pain of right knee 09/20/2019   AKI (acute kidney injury) (HCC) 07/25/2020   Allergic rhinitis 07/07/2016   Anemia 07/07/2016   Anxiety 07/07/2016   Arthritis    Osteoarthritis   ATRIAL FIBRILLATION 11/27/2008   Qualifier: Diagnosis of  By: Mingo Amas PharmD, Sally     Atypical chest pain 07/07/2016   Benign essential hypertension 07/07/2016   Overview:  stable on current meds   Benign paroxysmal positional vertigo 07/07/2016   Overview:  Avoid rapid headmovement and position change. May taper the gabapentin  to see if helps.   Bilateral low back pain without sciatica 07/07/2016   Overview:  continue gabapentin  and meloxicam,   Bimalleolar ankle fracture 02/21/2014   Bronchitis 07/07/2016   Carpal tunnel syndrome    CHF (congestive heart failure) (HCC)    08-2013   Chronic anticoagulation 12/03/2014   Chronic diastolic heart failure (HCC) 12/03/2014   CKD (chronic kidney disease) stage 3, GFR 30-59 ml/min (HCC) 11/21/2014   COPD (chronic obstructive pulmonary disease) (HCC) 07/07/2016   Diarrhea due to malabsorption 08/17/2017   DM2 (diabetes mellitus, type 2) (HCC)    Edema, peripheral 07/07/2016   Overview:  increase lasix  to 40 mg bid   Encounter for annual physical examination excluding gynecological examination in a patient older than 17 years 03/17/2019   Enthesopathy 07/07/2016   Esophagitis, reflux 07/07/2016   Essential hypertension  05/06/2015   Functional diarrhea 07/07/2016   GERD (gastroesophageal reflux disease)    Great toe pain, left 05/25/2018   Headache 07/07/2016   Heart failure (HCC)    History of diverticulosis 07/07/2016   HLD (hyperlipidemia)    HTN (hypertension)    Hyperlipidemia 05/06/2015   Hypertensive heart disease 07/07/2016   Overview:  stable on current meds   Hypertensive heart disease with heart failure (HCC) 12/03/2014   Hypothyroid 07/07/2016   Hypothyroidism    Irritable bowel syndrome 07/07/2016   Overview:  IBS information sheet provided,  dietary changes suggested   Low back pain 07/07/2016   Overview:  l[facility]bar spine films at [facility]   Major depressive disorder, recurrent, moderate (HCC) 07/07/2016   Microalbuminuria due to type 2 diabetes mellitus (HCC) 05/06/2015   Midline low back pain without sciatica 07/07/2016   Overview:  --RTC for warning signs (Fevers, chills, loss of bowel/bladder function, saddle numbness), worsening pain or failure for pain to improve with symptomatic treatment --reviewed natural course of disease that acute LBP takes 4-6 weeks to recover --cont meds listed for symptomatic relief --follow up in 2-4 weeks or earlier if warning signs develop   Mild CAD 04/06/2018   Mixed dyslipidemia 03/17/2019   Nerve pain 07/07/2016   Obesity    Obesity (BMI 30-39.9) 05/06/2015   OSA (obstructive sleep apnea) 07/07/2016   Osteoarthritis 07/07/2016   Other ventral hernia 07/07/2016   Overweight 07/07/2016   Pacemaker 11/21/2014  PAF (paroxysmal atrial fibrillation) (HCC)    Paroxysmal atrial fibrillation (HCC) 11/27/2008   CHADS2 vasc=4    Presence of permanent cardiac pacemaker    09-24-13 01-21-14 Last interrogation in Epic at [facility]   Rhinosinusitis 07/07/2016   S/P total knee replacement 08/31/2021   Seasonal allergies 07/07/2016   Severe obesity (BMI 35.0-39.9) with comorbidity (HCC) 05/12/2018   Shortness of breath 04/11/2019   Sleep apnea    No cpap used   Smoker 07/07/2016    SSS (sick sinus syndrome) (HCC) 11/21/2014   Symptomatic menopausal or female climacteric states 07/07/2016   Thoracic back pain 07/07/2016   Type 2 diabetes mellitus with stage 2 chronic kidney disease, with long-term current use of insulin  (HCC) 08/25/2015   Type II or unspecified type diabetes mellitus with renal manifestations, uncontrolled 07/07/2016    Past Surgical History:  Procedure Laterality Date   BREAST LUMPECTOMY WITH RADIOACTIVE SEED LOCALIZATION Right 08/02/2017   Procedure: BREAST LUMPECTOMY WITH RADIOACTIVE SEED LOCALIZATION;  Surgeon: Sim Dryer, MD;  Location: [facility] OR;  Service: General;  Laterality: Right;   CARPAL TUNNEL RELEASE     CATARACT EXTRACTION W/ INTRAOCULAR LENS IMPLANT Bilateral    CHOLECYSTECTOMY     HERNIA REPAIR     ORIF ANKLE FRACTURE Left 02/21/2014   Procedure: OPEN REDUCTION INTERNAL FIXATION (ORIF) LEFT  ANKLE FRACTURE;  Surgeon: Florencia Hunter, MD;  Location: [facility];  Service: Orthopedics;  Laterality: Left;   PACEMAKER INSERTION  09/24/2013   TOTAL HIP ARTHROPLASTY Left    Bursa cut   TOTAL KNEE ARTHROPLASTY Right 08/31/2021   Procedure: TOTAL KNEE ARTHROPLASTY;  Surgeon: Christie Cox, MD;  Location: [facility];  Service: Orthopedics;  Laterality: Right;   TUBAL LIGATION      Allergies  Allergen Reactions   Meperidine  Nausea And Vomiting and Other (See Comments)    Was given patch behind her ear and was better   Hydrocodone-Acetaminophen  Other (See Comments)    REACTION: Patient states she doesnt break in hives or anything. Just feels bad when she takes it.    Physical Exam: Palpable pedal pulses left foot.  There is a focal hyperkeratotic lesion with evidence of dried blood/blister formation near the submet 2 area.  There is pain on palpation of the lesion.  There is no surrounding erythema or active drainage noted.  Assessment/Plan of Care: 1. Benign neoplasm of skin of left foot    Discussed clinical findings with patient today.  The  lesion was shaved with sterile #313 blade.  Once this was debrided there was only approximately 1 mm size diameter lesion remaining.  This was treated with 2 applications of Cantharone solution followed by an occlusive Band-Aid and an offloading pad was applied.  Will follow-up in approximately 4 weeks.  Anticipate this being the last treatment today with how much better it looks.   Joe Murders, DPM, FACFAS [facility]     [address] [place], [place] [address]                Office [phone]  Fax [phone]

## 2023-06-02 NOTE — Progress Notes (Signed)
 Remote pacemaker transmission.

## 2023-06-22 ENCOUNTER — Ambulatory Visit: Payer: Self-pay | Admitting: Cardiology

## 2023-06-28 ENCOUNTER — Telehealth: Payer: Self-pay

## 2023-06-28 NOTE — Telephone Encounter (Signed)
 Left message on My Chart with [place] results per Dr. Vanetta Shawl note. Routed to PCP.

## 2023-06-29 ENCOUNTER — Telehealth: Payer: Self-pay

## 2023-06-29 ENCOUNTER — Ambulatory Visit: Admitting: Podiatry

## 2023-06-29 DIAGNOSIS — D2372 Other benign neoplasm of skin of left lower limb, including hip: Secondary | ICD-10-CM | POA: Diagnosis not present

## 2023-06-29 NOTE — Telephone Encounter (Signed)
 Pt viewed [place] results on My Chart per Dr. Vanetta Shawl note. Routed to PCP.

## 2023-06-29 NOTE — Progress Notes (Signed)
 Chief Complaint  Patient presents with   Follow-up    Follow up for left foot lesion. It is calloused but is looking a little better. Last A1c 5.5 in May, takes Eliquis     HPI: 73 y.o. female presents today for follow-up of skin lesion left submet 2.  Cantharone was applied last visit.  She did have some discomfort and blistering for a couple days after the last treatment.  She states that she still feels something hard in the area.  Past Medical History:  Diagnosis Date   Abnormal mammogram 05/10/2017   Acquired hypothyroidism 07/07/2016   Acute gastritis 07/07/2016   Acute pain of right knee 09/20/2019   AKI (acute kidney injury) (HCC) 07/25/2020   Allergic rhinitis 07/07/2016   Anemia 07/07/2016   Anxiety 07/07/2016   Arthritis    Osteoarthritis   ATRIAL FIBRILLATION 11/27/2008   Qualifier: Diagnosis of  By: Mingo Amas PharmD, Sally     Atypical chest pain 07/07/2016   Benign essential hypertension 07/07/2016   Overview:  stable on current meds   Benign paroxysmal positional vertigo 07/07/2016   Overview:  Avoid rapid headmovement and position change. May taper the gabapentin  to see if helps.   Bilateral low back pain without sciatica 07/07/2016   Overview:  continue gabapentin  and meloxicam,   Bimalleolar ankle fracture 02/21/2014   Bronchitis 07/07/2016   Carpal tunnel syndrome    CHF (congestive heart failure) (HCC)    08-2013   Chronic anticoagulation 12/03/2014   Chronic diastolic heart failure (HCC) 12/03/2014   CKD (chronic kidney disease) stage 3, GFR 30-59 ml/min (HCC) 11/21/2014   COPD (chronic obstructive pulmonary disease) (HCC) 07/07/2016   Diarrhea due to malabsorption 08/17/2017   DM2 (diabetes mellitus, type 2) (HCC)    Edema, peripheral 07/07/2016   Overview:  increase lasix  to 40 mg bid   Encounter for annual physical examination excluding gynecological examination in a patient older than 17 years 03/17/2019   Enthesopathy 07/07/2016   Esophagitis, reflux 07/07/2016    Essential hypertension 05/06/2015   Functional diarrhea 07/07/2016   GERD (gastroesophageal reflux disease)    Great toe pain, left 05/25/2018   Headache 07/07/2016   Heart failure (HCC)    History of diverticulosis 07/07/2016   HLD (hyperlipidemia)    HTN (hypertension)    Hyperlipidemia 05/06/2015   Hypertensive heart disease 07/07/2016   Overview:  stable on current meds   Hypertensive heart disease with heart failure (HCC) 12/03/2014   Hypothyroid 07/07/2016   Hypothyroidism    Irritable bowel syndrome 07/07/2016   Overview:  IBS information sheet provided,  dietary changes suggested   Low back pain 07/07/2016   Overview:  l[facility]bar spine films at [facility]   Major depressive disorder, recurrent, moderate (HCC) 07/07/2016   Microalbuminuria due to type 2 diabetes mellitus (HCC) 05/06/2015   Midline low back pain without sciatica 07/07/2016   Overview:  --RTC for warning signs (Fevers, chills, loss of bowel/bladder function, saddle numbness), worsening pain or failure for pain to improve with symptomatic treatment --reviewed natural course of disease that acute LBP takes 4-6 weeks to recover --cont meds listed for symptomatic relief --follow up in 2-4 weeks or earlier if warning signs develop   Mild CAD 04/06/2018   Mixed dyslipidemia 03/17/2019   Nerve pain 07/07/2016   Obesity    Obesity (BMI 30-39.9) 05/06/2015   OSA (obstructive sleep apnea) 07/07/2016   Osteoarthritis 07/07/2016   Other ventral hernia 07/07/2016   Overweight 07/07/2016   Pacemaker 11/21/2014  PAF (paroxysmal atrial fibrillation) (HCC)    Paroxysmal atrial fibrillation (HCC) 11/27/2008   CHADS2 vasc=4    Presence of permanent cardiac pacemaker    09-24-13 01-21-14 Last interrogation in Epic at [facility]   Rhinosinusitis 07/07/2016   S/P total knee replacement 08/31/2021   Seasonal allergies 07/07/2016   Severe obesity (BMI 35.0-39.9) with comorbidity (HCC) 05/12/2018   Shortness of breath 04/11/2019   Sleep apnea    No cpap used    Smoker 07/07/2016   SSS (sick sinus syndrome) (HCC) 11/21/2014   Symptomatic menopausal or female climacteric states 07/07/2016   Thoracic back pain 07/07/2016   Type 2 diabetes mellitus with stage 2 chronic kidney disease, with long-term current use of insulin  (HCC) 08/25/2015   Type II or unspecified type diabetes mellitus with renal manifestations, uncontrolled 07/07/2016   Past Surgical History:  Procedure Laterality Date   BREAST LUMPECTOMY WITH RADIOACTIVE SEED LOCALIZATION Right 08/02/2017   Procedure: BREAST LUMPECTOMY WITH RADIOACTIVE SEED LOCALIZATION;  Surgeon: Sim Dryer, MD;  Location: [facility] OR;  Service: General;  Laterality: Right;   CARPAL TUNNEL RELEASE     CATARACT EXTRACTION W/ INTRAOCULAR LENS IMPLANT Bilateral    CHOLECYSTECTOMY     HERNIA REPAIR     ORIF ANKLE FRACTURE Left 02/21/2014   Procedure: OPEN REDUCTION INTERNAL FIXATION (ORIF) LEFT  ANKLE FRACTURE;  Surgeon: Florencia Hunter, MD;  Location: [facility];  Service: Orthopedics;  Laterality: Left;   PACEMAKER INSERTION  09/24/2013   TOTAL HIP ARTHROPLASTY Left    Bursa cut   TOTAL KNEE ARTHROPLASTY Right 08/31/2021   Procedure: TOTAL KNEE ARTHROPLASTY;  Surgeon: Christie Cox, MD;  Location: [facility];  Service: Orthopedics;  Laterality: Right;   TUBAL LIGATION     Allergies  Allergen Reactions   Meperidine  Nausea And Vomiting and Other (See Comments)    Was given patch behind her ear and was better   Hydrocodone-Acetaminophen  Other (See Comments)    REACTION: Patient states she doesnt break in hives or anything. Just feels bad when she takes it.     Physical Exam: Palpable pedal pulses.  The area of the left submet 2 skin lesion has evidence of dried blister in the area.  No surrounding erythema or active fluid is noted.  There is minimal pain on palpation of the area.  Assessment/Plan of Care: 1. Benign neoplasm of skin of left foot     The dry blister tissue was debrided with a sterile #313 blade.  Once this  was removed there were no signs of recurrence of the lesion.  No further treatment was needed today.  Patient will follow-up as needed   Joe Murders, DPM, FACFAS [facility]     [address] [place], [place] [address]                Office [phone]  Fax [phone]

## 2023-07-20 ENCOUNTER — Ambulatory Visit (INDEPENDENT_AMBULATORY_CARE_PROVIDER_SITE_OTHER): Payer: PPO

## 2023-07-20 DIAGNOSIS — I495 Sick sinus syndrome: Secondary | ICD-10-CM | POA: Diagnosis not present

## 2023-07-20 LAB — CUP PACEART REMOTE DEVICE CHECK
Battery Impedance: 995 Ohm
Battery Remaining Longevity: 72 mo
Battery Voltage: 2.78 V
Brady Statistic AP VP Percent: 1 %
Brady Statistic AP VS Percent: 38 %
Brady Statistic AS VP Percent: 0 %
Brady Statistic AS VS Percent: 60 %
Date Time Interrogation Session: 20250623164345
Implantable Lead Connection Status: 753985
Implantable Lead Connection Status: 753985
Implantable Lead Implant Date: 20150831
Implantable Lead Implant Date: 20150831
Implantable Lead Location: 753859
Implantable Lead Location: 753860
Implantable Lead Model: 4092
Implantable Lead Model: 5076
Implantable Pulse Generator Implant Date: 20150831
Lead Channel Impedance Value: 517 Ohm
Lead Channel Impedance Value: 629 Ohm
Lead Channel Pacing Threshold Amplitude: 0.625 V
Lead Channel Pacing Threshold Amplitude: 1.75 V
Lead Channel Pacing Threshold Pulse Width: 0.4 ms
Lead Channel Pacing Threshold Pulse Width: 0.4 ms
Lead Channel Setting Pacing Amplitude: 1.5 V
Lead Channel Setting Pacing Amplitude: 3.5 V
Lead Channel Setting Pacing Pulse Width: 0.4 ms
Lead Channel Setting Sensing Sensitivity: 2 mV
Zone Setting Status: 755011
Zone Setting Status: 755011

## 2023-07-24 ENCOUNTER — Ambulatory Visit: Payer: Self-pay | Admitting: Cardiology

## 2023-08-07 ENCOUNTER — Other Ambulatory Visit: Payer: Self-pay | Admitting: Cardiology

## 2023-08-23 ENCOUNTER — Other Ambulatory Visit: Payer: Self-pay | Admitting: Cardiology

## 2023-08-23 NOTE — Telephone Encounter (Signed)
 Prescription refill request for Eliquis  received. Indication:afib Last office visit:12/24 Scr:1.04  4/25 Age: 73 Weight: 90.3  kg  Prescription refilled

## 2023-10-12 NOTE — Progress Notes (Signed)
 Remote pacemaker transmission.

## 2023-10-19 ENCOUNTER — Ambulatory Visit: Payer: PPO

## 2023-10-19 DIAGNOSIS — I495 Sick sinus syndrome: Secondary | ICD-10-CM | POA: Diagnosis not present

## 2023-10-20 LAB — CUP PACEART REMOTE DEVICE CHECK
Battery Impedance: 1154 Ohm
Battery Remaining Longevity: 61 mo
Battery Voltage: 2.76 V
Brady Statistic AP VP Percent: 1 %
Brady Statistic AP VS Percent: 41 %
Brady Statistic AS VP Percent: 0 %
Brady Statistic AS VS Percent: 57 %
Date Time Interrogation Session: 20250923094848
Implantable Lead Connection Status: 753985
Implantable Lead Connection Status: 753985
Implantable Lead Implant Date: 20150831
Implantable Lead Implant Date: 20150831
Implantable Lead Location: 753859
Implantable Lead Location: 753860
Implantable Lead Model: 4092
Implantable Lead Model: 5076
Implantable Pulse Generator Implant Date: 20150831
Lead Channel Impedance Value: 525 Ohm
Lead Channel Impedance Value: 565 Ohm
Lead Channel Pacing Threshold Amplitude: 0.625 V
Lead Channel Pacing Threshold Amplitude: 2.25 V
Lead Channel Pacing Threshold Pulse Width: 0.4 ms
Lead Channel Pacing Threshold Pulse Width: 0.4 ms
Lead Channel Setting Pacing Amplitude: 1.5 V
Lead Channel Setting Pacing Amplitude: 4.5 V
Lead Channel Setting Pacing Pulse Width: 0.4 ms
Lead Channel Setting Sensing Sensitivity: 2 mV
Zone Setting Status: 755011
Zone Setting Status: 755011

## 2023-10-21 ENCOUNTER — Ambulatory Visit: Payer: Self-pay | Admitting: Cardiology

## 2023-10-21 NOTE — Progress Notes (Signed)
 Remote PPM Transmission

## 2023-11-02 ENCOUNTER — Ambulatory Visit: Admitting: Internal Medicine

## 2023-11-21 NOTE — Progress Notes (Unsigned)
 Cardiology Office Note:    Date:  11/21/2023   ID:  Carolyn [name], DOB 1950/10/16, MRN [id number]  PCP:  Silver Lamar LABOR, MD  Cardiologist:  Redell Leiter, MD    Referring MD: Silver Lamar LABOR, MD    ASSESSMENT:    1. Sick sinus syndrome (HCC)   2. Pacemaker   3. Paroxysmal atrial fibrillation (HCC)   4. Chronic anticoagulation   5. Hypertensive heart disease with heart failure (HCC)   6. Orthostatic hypotension   7. Elevated LFTs    PLAN:    In order of problems listed above:  Overall Carolyn [name] continues to do well stable arrhythmia pacemaker normal function no recurrent atrial fibrillation and continue her current anticoagulant Yearly labs followed in her PCP office Hypertension controlled currently not on loop diuretic no findings of heart failure on examination she will continue her calcium  channel blocker and beta-blocker. Continue to take midodrine  on a as needed basis has to get in the habit of recording sitting and standing blood pressures Followed by her PCP   Next appointment: 1 year   Medication Adjustments/Labs and Tests Ordered: Current medicines are reviewed at length with the patient today.  Concerns regarding medicines are outlined above.  No orders of the defined types were placed in this encounter.  No orders of the defined types were placed in this encounter.    History of Present Illness:    Carolyn [name] is a 73 y.o. female with a hx of paroxysmal atrial fibrillation with cryoablation 2017 maintaining sinus rhythm hypertension type 2 diabetes hypothyroidism and sick sinus syndrome with dual-chamber pacemaker stage III CKD and symptomatic orthostatic hypotension last seen 11/12/2021.   Recent labs 11/07/2023 CMP with sodium 144 potassium 4.0 creatinine 0.95 GFR 64 cc/min her albumin to creatinine ratio was increased bilirubin was elevated 2.5 And ultrasound liver showing hyperechoic hepatic parenchyma consistent with biliary steatosis. Last  pacemaker check 10/18/2023 with normal device parameters and lead function with an atrial paced ventricular sensed rhythm.  Compliance with diet, lifestyle and medications: Yes  Overall Carolyn [name] continues to do well recently has had episodes again of orthostatic symptoms and started taking midodrine  with control She is not having edema shortness of breath or syncope no chest pain Stable pacemaker device function recent labs November 2024 cholesterol 136 LDL 69 A1c 5.7 hemoglobin 85.2 creatinine 1.03 potassium 4.3 Past Medical History:  Diagnosis Date   Abnormal mammogram 05/10/2017   Acquired hypothyroidism 07/07/2016   Acute gastritis 07/07/2016   Acute pain of right knee 09/20/2019   AKI (acute kidney injury) 07/25/2020   Allergic rhinitis 07/07/2016   Anemia 07/07/2016   Anxiety 07/07/2016   Arthritis    Osteoarthritis   ATRIAL FIBRILLATION 11/27/2008   Qualifier: Diagnosis of  By: Laverne PharmD, Sally     Atypical chest pain 07/07/2016   Benign essential hypertension 07/07/2016   Overview:  stable on current meds   Benign paroxysmal positional vertigo 07/07/2016   Overview:  Avoid rapid headmovement and position change. May taper the gabapentin  to see if helps.   Bilateral low back pain without sciatica 07/07/2016   Overview:  continue gabapentin  and meloxicam,   Bimalleolar ankle fracture 02/21/2014   Bronchitis 07/07/2016   Carpal tunnel syndrome    CHF (congestive heart failure) (HCC)    08-2013   Chronic anticoagulation 12/03/2014   Chronic diastolic heart failure (HCC) 12/03/2014   CKD (chronic kidney disease) stage 3, GFR 30-59 ml/min (HCC) 11/21/2014   COPD (chronic obstructive pulmonary  disease) (HCC) 07/07/2016   Diarrhea due to malabsorption 08/17/2017   DM2 (diabetes mellitus, type 2) (HCC)    Edema, peripheral 07/07/2016   Overview:  increase lasix  to 40 mg bid   Encounter for annual physical examination excluding gynecological examination in a patient older than  17 years 03/17/2019   Enthesopathy 07/07/2016   Esophagitis, reflux 07/07/2016   Essential hypertension 05/06/2015   Functional diarrhea 07/07/2016   GERD (gastroesophageal reflux disease)    Great toe pain, left 05/25/2018   Headache 07/07/2016   Heart failure (HCC)    History of diverticulosis 07/07/2016   HLD (hyperlipidemia)    HTN (hypertension)    Hyperlipidemia 05/06/2015   Hypertensive heart disease 07/07/2016   Overview:  stable on current meds   Hypertensive heart disease with heart failure (HCC) 12/03/2014   Hypothyroid 07/07/2016   Hypothyroidism    Irritable bowel syndrome 07/07/2016   Overview:  IBS information sheet provided,  dietary changes suggested   Low back pain 07/07/2016   Overview:  l[facility]bar spine films at [facility]   Major depressive disorder, recurrent, moderate (HCC) 07/07/2016   Microalbuminuria due to type 2 diabetes mellitus (HCC) 05/06/2015   Midline low back pain without sciatica 07/07/2016   Overview:  --RTC for warning signs (Fevers, chills, loss of bowel/bladder function, saddle numbness), worsening pain or failure for pain to improve with symptomatic treatment --reviewed natural course of disease that acute LBP takes 4-6 weeks to recover --cont meds listed for symptomatic relief --follow up in 2-4 weeks or earlier if warning signs develop   Mild CAD 04/06/2018   Mixed dyslipidemia 03/17/2019   Nerve pain 07/07/2016   Obesity    Obesity (BMI 30-39.9) 05/06/2015   OSA (obstructive sleep apnea) 07/07/2016   Osteoarthritis 07/07/2016   Other ventral hernia 07/07/2016   Overweight 07/07/2016   Pacemaker 11/21/2014   PAF (paroxysmal atrial fibrillation) (HCC)    Pain in joint of right hip 03/24/2018   Pain of left hand 05/07/2017   Paroxysmal atrial fibrillation (HCC) 11/27/2008   CHADS2 vasc=4    Presence of permanent cardiac pacemaker    09-24-13 01-21-14 Last interrogation in [facility]   Rhinosinusitis 07/07/2016   S/P total knee  replacement 08/31/2021   Seasonal allergies 07/07/2016   Severe obesity (BMI 35.0-39.9) with comorbidity (HCC) 05/12/2018   Shortness of breath 04/11/2019   Sleep apnea    No cpap used   Smoker 07/07/2016   SSS (sick sinus syndrome) (HCC) 11/21/2014   Symptomatic menopausal or female climacteric states 07/07/2016   Thoracic back pain 07/07/2016   Type 2 diabetes mellitus with stage 2 chronic kidney disease, with long-term current use of insulin  (HCC) 08/25/2015   Type II or unspecified type diabetes mellitus with renal manifestations, uncontrolled 07/07/2016    Current Medications: No outpatient medications have been marked as taking for the 11/22/23 encounter (Appointment) with Monetta Redell PARAS, MD.      EKGs/Labs/Other Studies Reviewed:    The following studies were reviewed today:       EKG Interpretation Date/Time:  Tuesday November 22 2023 10:18:08 EDT Ventricular Rate:  70 PR Interval:  312 QRS Duration:  84 QT Interval:  396 QTC Calculation: 427 R Axis:   36  Text Interpretation: Atrial-paced rhythm with prolonged AV conduction Abnormal ECG When compared with ECG of 26-Nov-2022 08:24, Electronic atrial pacemaker has replaced Sinus rhythm Confirmed by Monetta Redell ([id number]) on 11/22/2023 10:23:32 AM    Recent Labs: 11/26/2022: ALT 64; BUN  15; Creatinine, Ser 1.03; Potassium 4.3; Sodium 144  Recent Lipid Panel    Component Value Date/Time   CHOL 136 11/26/2022 0855   TRIG 175 (H) 11/26/2022 0855   HDL 37 (L) 11/26/2022 0855   CHOLHDL 3.7 11/26/2022 0855   LDLCALC 69 11/26/2022 0855    Physical Exam:    VS:  LMP 02/26/2005     Wt Readings from Last 3 Encounters:  01/03/23 199 lb 1.9 oz (90.3 kg)  11/26/22 193 lb 12.8 oz (87.9 kg)  11/30/21 193 lb (87.5 kg)     GEN:  Well nourished, well developed in no acute distress HEENT: Normal NECK: No JVD; No carotid bruits LYMPHATICS: No lymphadenopathy CARDIAC: RRR, no murmurs, rubs, gallops RESPIRATORY:  Clear  to auscultation without rales, wheezing or rhonchi  ABDOMEN: Soft, non-tender, non-distended MUSCULOSKELETAL:  No edema; No deformity  SKIN: Warm and dry NEUROLOGIC:  Alert and oriented x 3 PSYCHIATRIC:  Normal affect    Signed, Redell Leiter, MD  11/21/2023 8:45 PM    [facility]

## 2023-11-22 ENCOUNTER — Encounter: Payer: Self-pay | Admitting: Cardiology

## 2023-11-22 ENCOUNTER — Ambulatory Visit: Attending: Cardiology | Admitting: Cardiology

## 2023-11-22 VITALS — BP 120/70 | HR 70 | Ht 63.6 in | Wt 200.4 lb

## 2023-11-22 DIAGNOSIS — Z7901 Long term (current) use of anticoagulants: Secondary | ICD-10-CM | POA: Diagnosis not present

## 2023-11-22 DIAGNOSIS — I110 Hypertensive heart disease with heart failure: Secondary | ICD-10-CM

## 2023-11-22 DIAGNOSIS — I951 Orthostatic hypotension: Secondary | ICD-10-CM

## 2023-11-22 DIAGNOSIS — I495 Sick sinus syndrome: Secondary | ICD-10-CM

## 2023-11-22 DIAGNOSIS — R7989 Other specified abnormal findings of blood chemistry: Secondary | ICD-10-CM

## 2023-11-22 DIAGNOSIS — Z950 Presence of cardiac pacemaker: Secondary | ICD-10-CM | POA: Diagnosis not present

## 2023-11-22 DIAGNOSIS — I480 Paroxysmal atrial fibrillation: Secondary | ICD-10-CM

## 2023-11-22 NOTE — Patient Instructions (Signed)

## 2023-11-25 ENCOUNTER — Ambulatory Visit: Admitting: Podiatry

## 2023-11-25 DIAGNOSIS — D2372 Other benign neoplasm of skin of left lower limb, including hip: Secondary | ICD-10-CM

## 2023-11-25 NOTE — Progress Notes (Signed)
 Chief Complaint  Patient presents with   Callouses    Left lateral callous at Sub-met 5.  Does not look like its infected. It is causing pain while walking.  Eliquis    HPI: 73 y.o. female presents today with concern of painful callus left submet 5.  She is not sure if this needs treated with the Cantharone like her submet 2 lesion was treated.  She has no recurrence of that lesion at this time.  Past Medical History:  Diagnosis Date   Abnormal mammogram 05/10/2017   Acquired hypothyroidism 07/07/2016   Acute gastritis 07/07/2016   Acute pain of right knee 09/20/2019   AKI (acute kidney injury) 07/25/2020   Allergic rhinitis 07/07/2016   Anemia 07/07/2016   Anxiety 07/07/2016   Arthritis    Osteoarthritis   ATRIAL FIBRILLATION 11/27/2008   Qualifier: Diagnosis of  By: Laverne PharmD, Sally     Atypical chest pain 07/07/2016   Benign essential hypertension 07/07/2016   Overview:  stable on current meds   Benign paroxysmal positional vertigo 07/07/2016   Overview:  Avoid rapid headmovement and position change. May taper the gabapentin  to see if helps.   Bilateral low back pain without sciatica 07/07/2016   Overview:  continue gabapentin  and meloxicam,   Bimalleolar ankle fracture 02/21/2014   Bronchitis 07/07/2016   Carpal tunnel syndrome    CHF (congestive heart failure) (HCC)    08-2013   Chronic anticoagulation 12/03/2014   Chronic diastolic heart failure (HCC) 12/03/2014   CKD (chronic kidney disease) stage 3, GFR 30-59 ml/min (HCC) 11/21/2014   COPD (chronic obstructive pulmonary disease) (HCC) 07/07/2016   Diarrhea due to malabsorption 08/17/2017   DM2 (diabetes mellitus, type 2) (HCC)    Edema, peripheral 07/07/2016   Overview:  increase lasix  to 40 mg bid   Encounter for annual physical examination excluding gynecological examination in a patient older than 17 years 03/17/2019   Enthesopathy 07/07/2016   Esophagitis, reflux 07/07/2016   Essential hypertension  05/06/2015   Functional diarrhea 07/07/2016   GERD (gastroesophageal reflux disease)    Great toe pain, left 05/25/2018   Headache 07/07/2016   Heart failure (HCC)    History of diverticulosis 07/07/2016   HLD (hyperlipidemia)    HTN (hypertension)    Hyperlipidemia 05/06/2015   Hypertensive heart disease 07/07/2016   Overview:  stable on current meds   Hypertensive heart disease with heart failure (HCC) 12/03/2014   Hypothyroid 07/07/2016   Hypothyroidism    Irritable bowel syndrome 07/07/2016   Overview:  IBS information sheet provided,  dietary changes suggested   Low back pain 07/07/2016   Overview:  l[facility]bar spine films at [facility]   Major depressive disorder, recurrent, moderate (HCC) 07/07/2016   Microalbuminuria due to type 2 diabetes mellitus (HCC) 05/06/2015   Midline low back pain without sciatica 07/07/2016   Overview:  --RTC for warning signs (Fevers, chills, loss of bowel/bladder function, saddle numbness), worsening pain or failure for pain to improve with symptomatic treatment --reviewed natural course of disease that acute LBP takes 4-6 weeks to recover --cont meds listed for symptomatic relief --follow up in 2-4 weeks or earlier if warning signs develop   Mild CAD 04/06/2018   Mixed dyslipidemia 03/17/2019   Nerve pain 07/07/2016   Obesity    Obesity (BMI 30-39.9) 05/06/2015   OSA (obstructive sleep apnea) 07/07/2016   Osteoarthritis 07/07/2016   Other ventral hernia 07/07/2016   Overweight 07/07/2016   Pacemaker 11/21/2014   PAF (paroxysmal atrial fibrillation) (  HCC)    Pain in joint of right hip 03/24/2018   Pain of left hand 05/07/2017   Paroxysmal atrial fibrillation (HCC) 11/27/2008   CHADS2 vasc=4    Presence of permanent cardiac pacemaker    09-24-13 01-21-14 Last interrogation in Epic at [facility]   Rhinosinusitis 07/07/2016   S/P total knee replacement 08/31/2021   Seasonal allergies 07/07/2016   Severe obesity (BMI 35.0-39.9) with comorbidity  (HCC) 05/12/2018   Shortness of breath 04/11/2019   Sleep apnea    No cpap used   Smoker 07/07/2016   SSS (sick sinus syndrome) (HCC) 11/21/2014   Symptomatic menopausal or female climacteric states 07/07/2016   Thoracic back pain 07/07/2016   Type 2 diabetes mellitus with stage 2 chronic kidney disease, with long-term current use of insulin  (HCC) 08/25/2015   Type II or unspecified type diabetes mellitus with renal manifestations, uncontrolled 07/07/2016   Past Surgical History:  Procedure Laterality Date   BREAST LUMPECTOMY WITH RADIOACTIVE SEED LOCALIZATION Right 08/02/2017   Procedure: BREAST LUMPECTOMY WITH RADIOACTIVE SEED LOCALIZATION;  Surgeon: Vanderbilt Ned, MD;  Location: [facility] OR;  Service: General;  Laterality: Right;   CARPAL TUNNEL RELEASE     CATARACT EXTRACTION W/ INTRAOCULAR LENS IMPLANT Bilateral    CHOLECYSTECTOMY     HERNIA REPAIR     ORIF ANKLE FRACTURE Left 02/21/2014   Procedure: OPEN REDUCTION INTERNAL FIXATION (ORIF) LEFT  ANKLE FRACTURE;  Surgeon: Tanda DELENA Heading, MD;  Location: [facility] ORS;  Service: Orthopedics;  Laterality: Left;   PACEMAKER INSERTION  09/24/2013   TOTAL HIP ARTHROPLASTY Left    Bursa cut   TOTAL KNEE ARTHROPLASTY Right 08/31/2021   Procedure: TOTAL KNEE ARTHROPLASTY;  Surgeon: Rubie Kemps, MD;  Location: [facility] ORS;  Service: Orthopedics;  Laterality: Right;   TUBAL LIGATION     Allergies  Allergen Reactions   Meperidine  Nausea And Vomiting and Other (See Comments)    Was given patch behind her ear and was better   Hydrocodone-Acetaminophen  Other (See Comments)    REACTION: Patient states she doesnt break in hives or anything. Just feels bad when she takes it.     Physical Exam: Palpable pedal pulses noted.  There is a diffuse area of hyperkeratosis on the left submet 5 area.  No focal corn or punctate lesion is seen within the callus.  This was shaved uneventfully.  There is no surrounding erythema or soft tissue breakdown noted.  Epicritic  sensation intact.  Slightly plantarflexed fifth metatarsal on the left.  Assessment/Plan of Care: 1. Benign neoplasm of skin of left foot     Discussed findings with the patient today.  The hyperkeratotic lesion was shaved uneventfully with a sterile #[id number] blade.  Discussed offloading and shoe fit.  Follow-up as needed   Awanda CHARM Imperial, DPM, FACFAS [facility]     [address] [place], [place] [address]                Office [phone]  Fax [phone]

## 2023-12-05 ENCOUNTER — Encounter: Payer: Self-pay | Admitting: Cardiology

## 2023-12-14 ENCOUNTER — Other Ambulatory Visit: Payer: Self-pay | Admitting: Cardiology

## 2024-01-20 ENCOUNTER — Ambulatory Visit (INDEPENDENT_AMBULATORY_CARE_PROVIDER_SITE_OTHER): Payer: PPO

## 2024-01-20 DIAGNOSIS — I495 Sick sinus syndrome: Secondary | ICD-10-CM

## 2024-01-23 LAB — CUP PACEART REMOTE DEVICE CHECK
Battery Impedance: 1209 Ohm
Battery Remaining Longevity: 46 mo
Battery Voltage: 2.75 V
Brady Statistic AP VP Percent: 1 %
Brady Statistic AP VS Percent: 43 %
Brady Statistic AS VP Percent: 0 %
Brady Statistic AS VS Percent: 56 %
Date Time Interrogation Session: 20251227073159
Implantable Lead Connection Status: 753985
Implantable Lead Connection Status: 753985
Implantable Lead Implant Date: 20150831
Implantable Lead Implant Date: 20150831
Implantable Lead Location: 753859
Implantable Lead Location: 753860
Implantable Lead Model: 4092
Implantable Lead Model: 5076
Implantable Pulse Generator Implant Date: 20150831
Lead Channel Impedance Value: 503 Ohm
Lead Channel Impedance Value: 584 Ohm
Lead Channel Pacing Threshold Amplitude: 0.625 V
Lead Channel Pacing Threshold Pulse Width: 0.4 ms
Lead Channel Setting Pacing Amplitude: 1.5 V
Lead Channel Setting Pacing Amplitude: 5 V
Lead Channel Setting Pacing Pulse Width: 1 ms
Lead Channel Setting Sensing Sensitivity: 2 mV
Zone Setting Status: 755011
Zone Setting Status: 755011

## 2024-01-24 ENCOUNTER — Ambulatory Visit: Payer: Self-pay | Admitting: Cardiology

## 2024-01-25 NOTE — Progress Notes (Signed)
 Remote PPM Transmission

## 2024-02-03 ENCOUNTER — Other Ambulatory Visit: Payer: Self-pay | Admitting: Cardiology
